# Patient Record
Sex: Female | Born: 1935 | Race: White | Hispanic: No | Smoking: Former smoker
Health system: Southern US, Community
[De-identification: ages and names within clinical notes are randomized; demographics above are authoritative.]

## PROBLEM LIST (undated history)

## (undated) DIAGNOSIS — I2510 Atherosclerotic heart disease of native coronary artery without angina pectoris: Secondary | ICD-10-CM

## (undated) DIAGNOSIS — G459 Transient cerebral ischemic attack, unspecified: Secondary | ICD-10-CM

## (undated) DIAGNOSIS — K279 Peptic ulcer, site unspecified, unspecified as acute or chronic, without hemorrhage or perforation: Secondary | ICD-10-CM

## (undated) DIAGNOSIS — K589 Irritable bowel syndrome without diarrhea: Secondary | ICD-10-CM

## (undated) DIAGNOSIS — R0789 Other chest pain: Secondary | ICD-10-CM

## (undated) DIAGNOSIS — S72001A Fracture of unspecified part of neck of right femur, initial encounter for closed fracture: Secondary | ICD-10-CM

## (undated) DIAGNOSIS — I501 Left ventricular failure: Secondary | ICD-10-CM

## (undated) DIAGNOSIS — M1990 Unspecified osteoarthritis, unspecified site: Secondary | ICD-10-CM

## (undated) DIAGNOSIS — I4891 Unspecified atrial fibrillation: Secondary | ICD-10-CM

## (undated) DIAGNOSIS — J449 Chronic obstructive pulmonary disease, unspecified: Secondary | ICD-10-CM

## (undated) DIAGNOSIS — I10 Essential (primary) hypertension: Secondary | ICD-10-CM

## (undated) DIAGNOSIS — R630 Anorexia: Secondary | ICD-10-CM

## (undated) DIAGNOSIS — I495 Sick sinus syndrome: Secondary | ICD-10-CM

## (undated) DIAGNOSIS — K625 Hemorrhage of anus and rectum: Secondary | ICD-10-CM

## (undated) DIAGNOSIS — H3530 Unspecified macular degeneration: Secondary | ICD-10-CM

## (undated) DIAGNOSIS — I5032 Chronic diastolic (congestive) heart failure: Secondary | ICD-10-CM

## (undated) DIAGNOSIS — K219 Gastro-esophageal reflux disease without esophagitis: Secondary | ICD-10-CM

## (undated) DIAGNOSIS — M8580 Other specified disorders of bone density and structure, unspecified site: Secondary | ICD-10-CM

## (undated) DIAGNOSIS — K2970 Gastritis, unspecified, without bleeding: Secondary | ICD-10-CM

## (undated) DIAGNOSIS — R609 Edema, unspecified: Secondary | ICD-10-CM

## (undated) DIAGNOSIS — E162 Hypoglycemia, unspecified: Secondary | ICD-10-CM

## (undated) DIAGNOSIS — R0600 Dyspnea, unspecified: Secondary | ICD-10-CM

## (undated) DIAGNOSIS — J4489 Other specified chronic obstructive pulmonary disease: Secondary | ICD-10-CM

## (undated) DIAGNOSIS — E538 Deficiency of other specified B group vitamins: Secondary | ICD-10-CM

## (undated) DIAGNOSIS — E785 Hyperlipidemia, unspecified: Secondary | ICD-10-CM

## (undated) DIAGNOSIS — I4892 Unspecified atrial flutter: Secondary | ICD-10-CM

## (undated) DIAGNOSIS — R627 Adult failure to thrive: Principal | ICD-10-CM

## (undated) DIAGNOSIS — R05 Cough: Secondary | ICD-10-CM

## (undated) HISTORY — DX: Other specified chronic obstructive pulmonary disease: J44.89

## (undated) HISTORY — DX: Unspecified atrial flutter: I48.92

## (undated) HISTORY — DX: Left ventricular failure, unspecified: I50.1

## (undated) HISTORY — DX: Fracture of unspecified part of neck of right femur, initial encounter for closed fracture: S72.001A

## (undated) HISTORY — DX: Sick sinus syndrome: I49.5

## (undated) HISTORY — PX: CATARACT EXTRACTION: SUR2

## (undated) HISTORY — PX: OTHER SURGICAL HISTORY: SHX169

## (undated) HISTORY — PX: HIP SURGERY: SHX245

## (undated) HISTORY — PX: ORIF HIP FRACTURE: SHX2125

## (undated) HISTORY — DX: Gastro-esophageal reflux disease without esophagitis: K21.9

## (undated) HISTORY — DX: Chronic diastolic (congestive) heart failure: I50.32

## (undated) HISTORY — DX: Chronic obstructive pulmonary disease, unspecified: J44.9

## (undated) HISTORY — DX: Other chest pain: R07.89

## (undated) HISTORY — DX: Irritable bowel syndrome, unspecified: K58.9

## (undated) HISTORY — PX: EYE SURGERY: SHX253

## (undated) HISTORY — DX: Anorexia: R63.0

## (undated) HISTORY — DX: Adult failure to thrive: R62.7

## (undated) HISTORY — DX: Hemorrhage of anus and rectum: K62.5

## (undated) HISTORY — PX: VARICOSE VEIN SURGERY: SHX832

## (undated) HISTORY — DX: Atherosclerotic heart disease of native coronary artery without angina pectoris: I25.10

## (undated) HISTORY — DX: Dyspnea, unspecified: R06.00

---

## 1992-05-14 DIAGNOSIS — G459 Transient cerebral ischemic attack, unspecified: Secondary | ICD-10-CM

## 1992-05-14 HISTORY — DX: Transient cerebral ischemic attack, unspecified: G45.9

## 2005-09-21 ENCOUNTER — Ambulatory Visit (HOSPITAL_COMMUNITY): Admission: RE | Admit: 2005-09-21 | Discharge: 2005-09-21 | Payer: Self-pay | Admitting: Internal Medicine

## 2005-09-21 ENCOUNTER — Encounter: Payer: Self-pay | Admitting: Vascular Surgery

## 2005-09-25 ENCOUNTER — Encounter: Admission: RE | Admit: 2005-09-25 | Discharge: 2005-09-25 | Payer: Self-pay | Admitting: Internal Medicine

## 2005-10-03 ENCOUNTER — Encounter: Admission: RE | Admit: 2005-10-03 | Discharge: 2005-10-03 | Payer: Self-pay | Admitting: *Deleted

## 2005-10-09 ENCOUNTER — Encounter (INDEPENDENT_AMBULATORY_CARE_PROVIDER_SITE_OTHER): Payer: Self-pay | Admitting: *Deleted

## 2005-10-09 ENCOUNTER — Ambulatory Visit (HOSPITAL_COMMUNITY): Admission: RE | Admit: 2005-10-09 | Discharge: 2005-10-09 | Payer: Self-pay | Admitting: *Deleted

## 2005-12-11 ENCOUNTER — Encounter (INDEPENDENT_AMBULATORY_CARE_PROVIDER_SITE_OTHER): Payer: Self-pay | Admitting: Specialist

## 2005-12-11 ENCOUNTER — Ambulatory Visit (HOSPITAL_COMMUNITY): Admission: RE | Admit: 2005-12-11 | Discharge: 2005-12-11 | Payer: Self-pay | Admitting: *Deleted

## 2006-01-24 ENCOUNTER — Encounter (INDEPENDENT_AMBULATORY_CARE_PROVIDER_SITE_OTHER): Payer: Self-pay | Admitting: Cardiology

## 2006-01-24 ENCOUNTER — Encounter (INDEPENDENT_AMBULATORY_CARE_PROVIDER_SITE_OTHER): Payer: Self-pay | Admitting: Neurology

## 2006-01-24 ENCOUNTER — Inpatient Hospital Stay (HOSPITAL_COMMUNITY): Admission: EM | Admit: 2006-01-24 | Discharge: 2006-01-28 | Payer: Self-pay | Admitting: Emergency Medicine

## 2006-05-29 ENCOUNTER — Ambulatory Visit (HOSPITAL_COMMUNITY): Admission: RE | Admit: 2006-05-29 | Discharge: 2006-05-29 | Payer: Self-pay | Admitting: Internal Medicine

## 2006-07-17 ENCOUNTER — Inpatient Hospital Stay (HOSPITAL_COMMUNITY): Admission: EM | Admit: 2006-07-17 | Discharge: 2006-07-18 | Payer: Self-pay | Admitting: Emergency Medicine

## 2006-07-17 ENCOUNTER — Ambulatory Visit: Payer: Self-pay | Admitting: Cardiology

## 2006-07-18 ENCOUNTER — Ambulatory Visit: Payer: Self-pay | Admitting: Vascular Surgery

## 2006-07-18 ENCOUNTER — Encounter: Payer: Self-pay | Admitting: Vascular Surgery

## 2006-07-18 ENCOUNTER — Encounter: Payer: Self-pay | Admitting: Cardiology

## 2006-10-24 ENCOUNTER — Inpatient Hospital Stay (HOSPITAL_COMMUNITY): Admission: EM | Admit: 2006-10-24 | Discharge: 2006-10-31 | Payer: Self-pay | Admitting: Emergency Medicine

## 2006-11-28 ENCOUNTER — Encounter (HOSPITAL_COMMUNITY): Admission: RE | Admit: 2006-11-28 | Discharge: 2006-12-16 | Payer: Self-pay | Admitting: Interventional Cardiology

## 2006-12-18 ENCOUNTER — Inpatient Hospital Stay (HOSPITAL_COMMUNITY): Admission: AD | Admit: 2006-12-18 | Discharge: 2006-12-25 | Payer: Self-pay | Admitting: Interventional Cardiology

## 2007-02-22 ENCOUNTER — Emergency Department (HOSPITAL_COMMUNITY): Admission: EM | Admit: 2007-02-22 | Discharge: 2007-02-22 | Payer: Self-pay | Admitting: Emergency Medicine

## 2007-05-31 ENCOUNTER — Emergency Department (HOSPITAL_COMMUNITY): Admission: EM | Admit: 2007-05-31 | Discharge: 2007-05-31 | Payer: Self-pay | Admitting: Emergency Medicine

## 2007-06-15 ENCOUNTER — Inpatient Hospital Stay (HOSPITAL_COMMUNITY): Admission: EM | Admit: 2007-06-15 | Discharge: 2007-06-16 | Payer: Self-pay | Admitting: Emergency Medicine

## 2007-07-04 ENCOUNTER — Inpatient Hospital Stay (HOSPITAL_COMMUNITY): Admission: EM | Admit: 2007-07-04 | Discharge: 2007-07-08 | Payer: Self-pay | Admitting: Emergency Medicine

## 2007-09-15 ENCOUNTER — Encounter: Admission: RE | Admit: 2007-09-15 | Discharge: 2007-09-15 | Payer: Self-pay | Admitting: Internal Medicine

## 2007-12-15 ENCOUNTER — Ambulatory Visit (HOSPITAL_COMMUNITY): Admission: RE | Admit: 2007-12-15 | Discharge: 2007-12-15 | Payer: Self-pay | Admitting: Internal Medicine

## 2008-02-12 ENCOUNTER — Inpatient Hospital Stay (HOSPITAL_COMMUNITY): Admission: EM | Admit: 2008-02-12 | Discharge: 2008-02-16 | Payer: Self-pay | Admitting: Emergency Medicine

## 2008-03-05 ENCOUNTER — Encounter (HOSPITAL_COMMUNITY): Admission: RE | Admit: 2008-03-05 | Discharge: 2008-05-13 | Payer: Self-pay | Admitting: Internal Medicine

## 2008-11-11 ENCOUNTER — Encounter: Admission: RE | Admit: 2008-11-11 | Discharge: 2008-11-11 | Payer: Self-pay | Admitting: Internal Medicine

## 2009-03-07 ENCOUNTER — Inpatient Hospital Stay (HOSPITAL_COMMUNITY): Admission: AD | Admit: 2009-03-07 | Discharge: 2009-03-08 | Payer: Self-pay | Admitting: Interventional Cardiology

## 2009-03-12 ENCOUNTER — Ambulatory Visit: Payer: Self-pay | Admitting: Internal Medicine

## 2009-03-12 ENCOUNTER — Inpatient Hospital Stay (HOSPITAL_COMMUNITY): Admission: EM | Admit: 2009-03-12 | Discharge: 2009-03-20 | Payer: Self-pay | Admitting: Emergency Medicine

## 2009-03-15 ENCOUNTER — Encounter: Payer: Self-pay | Admitting: Internal Medicine

## 2009-03-18 HISTORY — PX: PACEMAKER INSERTION: SHX728

## 2009-03-18 HISTORY — PX: CARDIAC CATHETERIZATION: SHX172

## 2009-03-22 ENCOUNTER — Encounter: Payer: Self-pay | Admitting: Internal Medicine

## 2009-03-22 DIAGNOSIS — Z950 Presence of cardiac pacemaker: Secondary | ICD-10-CM | POA: Insufficient documentation

## 2009-03-23 ENCOUNTER — Ambulatory Visit: Payer: Self-pay

## 2009-03-23 ENCOUNTER — Encounter: Payer: Self-pay | Admitting: Internal Medicine

## 2009-04-30 ENCOUNTER — Emergency Department (HOSPITAL_COMMUNITY): Admission: EM | Admit: 2009-04-30 | Discharge: 2009-04-30 | Payer: Self-pay | Admitting: Emergency Medicine

## 2009-06-15 DIAGNOSIS — I2510 Atherosclerotic heart disease of native coronary artery without angina pectoris: Secondary | ICD-10-CM

## 2009-06-15 DIAGNOSIS — I10 Essential (primary) hypertension: Secondary | ICD-10-CM | POA: Insufficient documentation

## 2009-06-15 DIAGNOSIS — I4891 Unspecified atrial fibrillation: Secondary | ICD-10-CM | POA: Insufficient documentation

## 2009-06-15 DIAGNOSIS — I495 Sick sinus syndrome: Secondary | ICD-10-CM

## 2009-06-15 HISTORY — DX: Atherosclerotic heart disease of native coronary artery without angina pectoris: I25.10

## 2009-06-15 HISTORY — DX: Sick sinus syndrome: I49.5

## 2009-06-25 ENCOUNTER — Emergency Department (HOSPITAL_COMMUNITY): Admission: EM | Admit: 2009-06-25 | Discharge: 2009-06-25 | Payer: Self-pay | Admitting: Emergency Medicine

## 2009-07-05 ENCOUNTER — Encounter (INDEPENDENT_AMBULATORY_CARE_PROVIDER_SITE_OTHER): Payer: Self-pay | Admitting: *Deleted

## 2009-12-09 ENCOUNTER — Encounter (INDEPENDENT_AMBULATORY_CARE_PROVIDER_SITE_OTHER): Payer: Self-pay | Admitting: *Deleted

## 2010-02-10 ENCOUNTER — Encounter (INDEPENDENT_AMBULATORY_CARE_PROVIDER_SITE_OTHER): Payer: Self-pay | Admitting: *Deleted

## 2010-06-03 ENCOUNTER — Encounter: Payer: Self-pay | Admitting: Internal Medicine

## 2010-06-04 ENCOUNTER — Encounter: Payer: Self-pay | Admitting: Orthopedic Surgery

## 2010-06-13 NOTE — Letter (Signed)
Summary: Device-Delinquent Check  [facility], Main Office  [address]   [place], [place] [address]   Phone: [phone]  Fax: [phone]     December 09, 2009 MRN: [id number]   [facility] [address] [place], [place]  [address]   Dear Ms. Jenison,  According to our records, you have not had your implanted device checked in the recommended period of time.  We are unable to determine appropriate device function without checking your device on a regular basis.  Please call our office to schedule an appointment as soon as possible.  If you are having your device checked by another physician, please call us so that we may update our records.  Thank you,   [facility]

## 2010-06-13 NOTE — Letter (Signed)
Summary: Appointment - Missed  [facility], Main Office  [address]   [place], [place] [address]   Phone: [phone]  Fax: [phone]     July 05, 2009 MRN: [id number]   [facility] [address] [place], [place]  [address]   Dear Ms. Kever,  Our records indicate you missed your appointment on 06/17/09 with Dr. Johney Frame. It is very important that we reach you to reschedule this appointment. We look forward to participating in your health care needs. Please contact us at the number listed above at your earliest convenience to reschedule this appointment.     Sincerely,   Ruel Favors Scheduling Team

## 2010-06-13 NOTE — Letter (Signed)
Summary: Device-Delinquent Check  [facility], Main Office  [address]   [place], [place] [address]   Phone: [phone]  Fax: [phone]     February 10, 2010 MRN: [id number]   [facility] [address] [place], [place]  [address]   Dear [name],  According to our records, you have not had your implanted device checked in the recommended period of time.  We are unable to determine appropriate device function without checking your device on a regular basis.  Please call our office to schedule an appointment with Dr Ladona Ridgel as soon as possible.  If you are having your device checked by another physician, please call us so that we may update our records.  Thank you,  Letta Moynahan, EMT  February 10, 2010 12:21 PM  [facility]  Certified

## 2010-06-13 NOTE — Cardiovascular Report (Signed)
Summary: Office Visit   Office Visit   Imported By: Roderic Ovens 05/26/2009 16:08:51  _____________________________________________________________________  External Attachment:    Type:   Image     Comment:   External Document

## 2010-07-20 ENCOUNTER — Emergency Department (HOSPITAL_COMMUNITY): Payer: Medicare Other

## 2010-07-20 ENCOUNTER — Inpatient Hospital Stay (HOSPITAL_COMMUNITY): Payer: Medicare Other

## 2010-07-20 ENCOUNTER — Inpatient Hospital Stay (HOSPITAL_COMMUNITY)
Admission: EM | Admit: 2010-07-20 | Discharge: 2010-07-22 | DRG: 872 | Disposition: A | Discharge status: Nursing Home (Includes ICF) | Payer: Medicare Other | Attending: Internal Medicine | Admitting: Internal Medicine

## 2010-07-20 DIAGNOSIS — E785 Hyperlipidemia, unspecified: Secondary | ICD-10-CM | POA: Diagnosis present

## 2010-07-20 DIAGNOSIS — Z9861 Coronary angioplasty status: Secondary | ICD-10-CM

## 2010-07-20 DIAGNOSIS — A419 Sepsis, unspecified organism: Principal | ICD-10-CM | POA: Diagnosis present

## 2010-07-20 DIAGNOSIS — J449 Chronic obstructive pulmonary disease, unspecified: Secondary | ICD-10-CM | POA: Diagnosis present

## 2010-07-20 DIAGNOSIS — K2970 Gastritis, unspecified, without bleeding: Secondary | ICD-10-CM | POA: Diagnosis present

## 2010-07-20 DIAGNOSIS — Z7901 Long term (current) use of anticoagulants: Secondary | ICD-10-CM

## 2010-07-20 DIAGNOSIS — F411 Generalized anxiety disorder: Secondary | ICD-10-CM | POA: Diagnosis present

## 2010-07-20 DIAGNOSIS — I4891 Unspecified atrial fibrillation: Secondary | ICD-10-CM | POA: Diagnosis present

## 2010-07-20 DIAGNOSIS — N390 Urinary tract infection, site not specified: Secondary | ICD-10-CM | POA: Diagnosis present

## 2010-07-20 DIAGNOSIS — Z950 Presence of cardiac pacemaker: Secondary | ICD-10-CM

## 2010-07-20 DIAGNOSIS — D696 Thrombocytopenia, unspecified: Secondary | ICD-10-CM | POA: Diagnosis present

## 2010-07-20 DIAGNOSIS — G894 Chronic pain syndrome: Secondary | ICD-10-CM | POA: Diagnosis present

## 2010-07-20 DIAGNOSIS — I10 Essential (primary) hypertension: Secondary | ICD-10-CM | POA: Diagnosis present

## 2010-07-20 DIAGNOSIS — J4489 Other specified chronic obstructive pulmonary disease: Secondary | ICD-10-CM | POA: Diagnosis present

## 2010-07-20 DIAGNOSIS — I2510 Atherosclerotic heart disease of native coronary artery without angina pectoris: Secondary | ICD-10-CM | POA: Diagnosis present

## 2010-07-20 DIAGNOSIS — K219 Gastro-esophageal reflux disease without esophagitis: Secondary | ICD-10-CM | POA: Diagnosis present

## 2010-07-20 DIAGNOSIS — Z8673 Personal history of transient ischemic attack (TIA), and cerebral infarction without residual deficits: Secondary | ICD-10-CM

## 2010-07-20 DIAGNOSIS — R079 Chest pain, unspecified: Secondary | ICD-10-CM | POA: Diagnosis present

## 2010-07-20 LAB — BASIC METABOLIC PANEL
BUN: 18 mg/dL (ref 6–23)
CO2: 27 mEq/L (ref 19–32)
Chloride: 103 mEq/L (ref 96–112)
Creatinine, Ser: 0.69 mg/dL (ref 0.4–1.2)
Glucose, Bld: 138 mg/dL — ABNORMAL HIGH (ref 70–99)

## 2010-07-20 LAB — CBC
MCH: 31.4 pg (ref 26.0–34.0)
MCHC: 32.5 g/dL (ref 30.0–36.0)
Platelets: 146 10*3/uL — ABNORMAL LOW (ref 150–400)
RDW: 12.9 % (ref 11.5–15.5)

## 2010-07-20 LAB — URINALYSIS, ROUTINE W REFLEX MICROSCOPIC
Bilirubin Urine: NEGATIVE
Glucose, UA: NEGATIVE mg/dL
Nitrite: POSITIVE — AB
Specific Gravity, Urine: 1.023 (ref 1.005–1.030)
pH: 7 (ref 5.0–8.0)

## 2010-07-20 LAB — DIFFERENTIAL
Basophils Absolute: 0 10*3/uL (ref 0.0–0.1)
Basophils Relative: 0 % (ref 0–1)
Eosinophils Absolute: 0 10*3/uL (ref 0.0–0.7)
Monocytes Absolute: 0.4 10*3/uL (ref 0.1–1.0)
Monocytes Relative: 6 % (ref 3–12)

## 2010-07-20 LAB — PROCALCITONIN: Procalcitonin: 0.27 ng/mL

## 2010-07-20 LAB — URINE MICROSCOPIC-ADD ON

## 2010-07-20 LAB — POCT CARDIAC MARKERS

## 2010-07-20 LAB — PROTIME-INR: INR: 2.24 — ABNORMAL HIGH (ref 0.00–1.49)

## 2010-07-20 MED ORDER — IOHEXOL 300 MG/ML  SOLN
50.0000 mL | Freq: Once | INTRAMUSCULAR | Status: AC | PRN
  Administered 2010-07-20: 50 mL via INTRAVENOUS

## 2010-07-21 LAB — BASIC METABOLIC PANEL
Calcium: 8.8 mg/dL (ref 8.4–10.5)
Creatinine, Ser: 0.65 mg/dL (ref 0.4–1.2)
GFR calc Af Amer: >60 mL/min (ref >60)
GFR calc non Af Amer: >60 mL/min (ref >60)

## 2010-07-21 LAB — CBC
HCT: 37.2 % (ref 36.0–46.0)
Platelets: 134 10*3/uL — ABNORMAL LOW (ref 150–400)
RDW: 13.3 % (ref 11.5–15.5)
WBC: 5.3 10*3/uL (ref 4.0–10.5)

## 2010-07-21 LAB — CARDIAC PANEL(CRET KIN+CKTOT+MB+TROPI)
Total CK: 139 U/L (ref 7–177)
Troponin I: 0.03 ng/mL (ref 0.00–0.06)

## 2010-07-21 LAB — URINE CULTURE
Colony Count: >=100000
Culture  Setup Time: 201203081603

## 2010-07-21 LAB — PROTIME-INR: INR: 2.28 — ABNORMAL HIGH (ref 0.00–1.49)

## 2010-07-21 NOTE — Consult Note (Signed)
[name], RILING NO.:  [id number]  MEDICAL RECORD NO.:  [id number]           PATIENT TYPE:  E  LOCATION:  [facility]                         FACILITY:  [facility]  PHYSICIAN:  Vania Rea, M.D. DATE OF BIRTH:  1916-01-09  DATE OF CONSULTATION: DATE OF DISCHARGE:                                CONSULTATION   PRIMARY CARE PHYSICIANS:  Soyla Murphy. Renne Crigler, MD. Lyn Records, MD. with [facility] cardiologist.  CHIEF COMPLAINT:  Chest discomfort and shortness of breath.  HISTORY OF PRESENT ILLNESS:  This is a 77 year old Caucasian lady with a history of coronary artery disease, sinoatrial dysfunction status post pacemaker placement, who also has chronic atrial fibrillation on Coumadin and extended release metoprolol, lives at an assisted living facility and has been in fair health, ambulates with the assistance of a walker, but since yesterday has noted that she does not feel quite right.  She was somewhat short of breath, but other than that could not locate any specific complaints.  She had no fever nor chest pains.  This morning, the patient said she woke up with central chest burning and felt much worse than yesterday.  The burning was located retrosternally, did not radiate, it was not associated with nausea or vomiting. However, she called the nurse, who remarked that she had a high fever and did not look well and the patient was transported to the [facility] Emergency Room for further evaluation.  The patient is on chronic Coumadin for her chronic atrial fibrillation. She has had no nausea or vomiting.  No diarrhea or constipation.  Since arriving to the hospital, she has had something to eat and says she feels much better.  The patient has chronic left lower extremity edema status post vein stripping for varicose veins in her left leg and reports has been no change in the left leg edema, but she has noted increased varicose veins in her right leg for the past  few days, but no swelling.  PAST MEDICAL HISTORY: 1. Sinoatrial dysfunction status post pacemaker placement. 2. Coronary artery disease status post stenting. 3. Chronic atrial fibrillation. 4. Hypertension. 5. History of TIA. 6. History of COPD. 7. History of B12 deficiency. 8. GERD. 9. Hyperlipidemia.  MEDICATIONS: 1. Warfarin 5 mg half to 1 tablet daily as per schedule. 2. Vitamin B12 by injection 15th of each month. 3. Chloraseptic lozenges every 2 hours when necessary for sore throat. 4. Tylenol 650 mg every 4 hours as needed. 5. Tramadol 50 mg every 8 hours as needed for pain. 6. Ventolin by inhaler 2 puffs every 4 hours as needed for shortness     of breath. 7. Sublingual nitroglycerin p.r.n. for chest pain. 8. Advair Diskus 100/50 one puff twice daily. 9. Multivitamins daily. 10.Calcium with vitamin D twice daily. 11.Vitamin D 1000 units twice daily. 12.Singulair 10 mg daily. 13.MiraLax 17 grams daily when necessary. 14.Toprol-XL 100 mg one and half tablet each morning. 15.Atorvastatin 10 mg each morning. 16.Omeprazole 20 mg each morning. 17.Tramadol extended release 100 mg each morning.  ALLERGIES:  TO SULFA, DEMEROL, AMPICILLIN, AND CIPRO.  SOCIAL HISTORY:  No tobacco, alcohol, or illicit  drug use.  She quit tobacco nearly 20 years ago.  She has very supportive children and in fact her son who is healthcare power of attorney is, Jamison Yuhasz, telephone number [phone].  Code status is do not resuscitate.  FAMILY HISTORY:  Unchanged from previous admissions.  REVIEW OF SYSTEMS:  Other than noted above unremarkable.  PHYSICAL EXAM:  GENERAL:  Elderly Caucasian lady, sitting up in bed, somewhat hard of hearing, looks much younger than her stated age. VITALS:  Temperature is 99.2, pulse of 78, respirations 17, blood pressure currently 93/43.  She is saturating 100% on 2 L. HEENT:  Her pupils are round and equal.  Mucous membranes pink, anicteric. NECK:  No  cervical lymphadenopathy nor thyromegaly. CHEST:  She has coarse crackles on the right base. CARDIOVASCULAR SYSTEM:  She has a regular paced rhythm.  Occasional errand beats. ABDOMEN:  Obese, soft, and nontender.  No masses.  She does have abdominal surgical scars and appears to have umbilical hernia surgery. EXTREMITIES:  She has 1+ edema in the left lower extremity and arthritic deformities of the hands, knees, and ankles.  She has no edema of the right lower extremity.  She has marked distended veins in the right leg. SKIN: Clammy, moist, no ulceration or rash. CENTRAL NERVOUS SYSTEM:  Apart from being hard of hearing in the left ear, cranial nerves II through XII are grossly intact and she has no focal lateralizing signs.  LABORATORY DATA:  Her white count is 7.1, hemoglobin 13.6, platelets 146.  She has 86% neutrophils, but absolute neutrophil count is normal at 6.1.  Her sodium is 137, potassium 3.9, chloride 103, CO2 of 27, glucose 138, BUN 18, creatinine 0.7, calcium 9.2.  Her INR is therapeutic at 2.4.  Her cardiac markers are completely normal with undetectable CK-MB and troponins with the myoglobin of 58.  Urinalysis shows specific gravity of 10.02.  The urine is cloudy.  She has 15 ketones, nitrite positive, small amount of leukocyte esterase.  She has no white cells and few bacteria.  Portable chest x-ray shows a moderate apparent cardiomegaly without focal acute findings.  Her EKG actually shows a sinus rhythm without any ST-segment evidence of ischemia.  ASSESSMENT: 1. SIRS versus occult sepsis. Although patient has no white count, she     does have fever, she is very clammy, she is somewhat hypotensive     and her urine is questionable suggestive of urinary tract     infection.  Clinically, she does have crackles in the right base,     but her chest x-ray does not confirm a pneumonia. 2. Coronary artery disease. 3. Atrial fibrillation. 4. Sinoatrial dysfunction  status post pacemaker placement. 5. Gastroesophageal reflux disease. 6. Chronic obstructive pulmonary disease. 7. B12 deficiency. 8. Hyperlipidemia. 9. Possible urinary tract infection.  PLAN: 1. We will give IV fluid and antibiotic support. Will check procalcitonin      and lactic acid, and we will go ahead and get a CT angiogram of the     chest, which help [place] to rule out pneumonia or atypical presentation     of a pulmonary embolus.  We will otherwise continue her home     medications.  If her lactic acid is normal, procalcitonin     confirmed to be negative, and CT angiogram is not indicative of any     serious pathology, then we will continue to manage her on telemetry. 2. Other plans as per orders.     Thayer Ohm  Orvan Falconer, M.D.     LC/MEDQ  D:  07/20/2010  T:  07/20/2010  Job:  [id number]  cc:   Soyla Murphy. Renne Crigler, M.D. Lyn Records, M.D.  Electronically Signed by Vania Rea M.D. on 07/20/2010 11:58:46 PM

## 2010-07-23 NOTE — Discharge Summary (Signed)
[name], [name]             ACCOUNT NO.:  [id number]  MEDICAL RECORD NO.:  [id number]           PATIENT TYPE:  I  LOCATION:  3702                         FACILITY:  [facility]  PHYSICIAN:  [name], M.D.       DATE OF BIRTH:  April 30, 1916  DATE OF ADMISSION:  07/20/2010 DATE OF DISCHARGE:  07/22/2010                              DISCHARGE SUMMARY   PRIMARY CARE PHYSICIAN:  Leslie Murphy. Leslie Crigler, MD  DISCHARGE DIAGNOSES: 1. Sepsis, resolved. 2. Urinary tract infection, improved. 3. Gastroesophageal reflux disease. 4. Sick sinus syndrome status post pacemaker placed. 5. Coronary artery disease without evidence for ongoing ischemic     disease status post bare-metal stent to the right coronary artery     in 2008 and stenting of left anterior descending with a drug-     eluting stent in 2009, and left anterior descending bare-metal     stent in 2008. 6. Chronic obstructive pulmonary disease. 7. Anxiety. 8. Severe bilateral knee osteoarthritis with chronic pain syndrome. 9. B12 deficiency. 10.Chronic anticoagulation for atrial fibrillation predated a sick     sinus syndrome and a pacemaker placement. 11.Hyperlipidemia. 12.Mild thrombocytopenia.  DISCHARGE MEDICATIONS: 1. Omeprazole 20 mg by mouth twice a day. 2. Cefuroxime 500 mg by mouth twice a day with meals for 5 days. 3. Advair Diskus 100/50 one puff inhaled twice a day. 4. Lipitor 10 mg daily. 5. Calcium with vitamin D 600/400 mg 1 tablet twice a day. 6. Vitamins 1 tablet daily. 7. Chloraseptic one lozenge by mouth every 2 hours as needed. 8. Vitamin B12 one ejection subcutaneously monthly. 9. ICaps OTC 1 tablet mouth twice a day. 10.Metoprolol extended release 150 mg daily. 11.Nitroglycerin 0.4 mg under the tongue every 5 minutes as needed for     chest pain. 12.MiraLax 17 g daily. 13.Omega fatty acids 30 mg by mouth twice a day. 14.Singulair 10 mg twice daily. 15.Tramadol extended release 100 mg every  morning. 16.Tylenol 650 mg by mouth every 4 hours as needed for pain. 17.Tylenol Extra Strength 500 mg 2 capsules by mouth three times a day     as needed. 18.Ultram 50 mg every 8 hours as needed for pain. 19.Albuterol 90 mcg 2 puffs inhaled every 4 hours as needed for     shortness of breath. 20.Vitamin D 1000 units by mouth twice a day. 21.Coumadin 5 mg Tuesday, Thursday, Saturday, and Sunday and 2.5 mg     Monday, Wednesday, and Friday.  CONDITION ON DISCHARGE:  [name] was discharged in good condition, alert, oriented in no acute distress, able to return to assisted living friend's home.  She will follow up with Dr. Merri [name] next week.  CONSULTATION THIS ADMISSION:  The patient was seen in evaluation by the physical therapist and occupational therapist and felt that she could return to assisted living facility.  HISTORY AND PHYSICAL:  Refer to dictated H and P, which was done by [name] on July 20, 2010.  HOSPITAL COURSE:  [name], a 77 year old woman with multiple medical problems was admitted from the emergency room with severe chest pain, sepsis-like symptoms, nausea, urinary tract infection.  The patient was placed on telemetry floor, started on intravenous fluids, intravenous antibiotics with [name], and her cardiac rhythm and cardiac enzymes were closely monitored.  The patient was noted to be in a paced rhythm throughout this admission and her cardiac enzymes at 24 hours after admission returned back completely to normal with troponin 0.03 and CK of 139.  The patient improved and by hospital day #2 was able to eat a regular diet.  She was still having some mild chest discomfort.  We felt that her chest discomfort was due to acid reflux exacerbating in the setting of this sepsis urinary tract infection.  [name] was evaluated by the physical therapist and occupational therapist and felt to be functional back to her level of assisted  living facility.  We noted that the patient's Coumadin was therapeutic on admission and throughout this admission and we have not made any adjustment in the Coumadin dosages.  Otherwise, [name] will return back to assisted living to complete 5 more days of oral antibiotics for urinary tract infection and on double dose of proton pump inhibitor, which seemed to have relieved her chest pain symptoms. Upon carefully discussing the situation with [name], I feel that some times she may have panic attacks and anxiety spells.  She though refused treatment with an antidepressant or an anxiolytic.  I encouraged her to discuss her symptoms with her primary care physician upon discharge.     [name], M.D.     SL/MEDQ  D:  07/22/2010  T:  07/22/2010  Job:  [id number]  cc:   [name], M.D. Lyn Records, M.D.  Electronically Signed by [name] M.D. on 07/23/2010 08:56:54 AM

## 2010-08-02 LAB — DIFFERENTIAL
Basophils Absolute: 0 10*3/uL (ref 0.0–0.1)
Basophils Relative: 0 % (ref 0–1)
Eosinophils Relative: 1 % (ref 0–5)
Monocytes Absolute: 0.6 10*3/uL (ref 0.1–1.0)
Monocytes Relative: 8 % (ref 3–12)
Neutro Abs: 5.5 10*3/uL (ref 1.7–7.7)

## 2010-08-02 LAB — CBC
HCT: 35.6 % — ABNORMAL LOW (ref 36.0–46.0)
Hemoglobin: 11.9 g/dL — ABNORMAL LOW (ref 12.0–15.0)
MCHC: 33.3 g/dL (ref 30.0–36.0)
RDW: 14 % (ref 11.5–15.5)

## 2010-08-02 LAB — HEMOCCULT GUIAC POC 1CARD (OFFICE): Fecal Occult Bld: POSITIVE

## 2010-08-02 LAB — COMPREHENSIVE METABOLIC PANEL
AST: 22 U/L (ref 0–37)
Albumin: 3.1 g/dL — ABNORMAL LOW (ref 3.5–5.2)
Alkaline Phosphatase: 60 U/L (ref 39–117)
Chloride: 107 mEq/L (ref 96–112)
GFR calc Af Amer: >60 mL/min (ref >60)
Potassium: 4.1 mEq/L (ref 3.5–5.1)
Sodium: 139 mEq/L (ref 135–145)
Total Bilirubin: 0.9 mg/dL (ref 0.3–1.2)
Total Protein: 6.6 g/dL (ref 6.0–8.3)

## 2010-08-02 LAB — APTT: aPTT: 51 seconds — ABNORMAL HIGH (ref 24–37)

## 2010-08-14 LAB — URINE CULTURE

## 2010-08-14 LAB — CBC
HCT: 34.8 % — ABNORMAL LOW (ref 36.0–46.0)
Hemoglobin: 11.3 g/dL — ABNORMAL LOW (ref 12.0–15.0)
MCHC: 32.4 g/dL (ref 30.0–36.0)
RBC: 3.72 MIL/uL — ABNORMAL LOW (ref 3.87–5.11)
RDW: 16 % — ABNORMAL HIGH (ref 11.5–15.5)

## 2010-08-14 LAB — DIFFERENTIAL
Basophils Absolute: 0 10*3/uL (ref 0.0–0.1)
Basophils Relative: 0 % (ref 0–1)
Eosinophils Absolute: 0 10*3/uL (ref 0.0–0.7)
Neutro Abs: 5.4 10*3/uL (ref 1.7–7.7)
Neutrophils Relative %: 77 % (ref 43–77)

## 2010-08-14 LAB — URINALYSIS, ROUTINE W REFLEX MICROSCOPIC
Nitrite: POSITIVE — AB
Protein, ur: 30 mg/dL — AB
Specific Gravity, Urine: 1.025 (ref 1.005–1.030)
Urobilinogen, UA: 1 mg/dL (ref 0.0–1.0)

## 2010-08-14 LAB — COMPREHENSIVE METABOLIC PANEL
ALT: 30 U/L (ref 0–35)
Alkaline Phosphatase: 58 U/L (ref 39–117)
BUN: 18 mg/dL (ref 6–23)
CO2: 24 mEq/L (ref 19–32)
GFR calc non Af Amer: >60 mL/min (ref >60)
Glucose, Bld: 143 mg/dL — ABNORMAL HIGH (ref 70–99)
Potassium: 3.8 mEq/L (ref 3.5–5.1)
Sodium: 139 mEq/L (ref 135–145)

## 2010-08-14 LAB — URINE MICROSCOPIC-ADD ON

## 2010-08-14 LAB — CULTURE, BLOOD (ROUTINE X 2): Culture: NO GROWTH

## 2010-08-16 LAB — PROTIME-INR
INR: 1.26 (ref 0.00–1.49)
INR: 1.31 (ref 0.00–1.49)
INR: 1.4 (ref 0.00–1.49)
INR: 1.58 — ABNORMAL HIGH (ref 0.00–1.49)
INR: 1.86 — ABNORMAL HIGH (ref 0.00–1.49)
Prothrombin Time: 15.7 seconds — ABNORMAL HIGH (ref 11.6–15.2)
Prothrombin Time: 16.2 seconds — ABNORMAL HIGH (ref 11.6–15.2)
Prothrombin Time: 20.8 seconds — ABNORMAL HIGH (ref 11.6–15.2)
Prothrombin Time: 21.3 seconds — ABNORMAL HIGH (ref 11.6–15.2)

## 2010-08-16 LAB — BASIC METABOLIC PANEL
BUN: 14 mg/dL (ref 6–23)
CO2: 25 mEq/L (ref 19–32)
CO2: 25 mEq/L (ref 19–32)
Calcium: 10 mg/dL (ref 8.4–10.5)
Calcium: 9.6 mg/dL (ref 8.4–10.5)
Chloride: 104 mEq/L (ref 96–112)
Creatinine, Ser: 0.78 mg/dL (ref 0.4–1.2)
Creatinine, Ser: 0.96 mg/dL (ref 0.4–1.2)
GFR calc Af Amer: >60 mL/min (ref >60)
GFR calc Af Amer: >60 mL/min (ref >60)
GFR calc non Af Amer: >60 mL/min (ref >60)
Glucose, Bld: 125 mg/dL — ABNORMAL HIGH (ref 70–99)
Sodium: 140 mEq/L (ref 135–145)

## 2010-08-16 LAB — CBC
HCT: 36.2 % (ref 36.0–46.0)
HCT: 36.7 % (ref 36.0–46.0)
HCT: 36.8 % (ref 36.0–46.0)
Hemoglobin: 12.4 g/dL (ref 12.0–15.0)
Hemoglobin: 12.4 g/dL (ref 12.0–15.0)
Hemoglobin: 12.5 g/dL (ref 12.0–15.0)
Hemoglobin: 12.7 g/dL (ref 12.0–15.0)
Hemoglobin: 13.3 g/dL (ref 12.0–15.0)
MCHC: 33.6 g/dL (ref 30.0–36.0)
MCHC: 33.8 g/dL (ref 30.0–36.0)
MCHC: 33.8 g/dL (ref 30.0–36.0)
MCHC: 33.9 g/dL (ref 30.0–36.0)
MCHC: 34 g/dL (ref 30.0–36.0)
MCV: 91.4 fL (ref 78.0–100.0)
MCV: 91.6 fL (ref 78.0–100.0)
MCV: 92.5 fL (ref 78.0–100.0)
Platelets: 176 10*3/uL (ref 150–400)
Platelets: 178 10*3/uL (ref 150–400)
Platelets: 189 10*3/uL (ref 150–400)
RBC: 3.74 MIL/uL — ABNORMAL LOW (ref 3.87–5.11)
RBC: 3.81 MIL/uL — ABNORMAL LOW (ref 3.87–5.11)
RBC: 3.96 MIL/uL (ref 3.87–5.11)
RBC: 4.02 MIL/uL (ref 3.87–5.11)
RBC: 4.09 MIL/uL (ref 3.87–5.11)
RDW: 14.8 % (ref 11.5–15.5)
RDW: 14.9 % (ref 11.5–15.5)
RDW: 15 % (ref 11.5–15.5)
RDW: 15.1 % (ref 11.5–15.5)
RDW: 15.1 % (ref 11.5–15.5)
RDW: 15.3 % (ref 11.5–15.5)
WBC: 8.7 10*3/uL (ref 4.0–10.5)

## 2010-08-16 LAB — GLUCOSE, CAPILLARY
Glucose-Capillary: 101 mg/dL — ABNORMAL HIGH (ref 70–99)
Glucose-Capillary: 104 mg/dL — ABNORMAL HIGH (ref 70–99)
Glucose-Capillary: 104 mg/dL — ABNORMAL HIGH (ref 70–99)
Glucose-Capillary: 130 mg/dL — ABNORMAL HIGH (ref 70–99)

## 2010-08-17 LAB — CARDIAC PANEL(CRET KIN+CKTOT+MB+TROPI)
CK, MB: 0.7 ng/mL (ref 0.3–4.0)
Relative Index: INVALID (ref 0.0–2.5)
Relative Index: INVALID (ref 0.0–2.5)
Relative Index: INVALID (ref 0.0–2.5)
Total CK: 30 U/L (ref 7–177)
Total CK: 31 U/L (ref 7–177)
Total CK: 36 U/L (ref 7–177)
Troponin I: 0.02 ng/mL (ref 0.00–0.06)

## 2010-08-17 LAB — COMPREHENSIVE METABOLIC PANEL
ALT: 28 U/L (ref 0–35)
ALT: 36 U/L — ABNORMAL HIGH (ref 0–35)
Albumin: 3.3 g/dL — ABNORMAL LOW (ref 3.5–5.2)
Alkaline Phosphatase: 39 U/L (ref 39–117)
Alkaline Phosphatase: 42 U/L (ref 39–117)
BUN: 15 mg/dL (ref 6–23)
BUN: 23 mg/dL (ref 6–23)
CO2: 23 mEq/L (ref 19–32)
Calcium: 8.5 mg/dL (ref 8.4–10.5)
Calcium: 9.2 mg/dL (ref 8.4–10.5)
GFR calc non Af Amer: >60 mL/min (ref >60)
Glucose, Bld: 139 mg/dL — ABNORMAL HIGH (ref 70–99)
Glucose, Bld: 147 mg/dL — ABNORMAL HIGH (ref 70–99)
Potassium: 4.2 mEq/L (ref 3.5–5.1)
Sodium: 139 mEq/L (ref 135–145)
Sodium: 140 mEq/L (ref 135–145)
Total Protein: 5.8 g/dL — ABNORMAL LOW (ref 6.0–8.3)

## 2010-08-17 LAB — CBC
HCT: 34.4 % — ABNORMAL LOW (ref 36.0–46.0)
HCT: 36.2 % (ref 36.0–46.0)
Hemoglobin: 11.5 g/dL — ABNORMAL LOW (ref 12.0–15.0)
Hemoglobin: 12.2 g/dL (ref 12.0–15.0)
MCHC: 33.8 g/dL (ref 30.0–36.0)
MCHC: 34 g/dL (ref 30.0–36.0)
MCHC: 34.3 g/dL (ref 30.0–36.0)
MCV: 91.2 fL (ref 78.0–100.0)
Platelets: 164 10*3/uL (ref 150–400)
RBC: 3.77 MIL/uL — ABNORMAL LOW (ref 3.87–5.11)
RBC: 3.93 MIL/uL (ref 3.87–5.11)
RDW: 14.9 % (ref 11.5–15.5)
RDW: 15.3 % (ref 11.5–15.5)
WBC: 4.9 10*3/uL (ref 4.0–10.5)

## 2010-08-17 LAB — APTT: aPTT: 33 seconds (ref 24–37)

## 2010-08-17 LAB — DIFFERENTIAL
Basophils Absolute: 0 10*3/uL (ref 0.0–0.1)
Basophils Relative: 0 % (ref 0–1)
Basophils Relative: 0 % (ref 0–1)
Eosinophils Absolute: 0 10*3/uL (ref 0.0–0.7)
Lymphs Abs: 1.4 10*3/uL (ref 0.7–4.0)
Monocytes Absolute: 0.9 10*3/uL (ref 0.1–1.0)
Neutro Abs: 3.8 10*3/uL (ref 1.7–7.7)
Neutro Abs: 4 10*3/uL (ref 1.7–7.7)
Neutrophils Relative %: 61 % (ref 43–77)
Neutrophils Relative %: 66 % (ref 43–77)

## 2010-08-17 LAB — CK TOTAL AND CKMB (NOT AT ARMC)
CK, MB: 1.1 ng/mL (ref 0.3–4.0)
Relative Index: INVALID (ref 0.0–2.5)
Total CK: 34 U/L (ref 7–177)

## 2010-08-17 LAB — BASIC METABOLIC PANEL
BUN: 19 mg/dL (ref 6–23)
CO2: 25 mEq/L (ref 19–32)
Calcium: 8.7 mg/dL (ref 8.4–10.5)
Creatinine, Ser: 0.83 mg/dL (ref 0.4–1.2)
Glucose, Bld: 71 mg/dL (ref 70–99)

## 2010-08-17 LAB — URINALYSIS, ROUTINE W REFLEX MICROSCOPIC
Bilirubin Urine: NEGATIVE
Ketones, ur: NEGATIVE mg/dL
Leukocytes, UA: NEGATIVE
Nitrite: NEGATIVE
Protein, ur: NEGATIVE mg/dL
Urobilinogen, UA: 0.2 mg/dL (ref 0.0–1.0)

## 2010-08-17 LAB — PROTIME-INR
INR: 1.49 (ref 0.00–1.49)
INR: 1.59 — ABNORMAL HIGH (ref 0.00–1.49)
INR: 1.71 — ABNORMAL HIGH (ref 0.00–1.49)
Prothrombin Time: 17.9 seconds — ABNORMAL HIGH (ref 11.6–15.2)
Prothrombin Time: 18.8 seconds — ABNORMAL HIGH (ref 11.6–15.2)
Prothrombin Time: 19.9 seconds — ABNORMAL HIGH (ref 11.6–15.2)
Prothrombin Time: 20.1 seconds — ABNORMAL HIGH (ref 11.6–15.2)

## 2010-08-17 LAB — TSH: TSH: 0.805 u[IU]/mL (ref 0.350–4.500)

## 2010-08-17 LAB — BRAIN NATRIURETIC PEPTIDE: Pro B Natriuretic peptide (BNP): 143 pg/mL — ABNORMAL HIGH (ref 0.0–100.0)

## 2010-08-17 LAB — TROPONIN I: Troponin I: 0.02 ng/mL (ref 0.00–0.06)

## 2010-09-17 IMAGING — CR DG CHEST 2V
2 series · 2 of 2 positions shown · non-contrast
Comparison: 03/12/2009.

CLINICAL DATA: Pacemaker placement.

CHEST - 2 VIEW

[w chest pa]
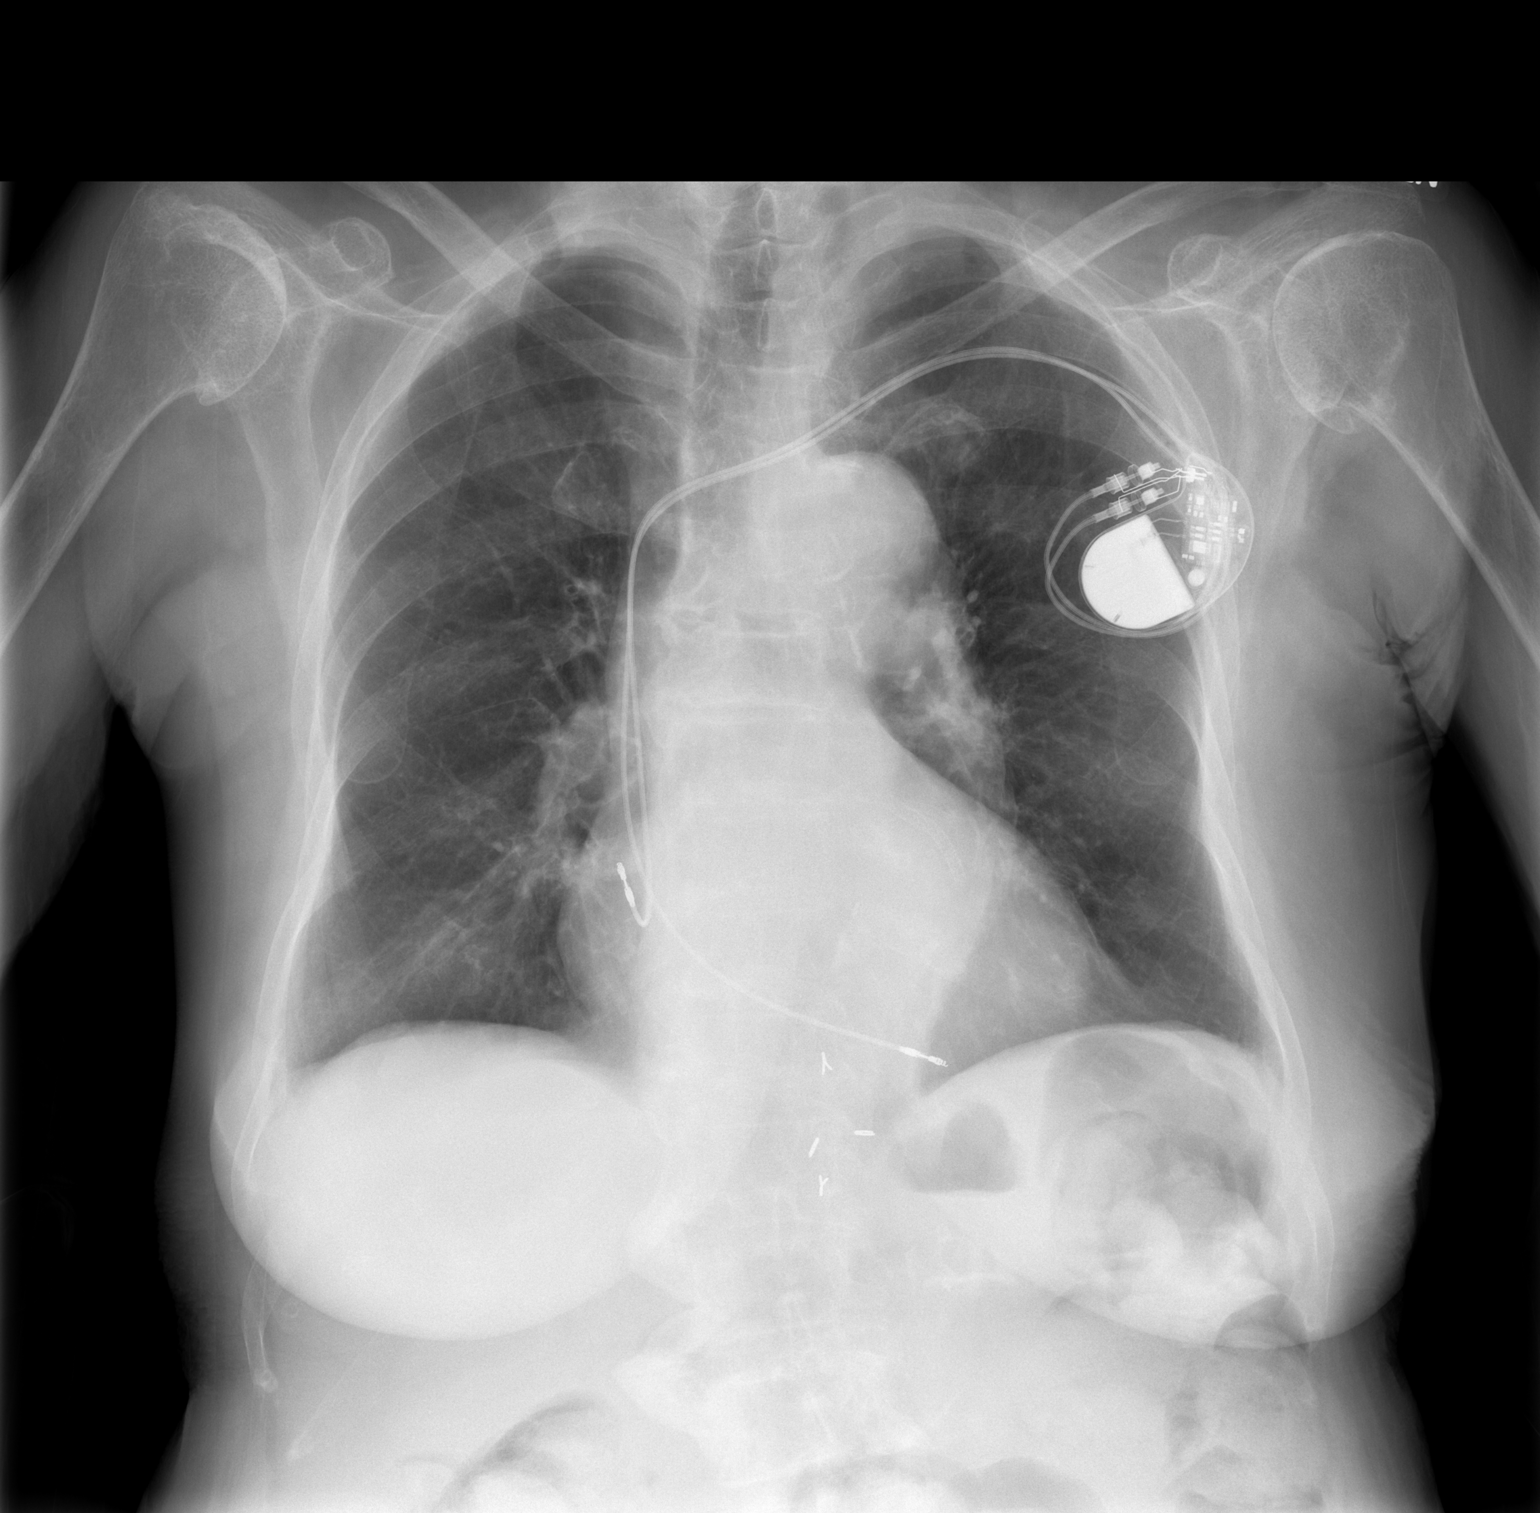

[w chest lat]
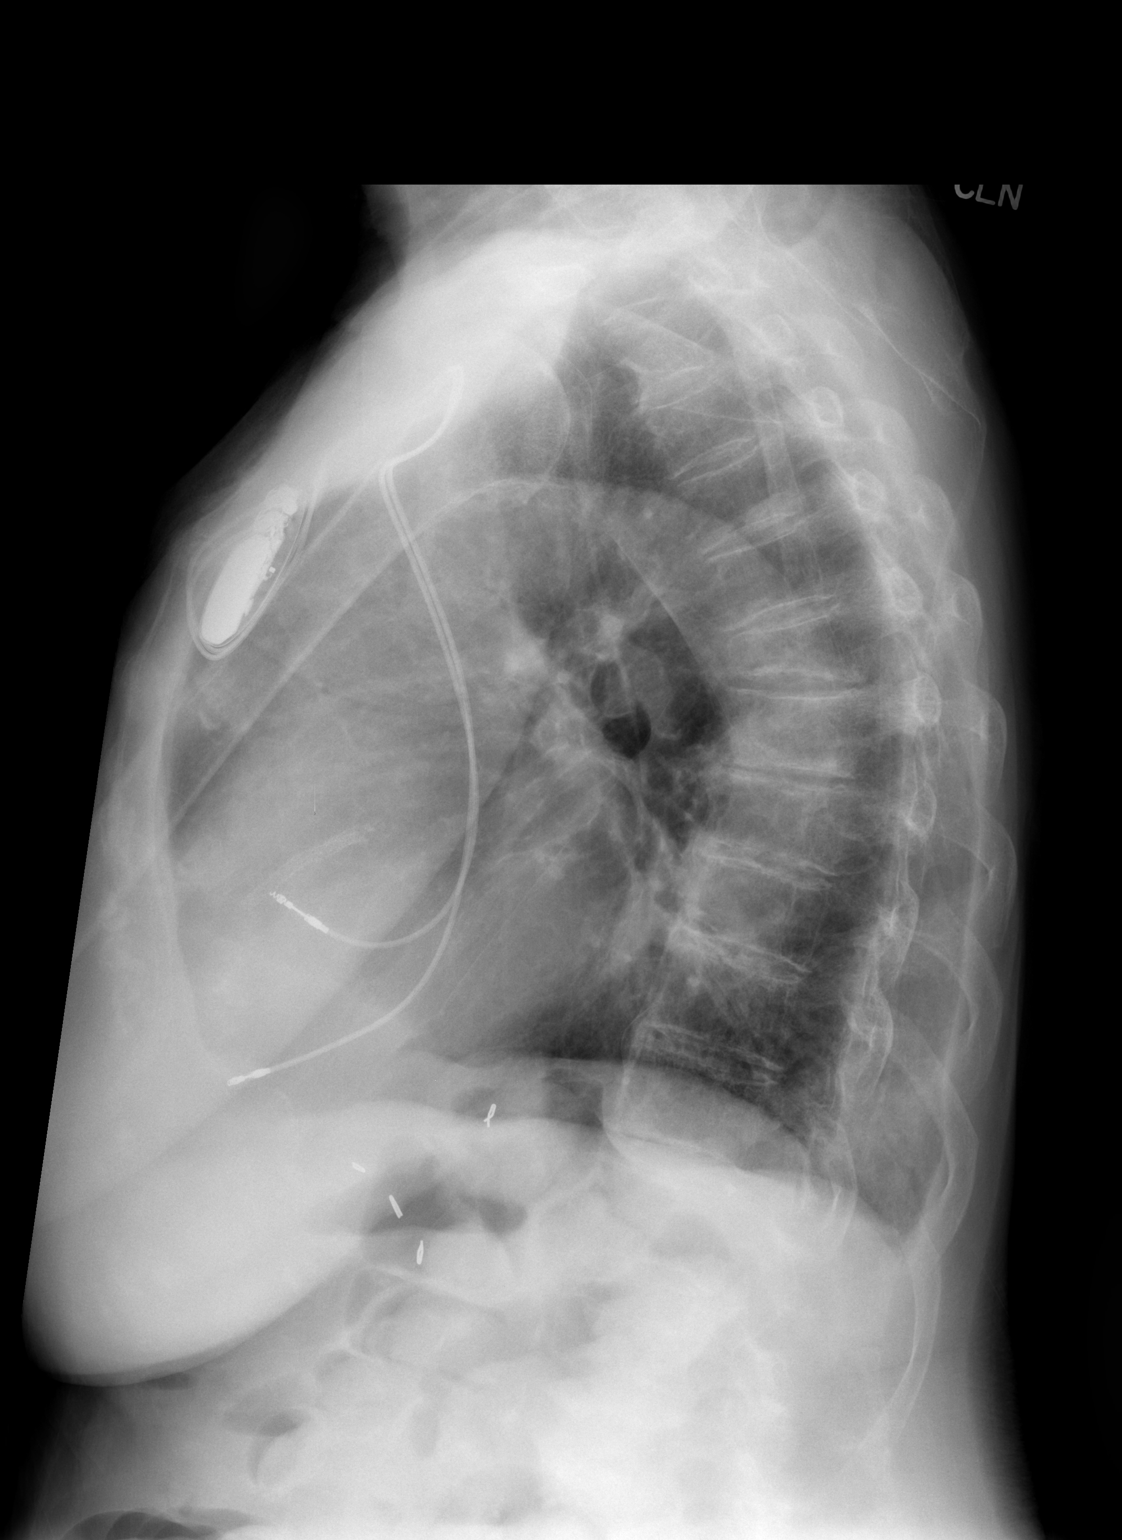

[2 of 2 positions shown; findings below may reference images not displayed]

FINDINGS: Dual lead pacer is in place from the left with leads in
the region of the right atrium and right ventricle.  No
pneumothorax.  No infiltrate or congestive heart failure.
Cardiomegaly.  Calcified tortuous aorta.  Remote to mid thoracic
compression fractures seen 2338 exam.
IMPRESSION: Placement of dual lead pacemaker with leads in the region of the
right atrium and right ventricle.  No pneumothorax.

## 2010-09-26 NOTE — H&P (Signed)
[name], [name]             ACCOUNT NO.:  [id number]   MEDICAL RECORD NO.:  [id number]          PATIENT TYPE:  INP   LOCATION:  2916                         FACILITY:  [facility]   PHYSICIAN:  [name], N.P.  DATE OF BIRTH:  Feb 11, 1916   DATE OF ADMISSION:  12/18/2006  DATE OF DISCHARGE:                              HISTORY & PHYSICAL   PROBLEM:  Chest pain.   HISTORY OF PRESENT ILLNESS:  [name] is a pleasant 77 year old  white female, patient of Dr. Verdis [name], with known coronary artery  disease.  She states she woke up this morning, was not feeling well in  general.  She developed some shortness of breath as she started her day.  She went ahead to cardiac rehab.  By the time she got there, she began  experiencing some midsternal chest pressure.  She was advised to report  to her cardiologist for urgent work-in.  When the patient got here, she  continued to have some chest pressure, 7 on a scale of 1-10.  It has not  irradiated.  She has had no arm weakness.  She also denies any sweats,  nausea or vomiting.  Nitroglycerin was given in the office and her  pressure lessened to a 4-5.  EKG revealed atrial fibrillation, which is  chronic for her, at a controlled ventricular response rate.  There were  no signs of ischemia.   The patient tells [place] that she has had some episodes of this over the  last few days.  It has not seemed to worsen with any activity.  Due to  her coronary artery disease history and current chest pressure, she is  being admitted for unstable angina.   REVIEW OF SYMPTOMS:  As above.  No fever or chills.  HEENT:  No problems  with vision, recent upper respiratory infection, tinnitus.  CHEST:  As  above.  The discomfort is not like what she had in the past.  At that  time, it was more of a sharp, severe discomfort.  There is no pleuritic  component of the chest pain.  RESPIRATORY:  As above.  She denies any  wheezing.  Shortness of breath is  worse with ambulating.  She has had no  cough.  Also denies any PND or orthopnea.  ABDOMEN:  No discomfort.  No  nausea or vomiting.  EXTREMITIES:  Has had some mild pedal edema.  No  leg or calf discomfort.  NEURO:  Denies any numbness or tingling.  Has  had some dizziness with position changes, but denies any presyncope or  syncope.  No focal weakness.   PAST MEDICAL HISTORY:  1. Atrial fibrillation, controlled ventricular rate, on Coumadin.  2. Recent chest pain that prompted a cardiac catheterization back in      June of this year.  Status bare metal stent to the left anterior      descending, ejection fraction of 50%.  3. Hypertension.  4. Dyslipidemia.  5. Chronic obstructive pulmonary disease.  6. Gastritis.  7. Gastroparesis.  8. Vitamin B12 deficiency.  9. Osteoporosis.  10.History of TIA.  PAST SURGICAL HISTORY:  1. History of varicose vein stripping.  2. History of PUD requiring emergency surgery.  3. Right hip fracture repair.   FAMILY HISTORY:  She has multiple siblings.  Father died at age 38 of  sudden death.  She has an 66 year old sister still living, who has  Alzheimer's.   SOCIAL HISTORY:  She resides at friend's home.  Retired Engineer, site.  She has a son in town who helps with her care.   MEDICATIONS:  1. Coumadin.  2. Fish oil 1200 mg b.i.d.  3. Calcium 600 mg.  4. Vitamin D b.i.d.  5. Vitamin B12 injections q.3 months.  6. Fosamax 70 mg with vitamin D weekly.  7. Isosorbide 30 mg daily.  8. Zocor 2 mg a day.  9. Metamucil daily.  10.Prilosec 20 mg a day.  11.Nitroglycerin sublingual 0.4 mg p.r.n.  12.Aspirin 81 mg a day.  13.Cardizem 120 mg a day.  14.Detrol LA daily.  15.Tylenol 650 q.4-6 hours p.r.n. pain.   DRUG ALLERGIES:  SULFA.   OBJECTIVE DATA:  VITAL SIGNS/GENERAL:  Weight 140.4, pulse 70, blood  pressure 108/58.  O2 saturations were 94% on room air.  She is not  tachypneic and, in fact, is in no acute distress.  Skin warm and  dry.  HEENT:  Ears and throat are unremarkable.  NECK:  No JVD.  I hear no carotid bruits.  There is no adenopathy.  The  thyroid is not palpable.  CHEST:  Slightly irregular rhythm, rate in the 70s.  I hear no murmur,  click or rub.  No gallop.  Breath sounds are clear throughout.  ABDOMEN:  Soft, nondistended.  Bowel sounds present in upper quadrant.  I hear no abdominal bruits.  No hepatosplenomegaly.  EXTREMITIES:  Left greater than right pedal edema.  Peripheral pulses  difficult to palpate.  NEURO:  Cranial nerves II-XII intact.  Gait is steady.  Speech is  appropriate.  Strength 5/5 and symmetrical.  DTRs are +2 and  symmetrical.   LABORATORY DATA:  An EKG reveals an atrial fibrillation, a controlled  ventricular rate in the 90s.  No signs of ischemia.   IMPRESSION:  1. Chest pain in a patient with known CAD and known distal RCA lesion      per cardiac cath in June of this year.  2. Recent LAD bare metal stent placement.  3. Atrial fibrillation, on chronic Coumadin therapy.   PLAN:  Admit, IV nitroglycerin to control pain.  Stop Coumadin, start  Lovenox therapy.  Continue on Cardizem.  Obtain cardiac isoenzymes.  Dr.  Katrinka [name] or associate to follow.      [name], N.P.     TCL/MEDQ  D:  12/18/2006  T:  12/19/2006  Job:  [id number]

## 2010-09-26 NOTE — Cardiovascular Report (Signed)
NAMEROSHANDA, BALAZS NO.:  [id number]   MEDICAL RECORD NO.:  [id number]          PATIENT TYPE:  INP   LOCATION:  2807                         FACILITY:  [facility]   PHYSICIAN:  Lyn Records, M.D.   DATE OF BIRTH:  November 13, 1915   DATE OF PROCEDURE:  DATE OF DISCHARGE:                            CARDIAC CATHETERIZATION   INDICATION FOR THE PROCEDURE:  Class IV angina/unstable angina.   PROCEDURE PERFORMED:  1. Left heart catheterization.  2. Selective coronary angiography.  3. Left ventriculography.  4. PCI with bare-metal stents, LAD.  5. Angio-Seal.   DESCRIPTION:  The patient was brought to the cardiac catheterization  laboratory and placed in the usual supine position where she was  sterilely prepped and draped.  Xylocaine 2% was used for local  anesthesia.  A 6-French sheath was placed after local Xylocaine  infiltration.  We performed selective coronary angiography using 6-  [place] preformed Judkins catheters.  We also performed left  ventriculography using a 6-French A2 multipurpose catheter.  This  catheter was also used for hemodynamic recordings.   After reviewing the cines we felt that PCI on the LAD was the  appropriate treatment.  We then performed PCI on the LAD.  We had  difficulty finding a guide catheter that would achieve aggressive  support.  The line of CLS and XB catheters would not intubate the  vessel.  Her root diameter was large but there was severe upward  angulation.  We used a left Judkins 4 but this would not provide  adequate support to cross with the wire.  We then upgraded to a Judkins  4.6 6-French that allowed adequate intubation but very tenuous support.  This required that we use a buddy wire during stent placement because of  poor support.   We predilated with a 20-mm long 2.5-mm Maverick balloon.  We needed to  predilate with a 3.0 20-mm balloon because my initial attempts to pass  an 18 mm Liberte stent resulted in the  guide catheter backing out.  We  then used the buddy wire, a BMW, along with the treating rail which was  a Forensic psychologist.  We were able to deploy an 18 mm long Liberte distally and  we dilated to 11 atmospheres.  We then, after removing the buddy wire,  had to replace the buddy wire back down the vessel and tract a 20 mm  long Multilink Vision.  We overlapped this with the first stent and we  felt that this was covering the entire remaining treatment region.  We  deployed this stent to 10 atmospheres.  With stent shortening occurring,  there was an uncovered proximal segment near the tightest portion of the  native lesion.  Sluggish flow was noted after the stent was deployed.  We deployed a third short 8 mm long Vision stent overlapping with the  second stent proximally.  This resulted in a very nice angiographic  result and resolution of sluggish flow.  Adenocar 26 mcg was given after  the third stent was placed.  Post dilatation with a 300 x 20 Quantum  balloon was  performed to 14 atmospheres and 15 atmospheres up and down  the stented region.  The case was then terminated and Angio-Seal was  performed.   The patient received 600 mg of Plavix on the table, and a bolus followed  by an infusion of Angiomax.   RESULTS:  1. Hemodynamic data:      a.     Aortic pressure 136/78.      b.     Left ventricular pressure 138/22.  2. Left ventriculography:  The left ventricle is normal in size.      There is sluggish contractility with EF estimated to be      approximately 50%.  3. Coronary angiography.      a.     Left main coronary:  Calcified ostium, 40%, no damping with       catheter engagement.      b.     Left anterior descending coronary:  Large vessel, moderate       proximal and mid calcification, large diagonal arises from it.       The diagonal branch contains 70% proximal stenosis.  LAD contains       proximal 99% stenosis followed by segmental 70-80% stenosis over a       30- to  35-mm segment in the proximal to mid LAD.  TIMI grade 2       flow was noted.      c.     Circumflex artery:  Contains three obtuse marginals, 50% mid       stenosis.      d.     Right coronary:  Multiple 30-50% stenoses proximal, mid and       distal; 90% stenosis before the PDA in the distal RCA.  This os a       relatively focal but eccentric stenosis.  4. PCI:  A 99% segmental stenosis noted in the proximal LAD.  Three      overlapping bare-metal stents were placed, starting in the mid      vessel and overlapping back proximally to cover the entire region      of stenosis.  TIMI grade 3 flow was noted post procedure with      complete resolution of symptoms.  High-pressure post dilatation      with a 3.0 Quantum to 14 atmospheres was carried out.  5. Angio-Seal arteriotomy closure was successful.   CONCLUSION:  1. Class IV angina secondary to severe proximal and mid left anterior      descending artery disease.  The patient also has concomitant severe      distal right coronary artery disease  2. Successful percutaneous coronary intervention on the LAD, reducing      the stenosis from 99% to 0% and improving flow from TIMI 2 to TIMI      3.  3. Mild decreased left ventricular function.   PLAN:  1. Aspirin and Plavix.  Plavix should be continued 2-3 months if      possible.  2. Possibly home in a.m.  3. If continuing to have angina, we will consider PCI on the RCA at a      later date.      Lyn Records, M.D.  Electronically Signed     HWS/MEDQ  D:  10/28/2006  T:  10/28/2006  Job:  [id number]   cc:   Soyla Murphy. Renne Crigler, M.D.

## 2010-09-26 NOTE — Discharge Summary (Signed)
[name], [name]             ACCOUNT NO.:  [id number]   MEDICAL RECORD NO.:  [id number]          PATIENT TYPE:  INP   LOCATION:  1511                         [facility]ACILITY[facility]   PHYSICIAN:  Peggye Pitt, M.D. DATE OF BIRTH:  1916/03/25   DATE OF ADMISSION:  02/12/2008  DATE OF DISCHARGE:  02/16/2008                               DISCHARGE SUMMARY   DISCHARGE DIAGNOSES:  1. Acute bronchitis, resolved.  2. Persistent atrial fibrillation, on Coumadin.  3. Hypertension.  4. Hyperlipidemia.  5. Vitamin B12 deficiency.  6. Coronary artery disease.  7. History of peptic ulcer disease.  8. Gastritis.   DISCHARGE MEDICATIONS:  1. Prednisone taper starting at 60 mg daily and decreasing by 10 mg      daily until off.  2. Albuterol metered dose inhaler 2 puffs as needed for shortness of      breath.  3. Vitamin B12 1000 mg intramuscular injection every three months.  4. Zocor 40 mg p.o. nightly.  5. Plavix 75 mg p.o. daily.  6. Coumadin 5 mg on Monday, Wednesday, and Friday, and 2.5 mg on      Tuesday, Thursday, Saturday, and Sunday.  7. Nitroglycerin 0.4 mg sublingual p.r.n. chest pain.  8. Tylenol 650 mg t.i.d. p.r.n.  9. Advair 100/50 mcg 1 puff b.i.d.   DISPOSITION/FOLLOWUP:  Patient is discharged back to [facility] in stable condition.  She is not requiring  oxygen and is no longer short of breath and has completed a course of  antibiotics.   CONSULTATIONS THIS ADMISSION:  None.   IMAGING PERFORMED THIS ADMISSION:  1. Chest x-ray on February 12, 2008, consistent with central airway      thickening, compatible with bronchitis.  No evidence of pneumonia.      Small bilateral pleural effusions and stable megaly with aortic      ectasia.  2. Repeat chest x-ray on February 15, 2008 that showed decreasing course      and central bronchovascular markings, consistent with bronchitis,      slight increase in bibasilar atelectasis and small bilateral     pleural effusions again.   HISTORY AND PHYSICAL EXAM:  For full details, please refer to history  and physical exam dictated by myself on February 12, 2008, but in brief,  [name] is a pleasant 77 year old Caucasian woman who came into  the emergency department from her assisted living facility secondary to  cough with yellow sputum production x6 days as well as increased  shortness of breath with fevers up to 101, chills, and malaise.  She had  been started on Avelox on a prednisone taper by her PCP with no  improvement, which is why she was transferred to the hospital for  further evaluation, and we were called.   HOSPITAL COURSE:  1. Acute bronchitis, as evidenced by her clinical findings and her      chest x-ray.  She was continued on the Avelox started by her PCP.      We gave her oxygen, IV fluids, hydration, albuterol and Atrovent      nebs.  We continued her on her Advair, and we placed on IV      steroids, which we have slowly tapered.  She will  go home on a      slow prednisone taper.  She has completed her course of      antibiotics.  She is doing much better and is ready for discharge.  2. All of the rest of her medical issues were stable throughout her      hospitalization.   VITAL SIGNS ON DAY OF DISCHARGE:  Blood pressure 129/68, heart rate 84,  respirations 18, O2 sats 98% on room air.  Temp 97.6.   LABS ON DAY OF DISCHARGE:  Sodium 142, potassium 4.4, chloride 106,  bicarb 32, BUN 11, creatinine 0.70, glucose 106, WBCs 6.4, hemoglobin  11, platelets 242.  She had an INR of 1.8, slightly subtherapeutic.      Peggye Pitt, M.D.  Electronically Signed     EH/MEDQ  D:  02/16/2008  T:  02/16/2008  Job:  [id number]   cc:   Soyla Murphy. Renne Crigler, M.D.  Fax: [phone]

## 2010-09-26 NOTE — Cardiovascular Report (Signed)
NAMEDENENE, ALAMILLO NO.:  [id number]   MEDICAL RECORD NO.:  [id number]          PATIENT TYPE:  INP   LOCATION:  2916                         FACILITY:  [facility]   PHYSICIAN:  Lyn Records, M.D.   DATE OF BIRTH:  November 01, 1915   DATE OF PROCEDURE:  12/23/2006  DATE OF DISCHARGE:                            CARDIAC CATHETERIZATION   INDICATIONS FOR PROCEDURE:  Stable angina pectoris.   PROCEDURE PERFORMED:  1. Left heart catheterization.  2. Selective coronary angiogram.  3. Left ventriculography.  4. Bare-metal stent, distal right coronary, and bare-metal stent,      proximal diagonal #1.   DESCRIPTION:  After informed consent and following 1 mg of IV Versed,  the right femoral was anesthetized with 1% Xylocaine local infiltration.  We then placed a 6-French sheath in the right femoral artery using a  modified Seldinger technique.  A 6-French A2 multipurpose catheter was  then used for hemodynamic recordings, left ventriculography by hand  injection, and selective right coronary angiography.  We used a #46-  [place] left Judkins catheter for left coronary angiography.   The patient has been receiving clopidogrel since admission to the  hospital, December 19, 2006.  She was given Angiomax intravenously as a  bolus followed by an infusion.  ACT was documented to be greater than  300.  We used a Cordis 6-French #4 right Judkins guide catheter and an  Office manager.  We predilated the distal right coronary with a 2.5  x 12-mm Maverick and deployed a 15-mm-long x 3-mm Vision stent, which  was postdilated with a Quantum Maverick, 12 mm length, 3.2 mm diameter,  to 14 atmospheres.  After documenting patency and being please with the  result, we turned our attention to the left coronary system.  The reason  for pursuing the diagonal was because during inflation in the right  coronary, the symptom produced was not that that has caused admission to  the hospital.  We  then used a 4.5 Cordis 6-French left system guide  catheter and the same [employer] guidewire.  We easily crossed into  the diagonal through the eccentric proximal lesion and we predilated  with a 2 x 12-mm-long Maverick balloon.  Balloon occlusion of the  diagonal reproduced the symptoms that have caused admission to the  hospital.  We then deployed a 2.25 x 15-mm Mini Vision to 13  atmospheres.  We then postdilated with a 2.5 x 12-mm Quantum to 14  atmospheres.  The patient tolerated the procedure without complications.  Angio-Seal was used for hemostasis with good result.   RESULTS:   I. HEMODYNAMIC DATA:  A.  Aortic pressure 147/80.  B.  Left ventricular pressure 147/20.   II. LEFT VENTRICULOGRAPHY:  There is anterior and inferior hypokinesis.  EF is 40%.  When compared to the prior study, the anterior wall motion  is improved.  Inferior wall motion appears to be worse.  EF is  approximately 40%.  No mitral regurgitation.   III. CORONARY ANGIOGRAPHY:  A.  Left main coronary:  Widely patent.  B.  Left anterior descending coronary:  The LAD is a large vessel that  wraps around the left ventricular apex.  It gives origin to the first  diagonal and to a large continuation of the LAD, which has been stented.  The stents in the LAD are widely patent. The proximal diagonal lesion is  very eccentric and in some views is greater than 90% obstructed; other  views, it appears to be less than or equal to 50%.  There is diffuse  disease proximal and distal to the first diagonal origin in the LAD, but  this disease is not felt to be significant or changed from the post-  interventional images at the time of prior cath.  LAD is diffusely  diseased near the apex..  C.  Circumflex artery:  The circumflex coronary artery is large and  gives origin to 4 obtuse marginals and there is 50% obstruction after  the second obtuse marginal.  Luminal irregularities are noted throughout  the proximal  and mid vessel.  No high-grade obstruction is seen.  D.  Right coronary:  The right coronary artery contains multiple luminal  irregularities throughout the mid vessel, obstructing the vessel up to  50%.  Distally, there is a shelf-like high-grade obstruction, eccentric  in nature, that obstructs the vessel by greater than 90%.  The distal  bed is very large.   IV. PERCUTANEOUS CORONARY INTERVENTION:  A.  Distal right coronary:  Greater than 90% stenosis is reduced to 0% with TIMI grade 3 flow after  bare-metal stenting with a 15-mm-long Vision stent postdilated to 3.25  mm diameter.  B.  The proximal eccentric diagonal stenosis that in some views appears  be greater than 90% was treated and stented with a 2.25 Mini Vision 15-  mm-long stent, reducing the stenosis to 0%.  Balloon inflation in this  territory reproduced the patient's symptoms that she has been having at  rest and that led to this hospitalization.   CONCLUSIONS:  1. Successful bare-metal stent implantation in the distal right      coronary with reduction of greater than 90% stenosis to 0%.  2. Successful bare-metal stent in the proximal diagonal #1, which is      the culprit lesion for the patient's presentation, with reduction      in the stenosis from greater than 90% to 0% with TIMI grade 3 flow.  3. Widely patent left anterior descending stents placed at the time of      prior intervention in June.  There is moderate disease beyond the      first diagonal origin in the left anterior descending before the      stented region is encountered.  Flow is brisk, however.  The      circumflex is widely patent.  4. The left ventricular function is mildly to moderately reduced with      both anterior and inferior wall hypokinesis.  Ejection fraction is      40%.   PLAN:  Aspirin and Plavix dual therapy for at least 4 weeks.  Resume  Coumadin long term.  Hopeful discharge December 24, 2006.      Lyn Records, M.D.   Electronically Signed     HWS/MEDQ  D:  12/23/2006  T:  12/24/2006  Job:  [id number]   cc:   Soyla Murphy. Renne Crigler, M.D.

## 2010-09-26 NOTE — Consult Note (Signed)
NAMEDOCIE, ABRAMOVICH             ACCOUNT NO.:  [id number]   MEDICAL RECORD NO.:  [id number]          PATIENT TYPE:  INP   LOCATION:  2926                         FACILITY:  [facility]   PHYSICIAN:  Casimiro Needle L. Reynolds, M.D.DATE OF BIRTH:  July 30, 1915   DATE OF CONSULTATION:  10/29/2006  DATE OF DISCHARGE:                                 CONSULTATION   REQUESTING PHYSICIAN:  Lyn Records, M.D.   REASON FOR EVALUATION:  Transient aphasia.   HISTORY OF PRESENT ILLNESS:  This is the inpatient consultation  evaluation of this existing [facility] patient, an  77 year old woman who was admitted to the stroke service in September of  last year after presenting with a right brain transient ischemic attack.  Her medical history is also remarkable for atrial fibrillation for which  she is chronically anticoagulated, hypertension, dyslipidemia, B12  deficiency and several other medical problems.  She resides at [facility] and is pretty independent her activities of daily living.  The  patient presented to the emergency department with midsternal chest pain  and was admitted for unstable angina.  Her INR was allowed to drift  downward, and she underwent cardiac catheterization with a PTCA of the  LAD with stent placement.  She has had a few brief pauses on the monitor  but has not been symptomatic with this.  She was doing well today and  actually her discharge was being tentatively planned when she had an  episode of aphasia.  She states that the nurse came in to talk to her  and she was unable to get any words out.  She did not notice any  associated focal weakness associated with this.  It lasted no more than  5-10 minutes and then resolved, and now she feels that she is speaking  entirely normally.  A little bit later she got up with cardiac rehab and  walked around the CCU.  She states that she felt she was leaning a  little bit to the right with that, although she also  notes that she has  not been out of bed in a couple of days and that she normally has some  problems with her right hip.  Nevertheless, she does not feel her gait  was entirely normal.  Other than that, she feels completely normal.  She  was able to use her fork and spoon normally to eat lunch this afternoon.  She has had a CT scan, and neurologic consultation has been requested.   PAST MEDICAL HISTORY:  As above.  Her stroke workup been in September  2007, did include carotid Dopplers which were unremarkable, MRA which  demonstrated basically normal intracranial vessels, and a 2-D  echocardiogram which showed an EF of 55% with no regional wall motion  abnormalities.  Her other medical problems include a history of  gastritis, B12 deficiency, COPD, difficulties with gastrointestinal  motility, osteoporosis, history of PUD with bowel perforation and  subsequent Billroth procedure.   FAMILY/SOCIAL/REVIEW OF SYSTEMS:  As outlined in admission H&P of October 24, 2006, by Dr. [place], which is reviewed.  MEDICATIONS:  At admission her medications included Coumadin, Enablex,  Zocor, Protonix, Cardizem, calcium, Reglan, Imdur, Advair, Zocor.   In the hospital she is receiving aspirin, Colace, Enablex, Cardizem,  Protonix, calcium, Reglan 5 mg b.i.d., Zocor, Advair, Lopressor, Plavix,  intravenous heparin.   PHYSICAL EXAMINATION:  VITAL SIGNS:  Temperature 97.9, blood pressure  118/83, pulse 89 and irregular, respirations 21.  GENERAL EXAMINATION:  This is an elderly but healthy-appearing woman  supine in the hospital bed, in no evident distress.  HEENT:  Cranium is normocephalic and atraumatic.  Oropharynx benign.  NECK:  Supple without carotid bruits.  HEART:  Irregularly irregular rhythm without murmurs.  NEUROLOGIC:  Mental status:  She is awake and alert.  She is fully  oriented to time and place.  Recent and memory are adequate for history-  giving.  Attention span, concentration and  fund of knowledge are not  noticeably impaired.  Speech is fluent and not dysarthric.  She is able  to name objects, repeat phrases and follow complex commands.  Cranial  nerves:  Pupils are equal and reactive.  Extraocular movements full  without nystagmus.  Visual fields full to confrontation.  Hearing is  intact to conversational speech.  Face, tongue and palate move normally  and symmetrically.  Motor:  Normal bulk and tone.  Normal strength in  all tested extremity muscles.  Sensation:  Intact to light touch in all  extremities.  Coordination:  Rapid movements are performed accurately.  Finger-to-nose is performed accurately.  Gait:  She arises slowly from  the chair.  Her stance is normal.  When walking, she does lean to the  right just a little bit but she does not feel unsteady and can ambulate  without assist.  Reflexes 1+ and symmetric.  Toes are downgoing  bilaterally.   LABORATORY REVIEW:  CBC from this morning:  White count 6.1, hemoglobin  11.5, platelets 157,000.  BMET from this morning remarkable for a  slightly elevated glucose of 103.  CT of the head performed this  afternoon is personally reviewed.  This study is remarkable for moderate  white matter disease and mild atrophy.  No evident acute finding.   IMPRESSION:  Transient aphasia.  This probably is due to a left brain  cardioembolic transient ischemic attack in a patient with atrial  fibrillation, a subtherapeutic INR, and a recent stroke workup which was  otherwise negative.   RECOMMENDATIONS:  Would maintain on intravenous heparin until her INR is  therapeutic.  MRI of the brain would demonstrate whether or not she had  actually e had a stroke or not, but this would not alter her management  and therefore I do not think it is particularly necessary.  Likewise, I  do not think any other stroke workup would be of any particular value.  Hopefully, she will do well from this point.   Thank you for the  consultation.      Michael L. Thad Ranger, M.D.  Electronically Signed     MLR/MEDQ  D:  10/29/2006  T:  10/30/2006  Job:  [id number]   cc:   Lyn Records, M.D.  Soyla Murphy. Renne Crigler, M.D.

## 2010-09-26 NOTE — H&P (Signed)
NAMECOLLETTE, PESCADOR             ACCOUNT NO.:  [id number]   MEDICAL RECORD NO.:  [id number]          PATIENT TYPE:  INP   LOCATION:  1511                         FACILITY:  [facility]   PHYSICIAN:  Peggye Pitt, M.D. DATE OF BIRTH:  Aug 19, 1915   DATE OF ADMISSION:  02/12/2008  DATE OF DISCHARGE:                              HISTORY & PHYSICAL   PRIMARY CARE PHYSICIAN:  Soyla Murphy. Renne Crigler, MD   CHIEF COMPLAINT:  Shortness of breath and cough.   HISTORY OF PRESENT ILLNESS:  Mrs. Gholson is a delightful 77 year old  Caucasian woman who comes in to the [facility]  today from her nursing facility which is [facility] secondary to  cough with yellow sputum production x6 days, as well as increased  shortness of breath with fever up to 101, chills, and malaise.  She  visited her PCP on Monday who gave her prednisone taper and Avelox, but  because of increasing respiratory distress, she is here today and we are  called to admit her.   ALLERGIES:  She has stated allergies to AMPICILLIN, DEMEROL, and SULFA  DRUGS.   PAST MEDICAL HISTORY:  1. Significant for atrial fibrillation on chronic Coumadin.  2. Hypertension.  3. Hyperlipidemia.  4. Gastritis.  5. History of peptic ulcer disease.  6. Vitamin B12 deficiency, on replacement.  7. History of a TIA.  8. Coronary artery disease status post a drug eluding stent to the      left anterior descending coronary artery in February 2009.   SOCIAL HISTORY:  She is a former smoker, quit over 15 years ago.  She  denies any alcohol or illicit drug use.  Lives at a nursing facility,  but has children who live close by and are very supportive of her care.  She is widowed.   FAMILY HISTORY:  Noncontributory in this elderly female.   REVIEW OF SYSTEMS:  Negative except as per HPI.   PHYSICAL EXAMINATION:  VITAL SIGNS: Upon admission, temperature 97.8,  heart rate 105, respirations 24, blood pressure is 140/82 with an  O2  saturation of 98% on 2 liters.  GENERAL:  She is alert, awake, oriented x3 in no acute distress.  HEENT:  Normocephalic, atraumatic.  Her pupils are equal and reactive to  light and accommodation with intact extraocular movements.  NECK:  Supple with no JVD, no lymphadenopathy, no bruits or goiter.  LUNGS:  She has diffuse expiratory wheezes bilaterally with some  rhonchi, but no crackles are auscultated.  CARDIOVASCULAR:  Regular rate and rhythm with no murmurs, rubs or  gallops.  ABDOMEN:  Soft, nontender, nondistended with positive bowel sounds.  EXTREMITIES:  No edema, positive pulses.  NEUROLOGIC:  Grossly intact and nonfocal.   LABS UPON ADMISSION:  Sodium 145, potassium 3.8, chloride 109, bicarb  28, BUN 16, creatinine 0.76, glucose of 113.  All of her liver function  tests are within normal limits.   WBC is 7.1, hemoglobin 10.9, platelets of 203.  She has a therapeutic  INR at 2.6.  PTT at 39 and a negative urinalysis.   She had a chest  x-ray that showed central airway thickening consistent  with acute bronchitis.  No evidence for pneumonia with small bilateral  pleural effusions and stable cardiomegaly.   ASSESSMENT AND PLAN:  1. Acute bronchitis:  With acute wheezing.  We will start on nebulizer      treatments consistent of albuterol and Atrovent q.6 h, as well as      q.2 h as needed p.r.n.  We will start IV Solu-Medrol at 60 mg IV      q.8 h and taper slowly.  We will continue her Advair and we will      also continue her Avelox which was started on February 09, 2008,      and stated to continue until February 15, 2008.  We will also      continue her droplet precautions.  2. For her atrial fibrillation, we will continue on her Coumadin to be      monitored by pharmacy.  3. For her hypertension, she does not appear to be on any medications      for blood pressure, so we will monitor for now:  4. History of hyperlipidemia.  We will check a fasting lipid panel in       the morning and continue her Zocor.  5. For her gastritis and history of peptic ulcer disease, we will      start her on a PPI while in the hospital.  6. For her coronary artery disease status post stenting in February      2009.  We will continue her Plavix and heart medications.  She is      not complaining of any chest pain at this point.  7. For prophylaxis while in the hospital, was placed on Protonix for      GI prophylaxis and on subcutaneous Lovenox for deep venous      thrombosis prophylaxis.      Peggye Pitt, M.D.  Electronically Signed     EH/MEDQ  D:  02/12/2008  T:  02/13/2008  Job:  [id number]   cc:   Soyla Murphy. Renne Crigler, M.D.  Fax: [phone]

## 2010-09-26 NOTE — Discharge Summary (Signed)
[name], [name] NO.:  [id number]   MEDICAL RECORD NO.:  [id number]          PATIENT TYPE:  INP   LOCATION:  6533                         FACILITY:  [facility]   PHYSICIAN:  Lyn Records, M.D.   DATE OF BIRTH:  06-Oct-1915   DATE OF ADMISSION:  07/04/2007  DATE OF DISCHARGE:  07/08/2007                               DISCHARGE SUMMARY   DISCHARGE DIAGNOSES:  1. Coronary artery disease; status post percutaneous coronary      intervention to the left anterior descending artery, utilizing a      drug eluting stent.  2. Chronic atrial fibrillation.  3. Chronic Coumadin anticoagulation.  4. History of hypertensive cardiovascular disease.  5. Hyperlipidemia.  6. Perforated peptic ulcer, because of long-term medication use.   [name] comes in on July 04, 2007 with unstable angina.  She  ruled out for myocardial infarction with cardiac isoenzymes.   Because of her known coronary artery disease and chest pain complaints,  we did perform a left heart catheterization, that showed 95% diffuse in-  stent restenosis of the LAD.  We placed 2 Cypher stents to that area,  resulting in 0% stenosis status post the procedure.  The patient had  AngioSeal with difficulty.   DISCHARGE LABS:  Hemoglobin 11.7, hematocrit 34.7, white count 50.9,  platelets 177.  Potassium 4.2, BUN 13, creatinine 0.78, CO2 16.7, INR  1.3.   The patient is discharged to home in stable condition.  A portable chest  x-ray on admission showed stable cardiomegaly and no active lung  disease.  EKG showed atrial fibrillation with controlled ventricular  response at 72 beats per minute; nonspecific ST-T wave changes  diffusely.   DISCHARGE MEDICATIONS:  1. Fosamax weekly.  2. Enteric-coated aspirin 325 mg daily.  3. Coumadin 2.5 mg daily.  4. Zocor 40 mg daily.  5. Caltrate plus Vitamin D daily.  6. Prilosec 20 mg twice a day.  7. Advair diskus one puff twice a day.  8. Detrol LA 4 mg a  day.  9. Imdur 120 mg each evening.  10.Metoprolol ER 25 mg daily.  11.Sublingual nitroglycerin p.r.n. chest pain.  12.Plavix 75 mg daily for a minimum of one year.   Cleanse the catheterization site gently with soap and water; no  scrubbing.  Remain on a low-fat heart healthy diet.  Increase activity  slowly.  __________  weightbearing, no driving for 2 days.   We want the patient to have a Coumadin check on Friday, July 11, 2007, to ensure her INR is significantly increased.  The INR goal is to  be from 1.8 to 2.2.      [name], P.A.      Lyn Records, M.D.  Electronically Signed    LB/MEDQ  D:  07/08/2007  T:  07/08/2007  Job:  [id number]

## 2010-09-26 NOTE — Discharge Summary (Signed)
NAMELILIANA, DANG NO.:  [id number]   MEDICAL RECORD NO.:  [id number]          PATIENT TYPE:  INP   LOCATION:  2003                         FACILITY:  [facility]   PHYSICIAN:  [name], P.A.       DATE OF BIRTH:  1916/02/02   DATE OF ADMISSION:  10/24/2006  DATE OF DISCHARGE:  10/31/2006                               DISCHARGE SUMMARY   DISCHARGE DIAGNOSES:  1. Atrial fibrillation.  2. Transient ischemic attack.  3. Anticoagulation therapy with Coumadin.  4. Coronary artery disease.  Status post bare metal stent to the LAD.  5. Ejection fraction of 50%.  6. Hypertension.  7. Dyslipidemia.  8. Chronic atrial fibrillation.   I'm sorry, cancel this dictation.  I have already dictated this as  number [id number].      [name], P.A.     LB/MEDQ  D:  11/20/2006  T:  11/21/2006  Job:  [id number]

## 2010-09-26 NOTE — Discharge Summary (Signed)
[name], MARIK NO.:  [id number]   MEDICAL RECORD NO.:  [id number]          PATIENT TYPE:  INP   LOCATION:  3733                         FACILITY:  [facility]   PHYSICIAN:  Lyn Records, M.D.   DATE OF BIRTH:  10-09-1915   DATE OF ADMISSION:  06/15/2007  DATE OF DISCHARGE:  06/16/2007                               DISCHARGE SUMMARY   DISCHARGE DIAGNOSES:  1. Chest pain, resolved.  Dissimilar to previous cardiac pain.  2. Chronic atrial fibrillation.  3. Coronary artery disease, history of bare metal stent to the right      coronary artery and first diagonal in August 2008, with a history      of left anterior descending artery stents in June 2008.  4. Chronic obstructive pulmonary disease.  5. Hypertension.  6. Hyperlipidemia.  7. Allergies to Demerol, penicillin, and sulfa.  8. Long term medication use.   HOSPITAL COURSE:  [name] is an 77 year old female with a known  history of coronary artery disease as described above.  On the day of  admission, while in Sunday school, she had some anterior chest  discomfort that was different than her previous angina when she had her  stents placed.  The pain continued.  She did not try nitroglycerin to  see if it would help.  She did present to the emergency room and was  therefore admitted.  Laboratory studies showed normal cardiac isoenzymes.  Her EKG showed  atrial fibrillation with a rate of 63 with nonspecific ST-T wave changes  diffusely, not acute.  She was watched overnight and was felt to be safe for discharge to home  once her cardiac enzymes were negative.  We will discharge her to home with close clinical followup.   DISCHARGE MEDICATIONS:  1. Fosamax once a week.  2. Baby aspirin 81 mg a day.  3. Zocor 20 mg a day.  4. Coumadin as prior to admission which apparently is as follows:  5      mg on Monday, Wednesday, Friday, Saturday, Sunday and 2.5 mg on      Tuesday and Thursday.  5. B12  injections every 3 months.  6. Caltrate and vitamin D daily.  7. Prilosec 20 mg twice a day.  8. Fish oil daily.  9. Advair 100/50, one inhalation twice a day.  10.Detrol LA 4 mg daily.  11.Vicodin as needed.  12.Sublingual nitroglycerin p.r.n. pain.  13.We are adding Imdur 60 mg one p.o. daily.   She is to remain on a low sodium heart healthy diet.   Increase activity slowly.   Follow up with Dr. Katrinka Blazing on July 01, 2007 at 1:30 p.m.   She will be discharged back to her assisted living facility.      [name], P.A.      Lyn Records, M.D.  Electronically Signed    LB/MEDQ  D:  06/16/2007  T:  06/16/2007  Job:  [id number]   cc:   Soyla Murphy. Renne Crigler, M.D.

## 2010-09-26 NOTE — Cardiovascular Report (Signed)
[name], [name] NO.:  [id number]   MEDICAL RECORD NO.:  [id number]          PATIENT TYPE:  INP   LOCATION:  6533                         FACILITY:  [facility]   PHYSICIAN:  Lyn Records, M.D.   DATE OF BIRTH:  01-Jun-1915   DATE OF PROCEDURE:  07/07/2007  DATE OF DISCHARGE:                            CARDIAC CATHETERIZATION   INDICATIONS FOR PROCEDURE:  Unstable angina, prior bare-metal stent to  the LAD in June 2008; bare-metal stents to the [facility] and diagonal #1 in  August 2008.  Studies being done to reassess  patency of stents given  clinical symptoms of angina.   PROCEDURE PERFORMED:  1. Left heart catheterization.  2. Selective coronary angiography.  3. Left ventriculography.  4. Drug-eluting stent implantation proximal mid LAD for in-stent      restenosis of previously placed bare-metal Liberte stents.   DESCRIPTION OF PROCEDURE:  The patient was brought to the cath lab in a  postabsorptive state, 1 mg of Versed and 25 mcg fentanyl was used for  sedation, 1% Xylocaine was used for local anesthesia.  We then placed a  6-French sheath in the right femoral artery using a modified Seldinger  technique.  A 6-French A2 multipurpose Medtronic catheter was used for  cardiac catheterization, hemodynamic recordings, and right coronary  angiography.  We used a 6-French #4 left Judkins catheter for left  coronary angiography.  We reviewed the angiograms on digital cineloop  and identified the diffuse in-stent restenosis as the cause of the  patient's recurrent symptoms.  In-stent restenosis occurred in the LAD  where three previously placed overlapping bare-metal Liberte stents had  been placed in June of 2008.   We proceeded with PCI using a 4.5 x 6-French left Judkins guide  catheter.  We gave a bolus followed by an infusion of Angiomax.  Angiomax bolus was 0.75 mg/kg over 3 minutes.  We then began an infusion  of 1.75 mg/kg/minute.  ACT was documented to be  greater than 300.  The  patient had been loaded with Plavix on the evening prior to the  procedure, 75 mg was given this morning.   A BMW wire and a Prowater wire were then used.  The BMW was placed in  the large first diagonal that had been previously stented.  The P[place]ater  wire was placed in the distal LAD which contains the previously placed  bare-metal stents.  We used a 20-mm long [place] Quantum for pre-dilatation  within the previously stented region.  We then deployed a 33-mm long x  3.0 Cypher stent just beyond the distal margin of the previously placed  stent.  We then placed a second short Cypher 8-mm long x 3.0-mm diameter  stent proximally slightly proximal to the previously placed stent and  partially covering the ostium of large first diagonal.  Both of these  stents were deployed at 16 atmospheres.  We then postdilated in the  distal vessel with a 3.25 x 20-mm long [place] Quantum.  Peak pressure was 16  atmospheres.  This balloon was used to postdilate the distal and the mid  portion  of the stented region.  We then used a 3.5 x 15 [place] Sprinter in  the proximal third of the stent to 15 atmospheres.  Postprocedure, there  was some mild pinching of the ostium of the first diagonal, flow was  TIMI grade 3.  A 0% stenosis was noted postdilatation.  Angio-Seal was  performed with good hemostasis.  Angiomax will be allowed to run until  the current bag has completed and then the sheath will be removed  approximately 2 hours later.   RESULTS:  1. Hemodynamic data:  (a)  Aortic pressure 149/69.  (b)  Left ventricular pressure 151/7.  1. Left ventriculorrhaphy:  The anterior wall was mildly hypokinetic.      EF was 50%.  2. Coronary angiography:  (a)  Left main coronary:  Widely patent.  (b)  Left anterior descending coronary:  LAD is a large vessel that  wraps around the left ventricular apex, gives origin to a large first  diagonal.  The first diagonal contains a stent in the  proximal body  previously placed in August 2008.  The LAD after this first diagonal is  severely stenotic due to in-stent restenosis throughout the stented  region that would classify as diffuse in-stent restenosis; however,  there are 2 particular regions in the proximal and in the distal vessel  that are greater than 90% stenosed.  TIMI grade 2 flow is noted beyond  the stented region.  (c) Circumflex artery:  Circumflex is tortuous, but it is free of  significant obstructive lesions.  (d) Right coronary:  The right coronary is widely patent, previously  placed distal stent is widely patent.  1. PCI:  LAD in-stent restenosis reduced from 95% with TIMI grade 2      flow to 0% with TIMI grade 3 flow following post dilatation  to 3.5      mm diameter after placement of overlapping 3.0 Cypher stents x2.   CONCLUSION:  1. Recurrent angina, class III secondary to in-stent restenosis,      diffuse,  of previously placed bare-metal Liberte stents.  2. Successful redilatation and implantation of overlapping 3.0 Cypher      stents dilated to 3.5 and 3.25 mm diameter in an overlapping      fashion.  3. Patent diagonal and right coronary stents.  4. Below normal LV function with anterior wall hypokineses.   PLAN:  Hopefully, discharge in a.m.  Resume Coumadin at low dose.  INR  will be in the 1.6 to 2.0 range.  Plavix will need to be continued for  the year.      Lyn Records, M.D.  Electronically Signed     HWS/MEDQ  D:  07/07/2007  T:  07/08/2007  Job:  [id number]   cc:   Soyla Murphy. Renne Crigler, M.D.

## 2010-09-26 NOTE — H&P (Signed)
[name], [name] NO.:  [id number]   MEDICAL RECORD NO.:  [id number]          PATIENT TYPE:  INP   LOCATION:  2035                         FACILITY:  [facility]   PHYSICIAN:  Peter M. Swaziland, M.D.  DATE OF BIRTH:  08-15-1915   DATE OF ADMISSION:  10/24/2006  DATE OF DISCHARGE:                              HISTORY & PHYSICAL   HISTORY OF PRESENT ILLNESS:  [name] is a pleasant 77 year old  white female admitted for evaluation of chest pain.  She has no known  history of coronary artery disease.  She has been evaluated by Dr. Katrinka Blazing  in the past but her ischemic workup is unknown.  She denies having had a  previous cardiac catheterization but does report she is scheduled for a  stress test next month.  Last evening she developed severe pain in her  mid sternal region radiating to both shoulders bilaterally and into her  neck.  Was associated with shortness of breath but no nausea, vomiting  or diaphoresis.  She states this is the worst pain that she has ever  had.  With the first three episode of chest pain she took Mylanta and  got fairly prompt relief, but then an hour to an hour-and-a-half later  her pain would recur.  She subsequently took sublingual nitroglycerin  with relief, but due to repetitive episodes of pain she came into the  emergency room.   PAST MEDICAL HISTORY:  She has had a prior TIA.  She has a history of  chronic atrial fibrillation and is on chronic Coumadin therapy.  She has  a history of hypertension and dyslipidemia.  She has been a prior smoker  and has a history of COPD.  She has a history of gastritis and  gastroparesis.  She has a remote history of peptic ulcer disease with  perforation and had a Billroth procedure.  She has a history of B12  deficiency and osteoporosis.   Her medications were unclear.  She states she is taking Coumadin,  Enablex, Zocor and Protonix.  Other medications listed by her last  discharge summary  include:  1. Cardizem CD 120 mg per day.  2. Calcium 500 mg daily.  3. Reglan 5 mg t.i.d.  4. Imdur 30 mg per day.  5. Advair one puff b.i.d.  6. Zocor 10 mg per day.   She is allergic to DEMEROL, PENICILLIN and SULFA.   SOCIAL HISTORY:  She lives at [facility],  she has been a smoker  in the past but has quit.   FAMILY HISTORY:  Mother died age 16 of a stroke.  Father died age 75 of  a myocardial infarction.  She has two siblings who have had cancer.   REVIEW OF SYSTEMS:  She denies any change in her bowel habits.  She  denies dysuria.  She has had no fever, chills or cough.  She has had no  recurrent neurologic symptoms.   PHYSICAL EXAMINATION:  GENERAL:  The patient is a pleasant, elderly  white female in no apparent distress.  VITAL SIGNS:  Pulse is 67 and irregular.  She is afebrile.  Blood  pressure is 120/76, respirations are normal.  HEENT:  She is normocephalic, atraumatic.  Pupils are equal, round,  reactive to light and accommodation.  Extraocular movements are full.  Oropharynx is clear.  NECK:  Supple without JVD, adenopathy, thyromegaly or bruits.  LUNGS:  Clear to auscultation and percussion.  CARDIAC:  Exam reveals an irregular rate and rhythm without murmur, rub,  gallop or click.  ABDOMEN:  Soft, nontender.  She has an old surgical scar.  There are no  masses.  EXTREMITIES:  Reveal evidence of previous vein stripping.  She has no  edema or phlebitis.  NEUROLOGIC:  Exam is nonfocal.   LABORATORY DATA:  CBC is normal.  I-STAT is normal.  Chest x-ray shows  COPD with no active disease.  ECG shows atrial fibrillation with  occasional PVCs.  There is nonspecific ST abnormality.  Compared to  prior ECG in March 2008, she does have new T-wave inversion in leads V2  through V4.   IMPRESSION:  1. Prolonged recurrent chest pain.  Need to rule out ischemic      etiology.  2. History of significant gastric disease and gastroparesis, gastritis      and  prior Billroth procedure.  3. Hypertension.  4. Dyslipidemia.  5. Chronic atrial fibrillation.  6. Chronic Coumadin therapy.  7. Prior tobacco use   PLAN:  Will admit and rule out for myocardial infarction with serial ECG  and enzymes.  She will be started on IV nitroglycerin.  Will continue  her medicines previously listed in her discharge summary but will hold  Coumadin pending her pro time.  If her pro time is low will need to  start heparin.  Further workup per Dr. Katrinka Blazing in the morning.           ______________________________  Peter M. [place], M.D.     PMJ/MEDQ  D:  10/24/2006  T:  10/25/2006  Job:  [id number]   cc:   Soyla Murphy. Renne Crigler, M.D.  Georgiana Spinner, M.D.

## 2010-09-26 NOTE — Discharge Summary (Signed)
[name], [name]             ACCOUNT NO.:  [id number]   MEDICAL RECORD NO.:  [id number]          PATIENT TYPE:  INP   LOCATION:  1511                         FACILITY:  [facility]   PHYSICIAN:  Peggye Pitt, M.D. DATE OF BIRTH:  12-28-1915   DATE OF ADMISSION:  02/12/2008  DATE OF DISCHARGE:  02/16/2008                               DISCHARGE SUMMARY   DISCHARGE DIAGNOSES:  1. Acute bronchitis, resolved.  2. Persistent atrial fibrillation, on Coumadin.  3. Hypertension.  4. Hyperlipidemia.  5. Vitamin B12 deficiency.  6. Coronary artery disease.  7. History of peptic ulcer disease.  8. Gastritis.   DISCHARGE MEDICATIONS:  1. Prednisone taper starting at 60 mg daily and decreasing by 10 mg      daily until off.  2. Albuterol metered dose inhaler 2 puffs as needed for shortness of      breath.  3. Vitamin B12 1000 mg intramuscular injection every three months.  4. Zocor 40 mg p.o. nightly.  5. Plavix 75 mg p.o. daily.  6. Coumadin 5 mg on Monday, Wednesday, and Friday, and 2.5 mg on      Tuesday, Thursday, Saturday, and Sunday.  7. Nitroglycerin 0.4 mg sublingual p.r.n. chest pain.  8. Tylenol 650 mg t.i.d. p.r.n.  9. Advair 100/50 mcg 1 puff b.i.d.   DISPOSITION/FOLLOWUP:  Patient is discharged back to [facility] in stable condition.  She is not requiring  oxygen and is no longer short of breath and has completed a course of  antibiotics.   CONSULTATIONS THIS ADMISSION:  None.   IMAGING PERFORMED THIS ADMISSION:  1. Chest x-ray on February 12, 2008, consistent with central airway      thickening, compatible with bronchitis.  No evidence of pneumonia.      Small bilateral pleural effusions and stable megaly with aortic      ectasia.  2. Repeat chest x-ray on February 15, 2008 that showed decreasing course      and central bronchovascular markings, consistent with bronchitis,      slight increase in bibasilar atelectasis and small bilateral     pleural effusions again.   HISTORY AND PHYSICAL EXAM:  For full details, please refer to history  and physical exam dictated by myself on February 12, 2008, but in brief,  [name] is a pleasant 77 year old Caucasian woman who came into  the emergency department from her assisted living facility secondary to  cough with yellow sputum production x6 days as well as increased  shortness of breath with fevers up to 101, chills, and malaise.  She had  been started on Avelox on a prednisone taper by her PCP with no  improvement, which is why she was transferred to the hospital for  further evaluation, and we were called.   HOSPITAL COURSE:  1. Acute bronchitis, as evidenced by her clinical findings and her      chest x-ray.  She was continued on the Avelox started by her PCP.      We gave her oxygen, IV fluids, hydration, albuterol and Atrovent      nebs.  We continued her on her Advair, and we placed on      interventricular septum, which we have slowly tapered.  She will      go home on  slow prednisone taper.  She has completed her course of      antibiotics.  She is doing much better and is ready for discharge.  2. All of the rest of her medical issues were stable throughout her      hospitalization.   VITAL SIGNS ON DAY OF DISCHARGE:  Blood pressure 129/68, heart rate 84,  respirations 18, O2 sats 98% on room air.  Temp 97.6.   LABS ON DAY OF DISCHARGE:  Sodium 142, potassium 4.4, chloride 106,  bicarb 32, BUN 11, creatinine 0.70, glucose 106, WBCs 6.4, hemoglobin  11, platelets 242.  She had an INR of 1.8, slightly subtherapeutic.      Peggye Pitt, M.D.  Electronically Signed     EH/MEDQ  D:  02/16/2008  T:  02/16/2008  Job:  [id number]

## 2010-09-26 NOTE — Consult Note (Signed)
NAMESTALEY, BUDZINSKI             ACCOUNT NO.:  [id number]   MEDICAL RECORD NO.:  [id number]          PATIENT TYPE:  INP   LOCATION:  3733                         FACILITY:  [facility]   PHYSICIAN:  Georga Hacking, M.D.DATE OF BIRTH:  04-06-1916   DATE OF CONSULTATION:  DATE OF DISCHARGE:                                 CONSULTATION   HISTORY OF PRESENT ILLNESS:  This 77 year old female is admitted to rule  out  a myocardial infarction. The patient has a previous history of  coronary artery disease and had stenting of the LAD with a Matt Holmes metal  stent in June of 2008. She was later admitted in August of 2008 with  recurrent chest pain and had stenting of a diagonal branch of the distal  right coronary artery also with Matt Holmes metal stents. She has done fairly  well since that time but has had atypical left sided chest discomfort.  She has also had a recent steroidal injection. She had gone to Sunday  school this morning and while sitting in Sunday school, had the onset of  anterior burning substernal chest discomfort that was somewhat different  than her previous angina. The pain became more intense. EMS was called  and she was transported here. The pain is mostly resolved at this time  and an initial EKG as well as initial cardiac enzymes were negative. She  is admitted at this time for further evaluation. She also complains, as  noted, of left sided sharp pain and is worried that there might be  something wrong with her breast or her axilla. She has not had recent  exertional angina but has been somewhat limited because of her back  pain.   PAST MEDICAL HISTORY:  She has a history of a perforated peptic ulcer as  well as reflux. She has chronic atrial fibrillation. She has a history  of chronic obstructive pulmonary disease, hypertension, hyperlipidemia  and CAD. She has a history of lumbar disk disease as well as urinary  frequency. She has also been evaluated recently for some sort of  spells  that occur in the mid morning and she has been taken off of Diltiazem.   PAST SURGICAL HISTORY:  Hip replacement on the right. Surgery for  perforated peptic ulcer previously.   ALLERGIES:  DEMEROL, PENICILLIN, SULFA.   CURRENT MEDICATIONS:  Difficult to assess but says that she is on  Fosamax, aspirin, Zocor, calcium, Prilosec, fish oil, Advair Diskus,  Detrol, oral Coumadin, Vicodin and Percocet.   SOCIAL HISTORY:  She is a widow and lives at [facility]. She  smoked previously but has quit currently.   FAMILY HISTORY:  Mother died at age 20 of a stroke. Father died at age  77 of a MI. Two siblings have had cancer.   REVIEW OF SYSTEMS:  She has had recent episodes where she becomes  hypoglycemic and this has been attributed to Diltiazem that has been  discontinued. She has had recent epidural steroid injection and was off  of Coumadin briefly last week. She has moderate arthritis. She complains  of chronic swelling in her left  leg. She has a history of gastritis as  well as esophageal reflux. She has urinary frequency, for which she  takes Detrol. She has not had any recent glaucoma or cataracts and has a  history of dyspnea and chronic obstructive pulmonary disease.   PHYSICAL EXAMINATION:  GENERAL:  An elderly female who is currently in  no acute distress but is somewhat a poor historian.  VITAL SIGNS:  Blood pressure 150/86, pulse 70 and irregular. Oxygen  saturations were normal.  SKIN:  Warm and dry.  HEENT:  EO MI. PERRLA. CNS clear. Fundi not examined. Pharynx negative.  NECK:  Supple without masses, JVD, thyromegaly or bruits.  LUNGS:  Clear to auscultation and percussion.  CARDIOVASCULAR:  Irregular rhythm. There is no S3, S4, or murmur.  ABDOMEN:  Soft, nontender, and nondistended.  EXTREMITIES:  Dorsalis pedis pulses are 2+ distally. There is 1+  peripheral edema in the left leg.  NEUROLOGIC:  Normal.   LABORATORY DATA:  EKG shows atrial  fibrillation with controlled  response. Minor non-specific changes.   LABORATORY DATA:  Normal CBC, renal panel and negative point of care  enzymes initially.   IMPRESSION:  1. Prolonged chest discomfort in a patient with known coronary artery      disease and recent stenting of multiple vessels.  2. Coronary artery disease with:      a.     Previous bare metal stent of proximal LAD.      b.     Previous bare metal stent of diagonal branch and distal       right coronary artery.  3. Hypertension.  4. History of spinal stenosis and lumbar disk disease.  5. Hyperlipidemia under treatment.  6. Chronic atrial fibrillation on chronic Coumadin therapy.   RECOMMENDATIONS:  She will be continued on Coumadin. We will start her  on intravenous nitroglycerin and check serial enzymes. Continue aspirin.  Further evaluation per Dr. Katrinka Blazing.      Georga Hacking, M.D.  Electronically Signed     WST/MEDQ  D:  06/15/2007  T:  06/16/2007  Job:  [id number]   cc:   Lyn Records, M.D.  Soyla Murphy. Renne Crigler, M.D.

## 2010-09-26 NOTE — H&P (Signed)
[name], ROSILES NO.:  [id number]   MEDICAL RECORD NO.:  [id number]          PATIENT TYPE:  INP   LOCATION:  2916                         FACILITY:  [facility]   PHYSICIAN:  Lyn Records, M.D.   DATE OF BIRTH:  March 10, 1916   DATE OF ADMISSION:  12/18/2006  DATE OF DISCHARGE:                              HISTORY & PHYSICAL   REASON FOR ADMISSION:  Unstable angina.   SUBJECTIVE:  [name] is 22 and presented in mid June with  progressive chest tightness and had mild cardiac enzyme elevation and at  heart catheterization was found to have high-grade proximal LAD  obstruction with TIMI grade 2 flow. This vessel was successfully stented  with bare-metal stents. She also had a 70-80% stenosis in the first  diagonal and a 90% distal RCA stenosis. The RCA beyond the stenosis was  moderate to large in size. She did well after LAD stenting, but over the  past three to four weeks since starting cardiac rehabilitation, she has  noticed a different type of chest heaviness and dyspnea on exertion.  This is distinctly different than that which caused her admission in  June. Today after awakening, she began having pressure/tightness in her  chest. She went to cardiac rehabilitation. At cardiac rehab, the  discomfort became more severe, and there was decrease in her blood  pressure, at times being only 90 systolic. She was sent to our office  where we evaluated her, and though there were no acute EKG changes, we  admitted her with unstable angina. She is currently having level 1 to  2/10 discomfort, but it is significantly improved now on IV  nitroglycerin.   SIGNIFICANT PAST MEDICAL PROBLEMS:  1. Coronary arthrosclerotic heart disease with bare-metal LAD stents      recently deployed in October 25, 2006 and residual first diagonal and      right coronary disease.  2. Gastroesophageal reflux.  3. Chronic atrial fibrillation on Coumadin therapy.  4. COPD.  5.  Hypertension.   MEDICATIONS:  1. Detrol LA 4 mg daily.  2. Cardizem CD 120 mg per day.  3. Aspirin 81 mg per day.  4. Tylenol 650 mg q.4h. p.r.n.  5. Nitroglycerin 0.4 mg sublingually p.r.n.  6. Prilosec 20 mg 2 b.i.d.  7. Metamucil daily.  8. Zocor 10 mg per day.  9. Isosorbide 30 mg per day.  10.Fosamax 1 table weekly.  11.Vitamin B12 every 3 months.  12.Calcium 600 mg with vitamin D daily.  13.Fish oil 1200 mg b.i.d.  14.Coumadin 2.5 mg on Tuesday and Saturday; 5 mg on Sunday, Monday,      Wednesday, Thursday and Friday.  15.Glucosamine chondroitin daily.   ALLERGIES:  None known.   SOCIAL HISTORY:  Prior smoker. Otherwise review to H&P done in June of  2008.   FAMILY HISTORY:  Noncontributory. Refer to H&P done in June of 2008.   REVIEW OF SYSTEMS:  Otherwise, the patient is doing well. She has had no  difficulty. Shortness of breath she feels is getting somewhat worse.   On exam, the patient is no acute distress.  Her blood pressure is 108/58,  heart rate is 70. She weighs 140 pounds.  SKIN:  Is warm and dry. No cyanosis.  HEENT EXAM:  Is unremarkable.  No significant JVD is noted.  LUNGS:  Are clear.  CARDIAC EXAM:  No murmur, no rub, no click.  ABDOMEN:  Soft.  EXTREMITIES:  1+ edema.  NEUROLOGIC EXAM:  Is intact.   EKG reveals atrial fibrillation, no acute ST-T wave change. Occasional  PVC is noted.   ASSESSMENT:  1. Progressive angina in this patient with recent unstable angina      treated with bare-metal stents in the proximal LAD, residual first      diagonal and distal right coronary artery is probably the etiology      of the patient's current symptoms, especially since the quality of      discomfort in her chest is entirely different than that on      presentation with the LAD.  2. Chronic atrial fibrillation on Coumadin therapy.  3. Blood pressure under good control.   PLAN:  1. Hold Coumadin.  2. Start IV heparin.  3. Start Plavix.  4. Markers  to rule out myocardial infarction.  5. Get portable chest x-ray.  6. If we can cool her off with medications, we may not do anything      further, although these symptoms have been progressive, and I feel      that perhaps distal right coronary stenting would relieve, as I      suspect that the distal right is the likely culprit. Will speak      with the patient and her family concerning this and our approach      based on her clinical course.      Lyn Records, M.D.  Electronically Signed     HWS/MEDQ  D:  12/18/2006  T:  12/18/2006  Job:  [id number]   cc:   Soyla Murphy. Renne Crigler, M.D.

## 2010-09-26 NOTE — H&P (Signed)
NAMEESRA, [name] NO.:  [id number]   MEDICAL RECORD NO.:  [id number]          PATIENT TYPE:  INP   LOCATION:  2034                         FACILITY:  [facility]   PHYSICIAN:  Cassell Clement, M.D. DATE OF BIRTH:  [birth year]   DATE OF ADMISSION:  07/04/2007  DATE OF DISCHARGE:                              HISTORY & PHYSICAL   CHIEF COMPLAINT:  Chest pain.   HISTORY:  This is an 77 year old widowed Caucasian female who is a  resident at [facility] assisted living facility.  She is  admitted, brought in by EMS after having prolonged chest pain at about  noon today.  The patient does have a history of known ischemic heart  disease.  She had a stent to her LAD in June 2008 which was a bare-metal  stent and in August 2008 had a stent which is a bare-metal stent to the  right coronary artery and diagonal.  The patient was admitted on  June 15, 2007, and discharged June 16, 2007, for prolonged chest  pain and she ruled out for an MI at that time.  She subsequently saw Dr.  Verdis Prime in the office 3 days ago at which time her Imdur was  increased to 120 mg each evening.  Today at around lunchtime the patient  developed severe interscapular pain at rest which radiated to the  precordium, down the right arm.  Pain was relieved after three  nitroglycerin.  She was brought to the emergency room by EMS.  The  patient denies nausea, vomiting or diaphoresis.  Of note is the fact  that the patient has a past history of chronic atrial fibrillation and  has been on Coumadin.  She has a history of hypercholesterolemia and  hypertension.   SOCIAL HISTORY:  Reveals that she is a widow.  She is a former smoker.   FAMILY HISTORY:  Reveals her mother died at 13 of a stroke.  Her father  died at 38 of a heart attack.  She has two siblings that have had  cancer.   CURRENT MEDICATIONS:  1. Fosamax 70 mg daily.  2. Aspirin 81 mg daily.  3. Zocor 20 mg daily.  4.  Caltrate 600 with D once a day.  5. Prilosec 20 mg twice a day.  6. Advair Diskus one puff 100/50 twice a day.  7. Detrol LA 4 mg daily.  8. Coumadin 5 mg five days a week and 2.5 mg two days a week.  9. Imdur 120 mg each evening.   ALLERGIES:  She is allergic to AMPICILLIN, DEMEROL and SULFA.   PAST SURGICAL HISTORY:  Reveals she has had a previous hip replacement  on the right and she has had a past history of perforated peptic ulcer  surgery.  She also has a history of COPD and a history of lumbar disk  disease and also has had spinal stenosis.  She has had some recent  epidural steroid injections.  She has had some chronic swelling of the  left leg and a history of arthritis.  She has had urinary frequency for  which she takes Detrol.   PHYSICAL EXAMINATION:  Her blood pressure is 150/100, pulse is 61 and  regular, respirations are normal, O2 saturation is 98%.  GENERAL APPEARANCE:  Reveals an elderly woman in no acute distress.  Skin is warm and dry.  Pupils equal and reactive.  Jugular venous  pressure normal.  Carotids normal.  Thyroid normal.  CHEST:  Clear.  HEART:  Reveals no murmur, gallop, rub or click.  ABDOMEN:  Soft without hepatosplenomegaly or masses.  EXTREMITIES:  Show trace edema of the left foot and pretibial area.  Pedal pulses are present.  NEUROLOGICALLY:  She is intact.   Her EKG shows atrial fibrillation with a slow ventricular response and  nonspecific ST-T wave abnormalities.  Chest x-ray shows mild  cardiomegaly and no active disease.  Initial blood work is negative  including initial cardiac enzymes and i-STAT.  Her INR is 2.9.   IMPRESSION:  1. Chest pain, rule out myocardial infarction.  2. Known ischemic heart disease with history of bare-metal stents to      the left anterior descending artery in June 2008 and to the right      coronary artery/diagonal in August 2008.  3. Chronic atrial fibrillation.  4. Chronic Coumadin anticoagulation.  5.  History of spinal stenosis.  6. History of hypertensive cardiovascular disease.  7. History of perforated peptic ulcer.  8. History of hyperlipidemia.   DISPOSITION:  With the patient presenting with a second ER presentation  for chest pain within 3 weeks, we will admit to telemetry.  We will hold  her Coumadin, transition with Lovenox if her INR drops below 2, and  anticipate cardiac catheterization Monday by Dr. Verdis Prime.  Will get  serial enzymes.  Will treat with aspirin, statins, beta blocker and  Lovenox.           ______________________________  Cassell Clement, M.D.     TB/MEDQ  D:  07/04/2007  T:  07/06/2007  Job:  [id number]   cc:   Lyn Records, M.D.  Soyla Murphy. Renne Crigler, M.D.

## 2010-09-26 NOTE — Discharge Summary (Signed)
[name], [name] NO.:  [id number]   MEDICAL RECORD NO.:  [id number]          PATIENT TYPE:  INP   LOCATION:  2926                         FACILITY:  [facility]   PHYSICIAN:  Lyn Records, M.D.   DATE OF BIRTH:  1915/12/11   DATE OF ADMISSION:  10/24/2006  DATE OF DISCHARGE:  10/29/2006                               DISCHARGE SUMMARY   DISCHARGE DIAGNOSES:  1. Coronary artery disease status post bare metal stent to the left      anterior descending, ejection fraction of 50%.  2. Hypertension.  3. Dyslipidemia.  4. Chronic Coumadin use.  5. Chronic atrial fibrillation.  6. Hypokalemia repleted.  7. Allergy to penicillin, Demerol, and sulfa.  8. Chronic obstructive pulmonary disease.  9. Gastritis.  10.Gastroparesis.  11.Vitamin B12 deficiency.  12.Osteoporosis.  13.History of transient ischemic attack.   HISTORY:  [name] is an 77 year old female who was admitted on  October 24, 2006, with substernal chest pain.  Her laboratory studies  showed normal cardiac isoenzymes.  She is on long-term Coumadin therapy  for chronic atrial fibrillation and we let her INR drift down back to  normal before proceeding with cardiac catheterization.   LABORATORY DATA:  During hospital stay include white count 4.4,  hemoglobin 12.12, hematocrit 35.9, platelets 163.  PT just prior cath  was 18.2 with INR 1.5.  LFTs normal.  Sodium 137, potassium 3.8, BUN 9,  creatinine 0.62, total cholesterol 121, triglycerides 62, HDL 62, VLDL  12, and LDL 47.   Her EKG showed atrial fibrillation rate 77 with isolated premature  ventricular contraction, nonspecific inferolateral ST-T wave changes  with T wave inversion anteriorly.  Chest x-ray no acute disease.   The patient ultimately received cardiac catheterization on October 28, 2006  and was found to have the following:  Left main with 40% osteal  calcification, LAD with a diagonal 70%, calcified.  There was a 99%  proximal LAD  lesion with TIMI2 flow.  Circumflex had 3 obtuse marginal  branches.  There was a 50% mid circumflex lesion.  Right coronary artery  had a 40% proximal/mid lesion with a 90% distal lesion as well.  The  patient then underwent a bare mental stent placement to the LAD  resulting in 0% stenosis post procedure with TIMI3 flow.  The patient  with Angio-Seal.  The patient remained in the hospital overnight.  Laboratory studies at that point were only remarkable for hypokalemia at  3.5, potassium was replaced.   DISPOSITION:  The patient was discharged to home in stable condition.   DISCHARGE MEDICATIONS:  On the following medications:  1. Enteric coated aspirin 81 mg daily.  2. Cardizem CD 120 mg daily.  3. Calcium daily.  4. Reglan 3 times a day as prior to admission.  5. Imdur 30 mg daily.  6. Advair one inhalation twice a day.  7. Zocor 10 mg daily.  8. Coumadin as prior to admission.  9. We did given her 10 mg of Coumadin just prior to her discharge,      since her INR was normal.  10.She is  also on Plavix 75 mg a day.  11.Sublingual nitroglycerin p.r.n. chest pain.   DISCHARGE INSTRUCTIONS:  She is to have her Coumadin level checked at  [facility] this week.  Clean cath site gently with soap and  water.  Increase activity slowly. No lifting for one week over 10  pounds.  No driving for 2 days if she currently drives.  She is to  followup with Dr. Verdis Prime on November 19, 2006 at 1:15 p.m.      [name], P.A.      Lyn Records, M.D.  Electronically Signed    LB/MEDQ  D:  10/29/2006  T:  10/29/2006  Job:  [id number]   cc:   Soyla Murphy. Renne Crigler, M.D.  Georgiana Spinner, M.D.  Lyn Records, M.D.  [facility]

## 2010-09-26 NOTE — Discharge Summary (Signed)
[name], [name] NO.:  [id number]   MEDICAL RECORD NO.:  [id number]          PATIENT TYPE:  INP   LOCATION:  2916                         FACILITY:  [facility]   PHYSICIAN:  Lyn Records, M.D.   DATE OF BIRTH:  1916-04-12   DATE OF ADMISSION:  12/18/2006  DATE OF DISCHARGE:  12/24/2006                               DISCHARGE SUMMARY   DISCHARGE DIAGNOSES:  1. Acute coronary syndrome, resolved.  2. Coronary artery disease, status post stent placement in right      coronary artery and first diagonal.  3. Chronic atrial fibrillation.  4. Long-term Coumadin use.  5. Long-term medication use.  6. Chronic obstructive pulmonary disease.  7. Hypertension.  8. Gastroesophageal reflux disease.   HISTORY OF PRESENT ILLNESS:  [name] is an 77 year old female  with known coronary artery disease.  In June 2008, she presented with  high-grade LAD disease and underwent successful stenting with bare metal  stent.  At cardiac rehabilitation on the day of admission, she began  having shortness of breath and chest pressure.  She was seen then seen  in the office and then subsequently admitted to [facility].  She ruled out for a myocardial infarction, however, with progressive  symptoms and the fact that her pain was better with nitroglycerin, she  ultimately underwent cardiac catheterization on December 23, 2006, and was  found to have left main patent, LAD patent stent with no further LAD  changes from previous catheterization, circumflex 50% up to the obtuse  marginal-2, RCA with multiple luminal irregularities obstructing the  vessel up to 50% in the mid lesion, however, there is a 90% distal  lesion.  Of note, there is also a proximal and diagonal-1 lesion that is  90%.   HOSPITAL COURSE:  The patient underwent success bare metal stent  placement to the distal RCA reducing that 90% lesion to 0 post  procedure.  She also underwent a bare metal stent  deployment of the  proximal diagonal-1 resulting in 0 stenosis post procedure with TIMI-3  flow.  The patient remained in the hospital overnight and was ready for  discharge to home the following day.   DISCHARGE LABORATORY DATA AND X-RAY FINDINGS:  Potassium 3.9, BUN 10,  creatinine 0.4.  PT 15.9, INR 1.2.  Hemoglobin 14.2, hematocrit 41.6,  platelets 185, white count 4.9.  TSH 1.201.   DISCHARGE MEDICATIONS:  1. Coumadin, continue same home dose.  2. Enteric coated aspirin 81 mg a day.  3. Plavix 75 mg a day x1 month.  4. Sublingual nitroglycerin p.r.n. chest pain.  5. Detrol LA 4 mg a day.  6. Cardizem CD 120 mg a day.  7. Zocor 20 mg a day.  8. Omega-3 fish oil 1200 mg twice a day.  9. Fosamax weekly.  10.Glucosamine, calcium, vitamin B12 daily.  11.Fosamax weekly.  12.Prilosec 20 mg twice a day.   FOLLOW UP:  Follow up with Dr. Dellia Cloud, N.P., on January 08, 2007, at 10 a.m.   WOUND CARE:  Clean catheterization site gently with soap and water.  No  scrubbing.   DIET:  Remain on a low sodium, heart-healthy diet.   SPECIAL INSTRUCTIONS:  She is to stop her Imdur.  Call for any further  questions or concerns.      [name], P.A.      Lyn Records, M.D.  Electronically Signed    LB/MEDQ  D:  12/24/2006  T:  12/24/2006  Job:  [id number]   cc:   [facility]

## 2010-09-29 NOTE — Discharge Summary (Signed)
NAMELELIANA, KONTZ             ACCOUNT NO.:  [id number]   MEDICAL RECORD NO.:  [id number]          PATIENT TYPE:  INP   LOCATION:  3009                         FACILITY:  [facility]   PHYSICIAN:  Pramod P. Pearlean Brownie, MD    DATE OF BIRTH:  04-01-1916   DATE OF ADMISSION:  01/24/2006  DATE OF DISCHARGE:  01/28/2006                                 DISCHARGE SUMMARY   DIAGNOSES AT TIME OF DISCHARGE:  1. Right brain transient ischemic attack.  2. Atrial fibrillation.  3. Hypertension.  4. Dyslipidemia.  5. History of smoking; quit 15 years ago, but has a 25-30 pack year      history of smoking.  6. Gastritis.  7. Vitamin B-12 deficiency.  8. Chronic obstructive pulmonary disease.  9. Gastrointestinal dysmotility.  10.Osteoporosis.  11.Peptic ulcer disease with perforation.  12.History of Billroth procedure for perforated stomach.  13.History of vein stripping.   MEDICATIONS AT THE TIME OF DISCHARGE:  1. Coumadin 2.5 mg Sunday, Monday, Wednesday, Friday; 5 mg Tuesday,      Thursday, Saturday.  2. Cardizem 120 mg CD a day.  3. Protonix 80 mg a day.  4. Calcium carbonate 500 mg a day.  5. Reglan 5 mg t.i.d.  6. Imdur 30 mg a day.  7. Advair one puff b.i.d.  8. Zocor 10 mg a day.  9. Fosamax 75 mg a week.   STUDIES PERFORMED:  1. CT of the brain on admission shows no acute abnormality, chronic      microvascular ischemic change with _________ disease.  2. MRI of the brain shows age-related atrophy and chronic microvascular      disease, no acute abnormality.  3. MRA of the head shows all major intracranial vessels are patent.  4. EKG shows atrial fibrillation with aberrantly conducted complexes,      nonspecific ST-T wave abnormality, likely digitalis effect.  5. 2-D echocardiogram shows an ejection fraction of 55% with no left      ventricular regional wall motion abnormality.  6. EEG shows no seizure activity, though there are 1-2 second cardiac      pauses.  7. Carotid Doppler  and transcranial Doppler both ordered; results not      available in the chart.  Question if they have been performed.  Will      attempt to get carotid Doppler prior the discharge.   LABORATORY STUDIES:  INR on the day of discharge was 2.0.  CBC normal,  sedimentation rate 2.  INR on admission 1.8.  Chemistry - total protein of  5.9, albumin 3.3.  Otherwise, normal liver function tests, normal hemoglobin  A1C at 5.4, homocystine 13.4, chloride 199, triglycerides 72, HDL 68, LDL  117.  Urinalysis normal.   HISTORY OF PRESENT ILLNESS:  Ms. Baily Hovanec is an 77 year old Caucasian  female with a history of chest pain, most recently evaluated by Dr. Garnette Scheuermann, with no signs of ischemic disease.  The patient complains of  recurrent episodes of shortness of breath and tingling across her chest,  diffuse weakness, headache and at times confusion.  On the day of  admission,  she had a brief period of weakness of her left hand where she was unable to  move it.  She thinks that it was seconds to no more than a few minutes.  There were no other associated focal signs of weakness, and she had total  and complete resolution of her symptoms by the time she reached the  emergency room.  She has risk factors of atrial fibrillation, hypertension,  smoking and dyslipidemia.  She was admitted to the hospital for further  stroke evaluation.  She was not a TPA candidate secondary to quick  resolution of symptoms.   HOSPITAL COURSE:  Initial INR was 1.8.  The patient had inadvertently been  off Coumadin for several days prior to admission, and had resumed her  Coumadin at home and is on her way up.  The patient was kept NPO, as she was  admitted with stroke symptoms, and did no pass her nurse bedside swallow  screen.  She was then sent to speech therapy for a modified barium swallow  where she did have some aspiration, though she was able to tolerate a thin  liquid diet with chin tuck.  Her Coumadin was  resumed at that time, but her  INR had dropped to 1.6, so IV heparin was started.  The patient had an EEG  that did rule out seizure; however, it showed 1-2 second pauses.  Dr. Katrinka Blazing  stopped by and left a note that the patient has had a Holter monitor that  was unremarkable as an outpatient.  Discussed with [place], and he agrees  that 1-2 second pauses are not clinically significant, as the patient was  asymptomatic.  Will have the patient followup with him as per her routine.  The patient became therapeutic on her Coumadin in the hospital, and she was  returned to her home regimen.  She will discharge to [facility]  where she will have followup of her INR, as well as speech therapy followup  for swallowing difficulties.   CONDITION ON DISCHARGE:  The patient alert and oriented x3.  No aphasia or  dysarthria.  Her eye movements are full.  Her face is symmetric.  She moves  all 4 extremities well.  She has no focal neurologic deficits.   DISCHARGE PLAN:  1. [facility] with [facility].  2. Followup INR at [facility] in 2 days, and then as needed to keep INR      greater than 2.  3. Followup with Dr. Pearlean Brownie in 2-3 months.  4. Followup with Dr. Katrinka Blazing as per his recommendations.  5. Followup with Dr. Renne Crigler in 1 month.      Annie Main, N.P.    ______________________________  [name]. Pearlean Brownie, MD   SB/MEDQ  D:  01/28/2006  T:  01/28/2006  Job:  [id number]   cc:   Lyn Records, M.D.  Soyla Murphy. Renne Crigler, M.D.  [facility]

## 2010-09-29 NOTE — Discharge Summary (Signed)
[name], [name]             ACCOUNT NO.:  [id number]   MEDICAL RECORD NO.:  [id number]          PATIENT TYPE:  INP   LOCATION:  3009                         FACILITY:  [facility]   PHYSICIAN:  Pramod P. Pearlean Brownie, MD    DATE OF BIRTH:  1915/07/04   DATE OF ADMISSION:  01/24/2006  DATE OF DISCHARGE:  01/28/2006                                 DISCHARGE SUMMARY   DISCHARGE DIAGNOSES:  1. Right brain transient ischemic attack.  2. Atrial fibrillation.  3. Hypertension.  4. History of smoking, quit 15 years ago.  5. Gastritis.  6. Vitamin B12 deficiency.  7. Chronic obstructive pulmonary disease.  8. Gastrointestinal dysmotility.  9. Osteoporosis.  10.Peptic ulcer disease with perforation history.  11.Status post Billroth procedure for perforated stomach.  12.__________  vein stripping.   DISCHARGE MEDICATIONS:  1. Cardizem 120 mg CD a day.  2. Protonix 80 mg a day.  3. Calcium carbonate 500 mg a day.  4. Reglan 5 mg t.i.d.  5. Cipro 250 mg a day.  6. Imdur 30 mg a day.  7. Advair inhaler 1 puff b.i.d.  8. Zocor 10 mg a day.  9. Coumadin.   Dictation ended at this point.      Annie Main, N.P.    ______________________________  [name]. Pearlean Brownie, MD    SB/MEDQ  D:  01/28/2006  T:  01/28/2006  Job:  [id number]   cc:   [facility] Vanessa Meridian, M.D.  Soyla Murphy. Renne Crigler, M.D.

## 2010-09-29 NOTE — H&P (Signed)
[name], RAVERT             ACCOUNT NO.:  [id number]   MEDICAL RECORD NO.:  [id number]          PATIENT TYPE:  INP   LOCATION:  2022                         FACILITY:  [facility]   PHYSICIAN:  Hind I Elsaid, MD      DATE OF BIRTH:  1915-08-02   DATE OF ADMISSION:  07/17/2006  DATE OF DISCHARGE:                              HISTORY & PHYSICAL   PRIMARY CARE PHYSICIAN:  Soyla Murphy. Renne Crigler, M.D.   CARDIOLOGIST:  Lyn Records, M.D.   NEUROLOGIST:  Sonny Dandy, M.D.   CHIEF COMPLAINT:  Chest heaviness and tightness for one day and  shortness of breath.   HISTORY OF PRESENT ILLNESS:  An 77 year old female from a nursing home  with a history of atrial fibrillation, for heart rate control.  History  of chest pain, which was recently evaluated by Dr. Verdis Prime with no  evidence of ischemic disease.  Also history of transient ischemic attack  in 2007.  The patient is chronically on aspirin with therapeutic INR  today.  The patient was admitted to the hospital with the chief  complaint that started today with chest heaviness and also said chest  pain x2.  The pain radiated to the neck and to the shoulders, mainly to  the right shoulder, associated with mild shortness of breath mainly on  exertion.  Also, patient admitted yesterday, she had a bizarresymptoms  where she had suddenly become clammy, with sudden headache and clammy,  diffuse weakness, headache, and she felt there was some kind of  deviation on her right hip to the right, which was completely resolved  in 1 to 2 minutes.  Today, the patient complained of mainly just  heaviness, which was resolved when I saw the patient in the emergency  room.  Condition not associated with palpitation.  No nausea or  vomiting.  The patient denies any palpitation. Chest pain lasted around  a few seconds and is gone, but she complained of heaviness.  The patient  denies any nausea or vomiting.  Denies any abdominal pain.  Denies any  fever.  Denies any change in bowel habit.  Denies any numbness or  weakness of the extremities.  Denies any neck stiffness.   PAST MEDICAL HISTORY:  1. History of right transient ischemic attack.  2. History of atrial fibrillation.  3. History of hypertension.  4. History of peptic ulcer disease status post surgery.  5. History of vitamin B12 deficiency.  6. History of COPD.  7. Gastroesophageal dysmotility.  8. Osteoporosis.  9. History of dyslipidemia.   MEDICATIONS:  1. Coumadin 2.5 mg p.o. daily.   1. Tramadol 50 mg p.o.  2. Fosamax 70 mg p.o. weekly.  3. Glucosamine/chondroitin capsule by mouth 1-2 times daily.  4. Metamucil powder 1 scoop with large glass of liquid once daily.  5. Advair 100/50 Diskus one puff every 12 hours.  6. Fish oil/omega 3 one capsule by mouth twice daily.   ALLERGIES:  DEMEROL, AMPICILLIN, AND SULFA.   PAST SURGICAL HISTORY:  1. History of perforated ulcer status post procedure for perforated  stomach.  2. Vein stripping.   REVIEW OF SYSTEMS:  Is seen that from previous record. The patient had a  recurrent episode of complaints in the history of present illness over  the past several days and over the last admission too.  She had chronic  headache of unknown cause.   FAMILY HISTORY:  Mother died of cerebrovascular accident age 26.  Father  died of myocardial infarction at age 84.  The patient had a maternal  great aunt who had a stroke which disabled her.  She had a sister who  died of remote cancer of unknown source and brother who died of cancer  of the throat.   SOCIAL HISTORY:  No alcohol use.  History of smoking.  She lives in an  assisted living facility.   PHYSICAL EXAMINATION:  VITAL SIGNS:  Temperature 97.7, pulse rate 91,  respiratory rate 18, pulse ox 95% on room air.  GENERAL:  The patient is lying comfortable in bed in no obvious distress  or pain.  HEENT:  Normocephalic, atraumatic.  NECK:  No bruits and supple.   LUNGS:  Clear.  HEART:  No murmur.  Pulse normal.  ABDOMEN:  Soft.  Bowel sounds positive. No organomegaly.  EXTREMITIES:  There is mild swelling of the left lower extremity with  some evidence of venous stripping.  NEUROLOGICAL:  The patient is alert and awake.  Cranial nerves II-XII  look normal.  Extraocular muscle movement are normal.  There is  symmetric facial strength.  MOTOR EXAMINATION:  Normal strength.  Tone is normal.  SENSATION:  No sensory loss.  CEREBELLAR EXAMINATION:  Good finger-to-nose, heel-to-knee.  Gait not  tested.   LABORATORY DATA:  EKG showed atrial fibrillation and nonspecific ST and  T wave abnormalities.   White blood cells 5.7, hemoglobin 14.2, hematocrit 92.6, and platelets  182.  INR was 2.2, sodium 139, potassium 4.2, chloride 108, BUN 14, and  creatinine 0.8, CK-MB and troponin x1 negative.  Bicarb 24.6 and pH  7.41, creatinine 0.8.   ASSESSMENT AND PLAN:  1. Chest pain with shortness of breath.  The patient has been worked      up in the past for the ischemic etiology and, by her recall, there      is no evidence of any ischemic event.  Anyhow, we will admit the      patient to telemetry for evaluation for unstable angina.  We will      get serial EKG and cardiac enzymes.  We will get a 2D echo as the      patient has previous 2D echo done on January 24, 2006, with a      normal ejection fraction of 55% where there was no left ventricular      wall motion abnormalities.  We will consider cardiology consult if      patient needs.  2. History of mild deviation yesterday, which has completely resolved.      We will rule out transient ischemic attack.  We will get a CT of      the head and we will get a lipid profile and we will continue with      the Coumadin.  3. Hypertension.  Blood pressure was stable.  4. Atrial fibrillation. Rate under control.  According to the nursing     home the patient is not on any calcium channel blocker.  We will       admit and monitor the patient and if  required we will start the      patient.      Hind Bosie Helper, MD  Electronically Signed     HIE/MEDQ  D:  07/17/2006  T:  07/18/2006  Job:  [id number]   cc:   Lyn Records, M.D.  Soyla Murphy. Renne Crigler, M.D.

## 2010-09-29 NOTE — Procedures (Signed)
EEG# [id number]   HISTORY:  This is an 77 year old with a transient ischemic attack who is  having EEG done to evaluate for seizures.   PROCEDURE:  This is a routine EEG.   TECHNICAL DESCRIPTION:  Throughout this routine EEG there is a posteriorly  dominant rhythm of 11 Hertz activity. At 15 to 30 microvolts the background  activity is symmetric mostly in upper alpha and beta range activity at 10 to  25 microvolts.  With photic stimulation there is a symmetric photic driving  response noted. Hyperventilation was not performed throughout this  recording. The patient did not go to sleep during this tracing. Throughout  this record there is no evidence of electrographic seizures or intraictal  discharge activity. On notices on the EKG tracing the patient has an  irregular rhythm with occasional 1 to 2 second pauses noted.   IMPRESSION:  This routine EEG is within normal limits in the awake state.  On EKG tracing the rhythm is irregular with occasional 1 to 2 second pauses.  Clinical correlation is advised.      Bevelyn Buckles. Nash Shearer, M.D.  Electronically Signed     NWG:NFAO  D:  01/25/2006 15:05:03  T:  01/26/2006 21:26:36  Job #:  [id number]

## 2010-09-29 NOTE — Op Note (Signed)
[name], [name]             ACCOUNT NO.:  [id number]   MEDICAL RECORD NO.:  [id number]          PATIENT TYPE:  AMB   LOCATION:  ENDO                         FACILITY:  [facility]   PHYSICIAN:  Georgiana Spinner, M.D.    DATE OF BIRTH:  [birth year]   DATE OF PROCEDURE:  10/09/2005  DATE OF DISCHARGE:                                 OPERATIVE REPORT   PROCEDURE:  Upper endoscopy.   INDICATIONS:  Abdominal pain.   ANESTHESIA:  Versed 2 mg.   DESCRIPTION OF PROCEDURE:  With the patient mildly sedated in the left  lateral decubitus position, the Olympus videoscopic endoscope was inserted  in the mouth and passed under direct vision through the esophagus which  appeared normal.  Into the stomach, initially fundus and body appeared  normal.  Antrum showed that she had a previous Billroth anastomosis but this  was not clear because her antrum was intact as was the pylorus.  We went  through the pylorus into the duodenal bulb and pulled back and went  posteriorly through the anastomosis and found the small bowel tissue as well  appeared normal there.  We then biopsied around the prepyloric area and the  anastomotic area because of the white discoloration of the mucosa.  It was  photographed.  We placed the endoscope in retroflexion to view the stomach  from below, and in retroflexed view, there appeared to be possibly a large  amount of tissue, either thickened fold but it could have been foreign body.  I could not get a good visualization despite washing of it, but it seemed to  move indicating to me that it might be foreign body but I biopsied it and  then also biopsied high up in the fundus which was fairly erythematous.  The  endoscope was then straightened and withdrawn.  The patient's vital signs  and pulse oximetry remained stable.  The patient tolerated the procedure  well and without apparent complication.   FINDINGS:  Whitish discoloration of the mucosa around the prepyloric area  and the gastric side of the anastomosis to small bowel which may be  metaplasia.  Additionally, erythematous changes of the fundus biopsied and  question of foreign body versus thickened fold in the stomach biopsied.  Await biopsy reports.  The patient will call me for results and follow up  with me as an outpatient.           ______________________________  Georgiana Spinner, M.D.     GMO/MEDQ  D:  10/09/2005  T:  10/09/2005  Job:  [id number]

## 2010-09-29 NOTE — Discharge Summary (Signed)
NAMEBRANDEY, [name]             ACCOUNT NO.:  [id number]   MEDICAL RECORD NO.:  [id number]          PATIENT TYPE:  INP   LOCATION:  2022                         FACILITY:  [facility]   PHYSICIAN:  Hind I Elsaid, MD      DATE OF BIRTH:  12-Feb-1916   DATE OF ADMISSION:  07/17/2006  DATE OF DISCHARGE:  07/18/2006                               DISCHARGE SUMMARY   DISCHARGE DIAGNOSES:  1. Chest pain, myocardial infarction was ruled out.  Further workup to      be done by Dr. Verdis Prime.  2. Hypertension.  3. History of atrial fibrillation, rate under control.  4. History of right transient ischemic attack.  5. History of peripheral ulcer disease status post surgery.  6. History of vitamin B12 deficiency.  7. History of chronic obstructive pulmonary disease.  8. Osteoporosis.  9. History of dyslipidemia.  10.Small hemorrhagic lesion in her left foot.  Need to be addressed by      Dr. Renne Crigler as outpatient for possibility of biopsy.   DISCHARGE MEDICATIONS:  1. Cardizem 120 mg p.o. daily.  2. Os-Cal 500 mg p.o. daily.  3. Zocor 10 mg p.o. q.h.s.  4. Fish oil  omega-3 1000 mg p.o. b.i.d.  5. Advair 100/50 mcg one puff q.12.  6. Vitamin B12 3000 mcg IM every three months.  7. Topamax 70 mg p.o. once weekly.  8. Coumadin 2.5 mg p.o. Tuesday, Wednesday, Thursday, Saturday and      Sunday.  9. Coumadin 5 mg p.o. Monday and Friday.  10.Aspirin 81 mg p.o. daily.   PROCEDURES:  1. Chest x-ray:  Stable, no acute cardiopulmonary process.  2. CT head without contrast:  No acute intracranial findings, no      evidence of acute stroke .Carotid duplex:  No intracranial aortic      stenosis.  3. Renal Doppler:  No evidence of DVT, superficial thrombosis or      Baker's cyst.  There is left lower extremity fluid-filled structure      was noted behind the knee, to the proximal calf consistent with      ruptured Baker's cyst.   CONSULTATIONS:  Cardiology consulted for chest pain.   HISTORY OF  PRESENT ILLNESS:  Please review the history for 77 year old  female from nursing home, history ofatrial fibrillation, heart rate  under control, history of chest pain which was evaluated by Dr. Verdis Prime with no evidence of ischemic disease, no report was available.  Patient is chronically on aspirin and Coumadin, presented to the  hospital with chest pain and we are admitted for evaluation of chest  pain.  Patient was admitted to telemetry.  Cardiac enzymes and 2-D echo  were done which show hypokinesis of the posterior wall, the ventricular  systolic function was mildly decreased, left ventricular ejection  fraction  to 50%, left ventricular wall thickening was mildly increased,  aortic valve thickness was mildly increased, there was lower to normal  aortic valve leaflet excoriation.   1. During hospitalization, cardiology was consulted for evaluation of      the chest pain where they  recommend to be discharged home and to      follow up with Dr. Verdis Prime within one to two weeks for      evaluation of the chest pain.  During hospitalization, chest pain      resolved and there is no evidence of EKG findings for any evidence      of acute MI.  Patient will be discharged home and follow with Dr.      Verdis Prime within one week for further evaluation.  Patient may      need cardiac cath in the future.  2. Atrial fibrillation with heart rate under control.  Continue with      her current treatment.  3. Multiple history of TIA.  No neurological evidence of acute stroke.      Patient on chronic Coumadin and aspirin; will continue with her      current treatment.  4. History of Baker's cyst rupture.  Patient is asymptomatic.  Follow      up as outpatient.  5. Small hemorrhagic nodule in the left foot is to be addressed by Dr.      Renne Crigler as outpatient for possibility of biopsy.      Hind Bosie Helper, MD  Electronically Signed     HIE/MEDQ  D:  12/26/2006  T:  12/26/2006  Job:   [id number]   cc:   Lyn Records, M.D.  Soyla Murphy. Renne Crigler, M.D.

## 2010-09-29 NOTE — H&P (Signed)
NAMEANNAIS, CRAFTS             ACCOUNT NO.:  [id number]   MEDICAL RECORD NO.:  [id number]          PATIENT TYPE:  EMS   LOCATION:  MAJO                         FACILITY:  [facility]   PHYSICIAN:  Deanna Artis. Hickling, M.D.DATE OF BIRTH:  01/26/1916   DATE OF ADMISSION:  01/24/2006  DATE OF DISCHARGE:                                HISTORY & PHYSICAL   CHIEF COMPLAINT:  Left hand transiently weak.   HISTORY OF PRESENT ILLNESS:  An 77 year old woman with a history of chest  pain, recently evaluated by Dr. Katrinka Blazing, no signs of ischemic disease.  The  patient complains of recurrent episodes of shortness of breath, tingling  across her chest, diffuse weakness, headache, at times confusion.  Today she  had a brief period of weakness of her left hand where she was unable to move  it.  She thinks that it was seconds to no more than a few minutes.  There  were no other associated focal signs of weakness, and she had total and  complete resolution of her symptoms.  She was evaluated in the emergency  room.  I was asked to see her and admitted her for evaluation of a right  brain TIA.   Risk factors for stroke include atrial fibrillation, hypertension, and a  smoking history.  She quit 15 years ago and has 25-30 pack years of smoking.   Comorbid conditions of gastritis, vitamin B12 deficiency, COPD, GI  dysmotility, osteoporosis, prior peptic ulcer disease with perforation.   PAST SURGICAL HISTORY:  Billroth procedure for a perforated stomach.  Vein  stripping.   REVIEW OF SYSTEMS:  Patient has had recurrent episodes of complaints in the  history of present illness over the past several days.  She has had chronic  headaches of unknown cause.  She has not had fever or sinus infection, but  she is on ciprofloxacin.  I do not know why.  The remainder of her 12-system  review is negative.   CURRENT MEDICATIONS:  1. Coumadin 2.5 mg Sunday, Monday, Wednesday, Friday; 5 mg Tuesday,      Thursday,  Saturday.  2. Diltiazem 120 mg daily.  3. Nexium 40 mg daily.  4. Os-Cal 600 mg once daily.  5. Metoclopramide 5 mg three times daily.  6. Advair discus, dose unknown.  7. Fosamax 75 mg weekly.  8. Ciprofloxacin 250 mg daily.  9. Imdur 30 mg daily.   DRUG ALLERGIES:  SULFA (hives).  The chart says that she is also allergic to  DEMEROL and AMPICILLIN, but she denies this.   FAMILY HISTORY:  Mother died of cerebrovascular accident at age 2.  Father  died of myocardial infarction at age 2.  The patient has a maternal great  aunt who had a stroke which disabled her, who ultimately died of  complications.  She has a sister who died of a remote cancer of unknown  source and a brother who died of cancer of the throat.  A sister died of  cerebral hemorrhage.  One died of Alzheimer's.  One died of unknown  etiology.  Her children are alive and well.  SOCIAL HISTORY:  Smoking, as noted above.  No alcohol use.  She lives at  [facility] in an apartment in assisted living.  Her son-in-law is at  bedside.   PHYSICAL EXAMINATION:  VITAL SIGNS:  Temperature 97.5, blood pressure  128/77, resting pulse 55, respirations 20, oxygen saturation 96%.  NECK:  No bruits.  Supple.  LUNGS:  Clear.  HEART:  No murmurs.  Pulses normal.  ABDOMEN:  Soft.  Bowel sounds normal.  No hepatosplenomegaly.  EXTREMITIES:  Unremarkable.  NEUROLOGIC:  Patient was awake and alert.  Cranial nerves, round, reactive  pupils.  Fundi normal.  Visual fields full.  Extraocular movements are full.  Symmetric facial strength, midline tongue and uvula.  Air conduction greater  than bone conduction bilaterally.  Motor examination: Normal strength, tone,  and mass.  Good fine motor movements.  No pronator drift.  Sensation:  Mild  peripheral neuropathy.  Good stereoagnosis.  No hemisensory.  Cerebellar  examination:  Good finger to nose, heel-knee-shin.  Gait not tested.  Deep  tendon reflexes diminished except the  biceps, which were normal.  Patient  had bilateral flexor plantar responses.   IMPRESSION:  1. Transient ischemic attack, right brain, 435.8.  Patient is on      therapeutic Coumadin.  2. Atrial fibrillation, hypertension, remote smoking.   PLAN:  MRI of the brain without contrast.  MRA intracranial, 2D  echocardiogram, carotid Doppler.  Will obtain labs to look at serum lipids,  diabetes mellitus, and homocysteine.  No change in her medications at this  time.  We hope to discharge her home in 1-2 days after her workup is  complete if there is no surgical intervention indicated.  I appreciate the  opportunity to participate in her care.  I decided not to add aspirin to her  regimen because she is therapeutic, and we do not know yet why she had her  TIA event.  At present, I see no reason to request consultation with her  primary doctor or his hospitalist colleagues or her cardiologist or  gastroenterologist.  Will not hesitate to do so if she has complications.      Deanna Artis. Sharene Skeans, M.D.  Electronically Signed     [facility]/MEDQ  D:  01/24/2006  T:  01/24/2006  Job:  [id number]   cc:   Soyla Murphy. Renne Crigler, M.D.  Lyn Records, M.D.

## 2010-09-29 NOTE — Op Note (Signed)
Leslie, [name]             ACCOUNT NO.:  [id number]   MEDICAL RECORD NO.:  [id number]          PATIENT TYPE:  AMB   LOCATION:  ENDO                         FACILITY:  [facility]   PHYSICIAN:  Georgiana Spinner, M.D.    DATE OF BIRTH:  December 20, 1915   DATE OF PROCEDURE:  12/11/2005  DATE OF DISCHARGE:                                 OPERATIVE REPORT   PROCEDURE:  Upper endoscopy.   INDICATIONS:  Abdominal pain, questionable abnormality on biopsy previously.   ANESTHESIA:  Fentanyl 25 mcg, Versed 3 mg.   PROCEDURE:  With the patient mildly sedated in the left lateral decubitus  position, the Olympus videoscopic endoscope was inserted in the mouth and  advanced under direct vision through the esophagus, which appeared normal  into the stomach and there was some gastroparesis with some food seen in the  stomach and bowel.  We advanced to the anastomotic site and placed the  endoscope in retroflexion and viewed the stomach from below and there was  lymphoid tissue and changes probably consistent with gastritis in the  fundus.  These were photographed and multiple biopsies taken from various  regions.  Once obtained, the endoscope was then straightened and withdrawn.  The patient's vital signs, pulse oximeter remained stable.  The patient  tolerated procedure well without apparent complications.   FINDINGS:  Changes of gastritis in the stomach with gastroparesis.  Await  biopsy report.  The patient will call me for results and follow up with me  as an outpatient.           ______________________________  Georgiana Spinner, M.D.     GMO/MEDQ  D:  12/11/2005  T:  12/11/2005  Job:  [id number]

## 2010-10-19 ENCOUNTER — Other Ambulatory Visit: Payer: Self-pay | Admitting: Dermatology

## 2010-11-25 ENCOUNTER — Emergency Department (HOSPITAL_BASED_OUTPATIENT_CLINIC_OR_DEPARTMENT_OTHER)
Admission: EM | Admit: 2010-11-25 | Discharge: 2010-11-26 | Disposition: A | Discharge status: Skilled Nursing Facility | Payer: Medicare Other | Attending: Emergency Medicine | Admitting: Emergency Medicine

## 2010-11-25 ENCOUNTER — Emergency Department (HOSPITAL_COMMUNITY): Payer: Medicare Other

## 2010-11-25 ENCOUNTER — Emergency Department (INDEPENDENT_AMBULATORY_CARE_PROVIDER_SITE_OTHER): Payer: Medicare Other

## 2010-11-25 ENCOUNTER — Encounter: Payer: Self-pay | Admitting: *Deleted

## 2010-11-25 DIAGNOSIS — I4891 Unspecified atrial fibrillation: Secondary | ICD-10-CM | POA: Insufficient documentation

## 2010-11-25 DIAGNOSIS — W010XXA Fall on same level from slipping, tripping and stumbling without subsequent striking against object, initial encounter: Secondary | ICD-10-CM | POA: Insufficient documentation

## 2010-11-25 DIAGNOSIS — R1031 Right lower quadrant pain: Secondary | ICD-10-CM | POA: Insufficient documentation

## 2010-11-25 DIAGNOSIS — Z79899 Other long term (current) drug therapy: Secondary | ICD-10-CM | POA: Insufficient documentation

## 2010-11-25 DIAGNOSIS — S0990XA Unspecified injury of head, initial encounter: Secondary | ICD-10-CM | POA: Insufficient documentation

## 2010-11-25 DIAGNOSIS — W19XXXA Unspecified fall, initial encounter: Secondary | ICD-10-CM

## 2010-11-25 DIAGNOSIS — M25559 Pain in unspecified hip: Secondary | ICD-10-CM | POA: Insufficient documentation

## 2010-11-25 HISTORY — DX: Transient cerebral ischemic attack, unspecified: G45.9

## 2010-11-25 HISTORY — DX: Essential (primary) hypertension: I10

## 2010-11-25 HISTORY — DX: Unspecified atrial fibrillation: I48.91

## 2010-11-25 HISTORY — DX: Hyperlipidemia, unspecified: E78.5

## 2010-11-25 HISTORY — DX: Gastritis, unspecified, without bleeding: K29.70

## 2010-11-25 HISTORY — DX: Peptic ulcer, site unspecified, unspecified as acute or chronic, without hemorrhage or perforation: K27.9

## 2010-11-25 HISTORY — DX: Other specified disorders of bone density and structure, unspecified site: M85.80

## 2010-11-25 HISTORY — DX: Deficiency of other specified B group vitamins: E53.8

## 2010-11-25 LAB — CBC
HCT: 38.9 % (ref 36.0–46.0)
Hemoglobin: 13 g/dL (ref 12.0–15.0)
MCH: 31.3 pg (ref 26.0–34.0)
MCHC: 33.4 g/dL (ref 30.0–36.0)
MCV: 93.7 fL (ref 78.0–100.0)
RBC: 4.15 MIL/uL (ref 3.87–5.11)

## 2010-11-25 LAB — URINALYSIS, ROUTINE W REFLEX MICROSCOPIC
Bilirubin Urine: NEGATIVE
Glucose, UA: NEGATIVE mg/dL
Hgb urine dipstick: NEGATIVE
Ketones, ur: NEGATIVE mg/dL
Nitrite: NEGATIVE
Specific Gravity, Urine: 1.013 (ref 1.005–1.030)
pH: 5.5 (ref 5.0–8.0)

## 2010-11-25 LAB — DIFFERENTIAL
Eosinophils Absolute: 0.2 10*3/uL (ref 0.0–0.7)
Lymphs Abs: 1.9 10*3/uL (ref 0.7–4.0)
Monocytes Absolute: 0.7 10*3/uL (ref 0.1–1.0)
Monocytes Relative: 10 % (ref 3–12)
Neutrophils Relative %: 60 % (ref 43–77)

## 2010-11-25 LAB — BASIC METABOLIC PANEL
BUN: 24 mg/dL — ABNORMAL HIGH (ref 6–23)
CO2: 26 mEq/L (ref 19–32)
Chloride: 105 mEq/L (ref 96–112)
Creatinine, Ser: 0.7 mg/dL (ref 0.50–1.10)
Glucose, Bld: 51 mg/dL — ABNORMAL LOW (ref 70–99)
Potassium: 4 mEq/L (ref 3.5–5.1)

## 2010-11-25 MED ORDER — SODIUM CHLORIDE 0.9 % IV SOLN: Freq: Once | INTRAVENOUS | Status: DC

## 2010-11-25 MED ORDER — MORPHINE SULFATE 2 MG/ML IJ SOLN
2.0000 mg | Freq: Once | INTRAMUSCULAR | Status: AC
  Administered 2010-11-25: 2 mg via INTRAVENOUS
  Filled 2010-11-25: qty 1

## 2010-11-25 NOTE — ED Provider Notes (Signed)
History     Chief Complaint  Patient presents with  . Fall   HPI Patient fell tonight striking her occiput. Presently complains of right lower quadrant pain and right hip pain as a result of the fall. EMS reports she was ambulatory with a walker after her fall. Patient did not suffer loss of consciousness. SHe felt well prior to the fall. EMS treated patient with immobilization on long board with a hard collar and CID No past medical history on file. Past medical history atrial fibrillation peptic ulcer disease No past surgical history on file. Surgical history right hip surgery laparotomy for perforated ulcer No family history on file.  History  Substance Use Topics  . Smoking status: Not on file  . Smokeless tobacco: Not on file  . Alcohol Use: Not on file   Social history no tobacco no alcohol OB History    No data available      Review of Systems  Constitutional: Negative.   HENT: Negative.   Respiratory: Negative.   Cardiovascular: Negative.   Gastrointestinal: Positive for abdominal pain.  Musculoskeletal: Positive for arthralgias.       Right hip pain after fall  Skin: Negative.   Neurological: Negative.   Hematological: Negative.   Psychiatric/Behavioral: Negative.     Physical Exam  BP 151/87  Pulse 77  Temp(Src) 97.9 F (36.6 C) (Oral)  Resp 20  SpO2 96%  Physical Exam  Constitutional: She is oriented to person, place, and time. She appears well-developed and well-nourished. No distress.  HENT:  Head: Normocephalic and atraumatic.  Eyes: Conjunctivae are normal. Pupils are equal, round, and reactive to light.  Neck: Neck supple. No tracheal deviation present. No thyromegaly present.       No midline tenderness  Cardiovascular: Normal rate and regular rhythm.   No murmur heard. Pulmonary/Chest: Effort normal and breath sounds normal.  Abdominal: Soft. Bowel sounds are normal. She exhibits no distension. There is no tenderness.  Musculoskeletal: She  exhibits no edema and no tenderness.       No deformity. Pain at right hip on internal rotation of thigh  Neurological: She is alert and oriented to person, place, and time. Coordination normal.  Skin: Skin is warm and dry. No rash noted.  Psychiatric: She has a normal mood and affect.    ED Course  Procedures  MDM Patient signed out to Dr. Bernette Mayers at 12 midnight      Doug Sou, MD 11/26/10 0004

## 2010-11-26 DIAGNOSIS — M542 Cervicalgia: Secondary | ICD-10-CM

## 2010-11-26 DIAGNOSIS — M5030 Other cervical disc degeneration, unspecified cervical region: Secondary | ICD-10-CM

## 2010-11-26 DIAGNOSIS — K7689 Other specified diseases of liver: Secondary | ICD-10-CM

## 2010-11-26 DIAGNOSIS — R1011 Right upper quadrant pain: Secondary | ICD-10-CM

## 2010-11-26 DIAGNOSIS — M5020 Other cervical disc displacement, unspecified cervical region: Secondary | ICD-10-CM

## 2010-11-26 DIAGNOSIS — G319 Degenerative disease of nervous system, unspecified: Secondary | ICD-10-CM

## 2010-11-26 DIAGNOSIS — IMO0002 Reserved for concepts with insufficient information to code with codable children: Secondary | ICD-10-CM

## 2010-11-26 DIAGNOSIS — Q762 Congenital spondylolisthesis: Secondary | ICD-10-CM

## 2010-11-26 DIAGNOSIS — W19XXXA Unspecified fall, initial encounter: Secondary | ICD-10-CM

## 2010-11-26 MED ORDER — IOHEXOL 300 MG/ML  SOLN
100.0000 mL | Freq: Once | INTRAMUSCULAR | Status: AC | PRN
  Administered 2010-11-26: 100 mL via INTRAVENOUS

## 2010-11-26 NOTE — ED Provider Notes (Signed)
  Physical Exam  BP 151/87  Pulse 76  Temp(Src) 98.6 F (37 C) (Oral)  Resp 20  SpO2 97%  Physical Exam  Resting comfortably.    ED Course  Procedures  MDM Imaging and labs unremarkable. Pt ready to go home. Family will transport. She has pain medications on her MAR at Assisted Living facility.       Charles B. Bernette Mayers, MD 11/26/10 [id number]

## 2010-11-26 NOTE — ED Notes (Signed)
Taken to Assisted Living Facility per [employer].

## 2010-12-01 ENCOUNTER — Other Ambulatory Visit (HOSPITAL_BASED_OUTPATIENT_CLINIC_OR_DEPARTMENT_OTHER): Payer: Medicare Other

## 2011-02-01 LAB — URINALYSIS, ROUTINE W REFLEX MICROSCOPIC
Glucose, UA: NEGATIVE
Ketones, ur: NEGATIVE
pH: 5.5

## 2011-02-01 LAB — BASIC METABOLIC PANEL
BUN: 17
Creatinine, Ser: 0.68
GFR calc Af Amer: >60
GFR calc non Af Amer: >60
Potassium: 4.3

## 2011-02-01 LAB — URINE MICROSCOPIC-ADD ON

## 2011-02-01 LAB — CBC
Platelets: 184
WBC: 7.5

## 2011-02-01 LAB — DIFFERENTIAL
Lymphocytes Relative: 22
Lymphs Abs: 1.6
Neutrophils Relative %: 66

## 2011-02-02 LAB — I-STAT 8, (EC8 V) (CONVERTED LAB)
Acid-base deficit: 1
BUN: 16
Chloride: 107
Hemoglobin: 13.6
Hemoglobin: 13.3
Operator id: 284251
Potassium: 4.6
Sodium: 140
TCO2: 26
pCO2, Ven: 37.5 — ABNORMAL LOW
pH, Ven: 7.416 — ABNORMAL HIGH

## 2011-02-02 LAB — CK TOTAL AND CKMB (NOT AT ARMC)
Relative Index: INVALID
Relative Index: INVALID
Total CK: 29
Total CK: 37

## 2011-02-02 LAB — COMPREHENSIVE METABOLIC PANEL
Albumin: 3.3 — ABNORMAL LOW
Alkaline Phosphatase: 50
Alkaline Phosphatase: 50
BUN: 16
BUN: 11
Calcium: 9.4
Chloride: 109
Glucose, Bld: 102 — ABNORMAL HIGH
Potassium: 4.1
Potassium: 4
Total Bilirubin: 0.7
Total Protein: 5.9 — ABNORMAL LOW

## 2011-02-02 LAB — CARDIAC PANEL(CRET KIN+CKTOT+MB+TROPI)
CK, MB: 1.4
CK, MB: 1.4
Relative Index: INVALID
Total CK: 42

## 2011-02-02 LAB — PROTIME-INR
INR: 1.6 — ABNORMAL HIGH
INR: 1.6 — ABNORMAL HIGH
INR: 1.5
INR: 1.8 — ABNORMAL HIGH
INR: 2.5 — ABNORMAL HIGH
INR: 2.9 — ABNORMAL HIGH
Prothrombin Time: 19.2 — ABNORMAL HIGH
Prothrombin Time: 16.7 — ABNORMAL HIGH
Prothrombin Time: 18.3 — ABNORMAL HIGH
Prothrombin Time: 21.4 — ABNORMAL HIGH
Prothrombin Time: 31.3 — ABNORMAL HIGH

## 2011-02-02 LAB — BASIC METABOLIC PANEL
BUN: 13
CO2: 27
Chloride: 107
Creatinine, Ser: 0.78
GFR calc Af Amer: >60
Potassium: 4.2

## 2011-02-02 LAB — LIPID PANEL
Cholesterol: 174
HDL: 93
LDL Cholesterol: 70
Triglycerides: 54

## 2011-02-02 LAB — CBC
HCT: 34.7 — ABNORMAL LOW
MCHC: 33.6
MCV: 87.5
Platelets: 175
RBC: 3.96
RDW: 14.5
WBC: 5.3
WBC: 6.8

## 2011-02-02 LAB — POCT I-STAT CREATININE
Creatinine, Ser: 1.1
Operator id: 284251

## 2011-02-02 LAB — POCT CARDIAC MARKERS
CKMB, poc: <1 — ABNORMAL LOW
CKMB, poc: <1 — ABNORMAL LOW
Myoglobin, poc: 36.6
Troponin i, poc: <0.05

## 2011-02-02 LAB — TROPONIN I: Troponin I: 0.02

## 2011-02-02 LAB — APTT: aPTT: 34

## 2011-02-12 LAB — URINE CULTURE
Colony Count: NO GROWTH
Culture: NO GROWTH

## 2011-02-12 LAB — DIFFERENTIAL
Basophils Relative: 0
Eosinophils Relative: 0
Monocytes Absolute: 0.3
Monocytes Relative: 5
Neutro Abs: 6.2

## 2011-02-12 LAB — BASIC METABOLIC PANEL
CO2: 32
Calcium: 8.8
Chloride: 105
GFR calc Af Amer: >60
GFR calc Af Amer: >60
GFR calc non Af Amer: >60
GFR calc non Af Amer: >60
Glucose, Bld: 106 — ABNORMAL HIGH
Potassium: 4.4
Potassium: 4.5
Sodium: 141
Sodium: 141
Sodium: 142

## 2011-02-12 LAB — COMPREHENSIVE METABOLIC PANEL
ALT: 23
AST: 30
Albumin: 3.7
Alkaline Phosphatase: 52
BUN: 16
Chloride: 109
GFR calc Af Amer: >60
Potassium: 3.8
Sodium: 145
Total Protein: 6.4

## 2011-02-12 LAB — CBC
HCT: 34.4 — ABNORMAL LOW
HCT: 34.7 — ABNORMAL LOW
Hemoglobin: 10.3 — ABNORMAL LOW
Hemoglobin: 11 — ABNORMAL LOW
MCV: 80.9
Platelets: 203
RBC: 3.96
RBC: 4.29
RDW: 16.9 — ABNORMAL HIGH
RDW: 17 — ABNORMAL HIGH
WBC: 7
WBC: 7.1

## 2011-02-12 LAB — URINALYSIS, ROUTINE W REFLEX MICROSCOPIC
Bilirubin Urine: NEGATIVE
Glucose, UA: NEGATIVE
Hgb urine dipstick: NEGATIVE
Specific Gravity, Urine: 1.007

## 2011-02-12 LAB — LIPID PANEL
HDL: 81
LDL Cholesterol: 39
Triglycerides: 55
VLDL: 11

## 2011-02-12 LAB — PROTIME-INR
INR: 3.1 — ABNORMAL HIGH
Prothrombin Time: 26.4 — ABNORMAL HIGH

## 2011-02-12 LAB — EXPECTORATED SPUTUM ASSESSMENT W GRAM STAIN, RFLX TO RESP C

## 2011-02-12 LAB — APTT: aPTT: 39 — ABNORMAL HIGH

## 2011-02-22 LAB — CBC
HCT: 35.6 — ABNORMAL LOW
Hemoglobin: 12.2
MCHC: 34.3
MCV: 88.4
Platelets: 195
RBC: 4.02
RDW: 13.1
WBC: 4.3

## 2011-02-22 LAB — PROTIME-INR
INR: 1.9 — ABNORMAL HIGH
Prothrombin Time: 22.2 — ABNORMAL HIGH

## 2011-02-22 LAB — BASIC METABOLIC PANEL
CO2: 27
Chloride: 108
Creatinine, Ser: 0.77
GFR calc Af Amer: >60
Potassium: 4.1

## 2011-02-22 LAB — DIFFERENTIAL
Basophils Absolute: 0
Basophils Relative: 1
Eosinophils Absolute: 0.3
Eosinophils Relative: 6 — ABNORMAL HIGH
Lymphocytes Relative: 30
Lymphs Abs: 1.3
Monocytes Absolute: 0.4
Monocytes Relative: 10
Neutro Abs: 2.2
Neutrophils Relative %: 53

## 2011-02-22 LAB — BASIC METABOLIC PANEL WITH GFR
BUN: 17
Calcium: 9.2
GFR calc non Af Amer: >60
Glucose, Bld: 97
Sodium: 142

## 2011-02-22 LAB — POCT CARDIAC MARKERS
CKMB, poc: <1 — ABNORMAL LOW
Myoglobin, poc: 77.5
Operator id: 4531
Troponin i, poc: <0.05

## 2011-02-26 LAB — PROTIME-INR
INR: 1.2
INR: 1.5
INR: 2.3 — ABNORMAL HIGH
Prothrombin Time: 15.9 — ABNORMAL HIGH
Prothrombin Time: 15.9 — ABNORMAL HIGH
Prothrombin Time: 16.3 — ABNORMAL HIGH
Prothrombin Time: 18.9 — ABNORMAL HIGH
Prothrombin Time: 26.2 — ABNORMAL HIGH
Prothrombin Time: 29 — ABNORMAL HIGH
Prothrombin Time: 30.4 — ABNORMAL HIGH

## 2011-02-26 LAB — BASIC METABOLIC PANEL
BUN: 10
BUN: 11
Calcium: 9.9
Creatinine, Ser: 0.68
Creatinine, Ser: 0.74
GFR calc non Af Amer: >60
GFR calc non Af Amer: >60
Glucose, Bld: 94
Glucose, Bld: 96
Potassium: 3.6
Potassium: 3.9

## 2011-02-26 LAB — COMPREHENSIVE METABOLIC PANEL
ALT: 17
AST: 23
Alkaline Phosphatase: 54
CO2: 26
GFR calc non Af Amer: >60
Glucose, Bld: 89
Potassium: 4.4
Sodium: 140
Total Protein: 6

## 2011-02-26 LAB — CARDIAC PANEL(CRET KIN+CKTOT+MB+TROPI)
CK, MB: 1.5
Relative Index: INVALID
Relative Index: INVALID
Total CK: 48
Troponin I: 0.01
Troponin I: 0.02

## 2011-02-26 LAB — HEPARIN LEVEL (UNFRACTIONATED): Heparin Unfractionated: <0.1 — ABNORMAL LOW

## 2011-02-26 LAB — CBC
HCT: 36.2
HCT: 41.6
MCHC: 33.8
MCHC: 34
MCV: 91.5
MCV: 92.1
MCV: 92.2
Platelets: 166
Platelets: 169
Platelets: 185
RBC: 3.71 — ABNORMAL LOW
RDW: 12.1
RDW: 12.3
RDW: 12.3
WBC: 3.9 — ABNORMAL LOW
WBC: 4.9
WBC: 5.4

## 2011-02-26 LAB — B-NATRIURETIC PEPTIDE (CONVERTED LAB): Pro B Natriuretic peptide (BNP): 218 — ABNORMAL HIGH

## 2011-02-28 LAB — BASIC METABOLIC PANEL
BUN: 10
BUN: 10
CO2: 23
CO2: 26
CO2: 28
Calcium: 8.7
Calcium: 9
Calcium: 9.7
Chloride: 107
Chloride: 107
Creatinine, Ser: 0.62
Creatinine, Ser: 0.73
Creatinine, Ser: 0.77
GFR calc Af Amer: >60
GFR calc Af Amer: >60
GFR calc non Af Amer: >60
Glucose, Bld: 103 — ABNORMAL HIGH
Glucose, Bld: 106 — ABNORMAL HIGH
Glucose, Bld: 114 — ABNORMAL HIGH

## 2011-02-28 LAB — CBC
HCT: 36.2
HCT: 36.5
Hemoglobin: 12.2
Hemoglobin: 12.2
MCHC: 33.7
MCHC: 33.8
MCHC: 33.8
MCHC: 33.9
MCV: 92
MCV: 92.7
MCV: 93.1
MCV: 94.8
Platelets: 154
Platelets: 157
Platelets: 160
Platelets: 164
RBC: 3.69 — ABNORMAL LOW
RBC: 3.91
RBC: 3.99
RDW: 12.3
RDW: 12.6
WBC: 5
WBC: 5.2
WBC: 5.3

## 2011-02-28 LAB — DIFFERENTIAL
Basophils Relative: 0
Eosinophils Absolute: 0.1
Monocytes Absolute: 0.4
Monocytes Relative: 8
Neutrophils Relative %: 61

## 2011-02-28 LAB — HEPARIN LEVEL (UNFRACTIONATED)
Heparin Unfractionated: 0.21 — ABNORMAL LOW
Heparin Unfractionated: 0.35
Heparin Unfractionated: 0.8 — ABNORMAL HIGH

## 2011-02-28 LAB — B-NATRIURETIC PEPTIDE (CONVERTED LAB): Pro B Natriuretic peptide (BNP): 115 — ABNORMAL HIGH

## 2011-02-28 LAB — PROTIME-INR: Prothrombin Time: 17.9 — ABNORMAL HIGH

## 2011-03-01 LAB — PROTIME-INR
INR: 2 — ABNORMAL HIGH
INR: 2 — ABNORMAL HIGH
INR: 2.3 — ABNORMAL HIGH
Prothrombin Time: 23.6 — ABNORMAL HIGH
Prothrombin Time: 23.8 — ABNORMAL HIGH
Prothrombin Time: 26.1 — ABNORMAL HIGH

## 2011-03-01 LAB — CK TOTAL AND CKMB (NOT AT ARMC)
CK, MB: 1.8
Relative Index: INVALID
Total CK: 73

## 2011-03-01 LAB — DIFFERENTIAL
Basophils Absolute: 0
Basophils Relative: 1
Lymphocytes Relative: 39
Monocytes Absolute: 0.4
Neutro Abs: 2.4
Neutrophils Relative %: 46

## 2011-03-01 LAB — HEPARIN LEVEL (UNFRACTIONATED): Heparin Unfractionated: 0.2 — ABNORMAL LOW

## 2011-03-01 LAB — CBC
HCT: 40.8
Hemoglobin: 13.7
MCHC: 33.6
MCHC: 33.6
MCHC: 33.8
MCV: 93.6
Platelets: 180
RBC: 4.33
RDW: 12
RDW: 12.6
WBC: 5

## 2011-03-01 LAB — POCT CARDIAC MARKERS
CKMB, poc: <1 — ABNORMAL LOW
Operator id: 146091
Operator id: 234111
Troponin i, poc: <0.05
Troponin i, poc: <0.05

## 2011-03-01 LAB — I-STAT 8, (EC8 V) (CONVERTED LAB)
BUN: 13
Chloride: 107
Glucose, Bld: 109 — ABNORMAL HIGH
Potassium: 4.3
pCO2, Ven: 49.4
pH, Ven: 7.351 — ABNORMAL HIGH

## 2011-03-01 LAB — COMPREHENSIVE METABOLIC PANEL
ALT: 19
Albumin: 3.6
Alkaline Phosphatase: 43
Calcium: 9
Glucose, Bld: 107 — ABNORMAL HIGH
Potassium: 4.3
Sodium: 137
Total Protein: 6.3

## 2011-03-01 LAB — POCT I-STAT CREATININE
Creatinine, Ser: 1
Operator id: 146091

## 2011-03-01 LAB — LIPID PANEL
HDL: 62
Triglycerides: 62
VLDL: 12

## 2011-03-01 LAB — CARDIAC PANEL(CRET KIN+CKTOT+MB+TROPI)
Total CK: 51
Troponin I: 0.05

## 2011-03-01 LAB — APTT: aPTT: 39 — ABNORMAL HIGH

## 2011-03-01 LAB — TROPONIN I: Troponin I: 0.05

## 2011-03-16 ENCOUNTER — Other Ambulatory Visit: Payer: Self-pay | Admitting: Dermatology

## 2011-05-21 ENCOUNTER — Other Ambulatory Visit: Payer: Self-pay

## 2011-05-21 ENCOUNTER — Encounter (HOSPITAL_COMMUNITY): Payer: Self-pay | Admitting: *Deleted

## 2011-05-21 ENCOUNTER — Emergency Department (HOSPITAL_COMMUNITY): Payer: Medicare Other

## 2011-05-21 ENCOUNTER — Observation Stay (HOSPITAL_COMMUNITY)
Admission: EM | Admit: 2011-05-21 | Discharge: 2011-05-23 | Payer: Medicare Other | Attending: Internal Medicine | Admitting: Internal Medicine

## 2011-05-21 DIAGNOSIS — R0602 Shortness of breath: Secondary | ICD-10-CM

## 2011-05-21 DIAGNOSIS — I2510 Atherosclerotic heart disease of native coronary artery without angina pectoris: Secondary | ICD-10-CM | POA: Diagnosis present

## 2011-05-21 DIAGNOSIS — S22009A Unspecified fracture of unspecified thoracic vertebra, initial encounter for closed fracture: Secondary | ICD-10-CM | POA: Insufficient documentation

## 2011-05-21 DIAGNOSIS — R0989 Other specified symptoms and signs involving the circulatory and respiratory systems: Secondary | ICD-10-CM | POA: Insufficient documentation

## 2011-05-21 DIAGNOSIS — X58XXXA Exposure to other specified factors, initial encounter: Secondary | ICD-10-CM | POA: Insufficient documentation

## 2011-05-21 DIAGNOSIS — I4891 Unspecified atrial fibrillation: Secondary | ICD-10-CM | POA: Diagnosis present

## 2011-05-21 DIAGNOSIS — R5381 Other malaise: Secondary | ICD-10-CM | POA: Insufficient documentation

## 2011-05-21 DIAGNOSIS — E538 Deficiency of other specified B group vitamins: Secondary | ICD-10-CM | POA: Insufficient documentation

## 2011-05-21 DIAGNOSIS — R0609 Other forms of dyspnea: Secondary | ICD-10-CM | POA: Insufficient documentation

## 2011-05-21 DIAGNOSIS — R059 Cough, unspecified: Secondary | ICD-10-CM

## 2011-05-21 DIAGNOSIS — I080 Rheumatic disorders of both mitral and aortic valves: Secondary | ICD-10-CM | POA: Insufficient documentation

## 2011-05-21 DIAGNOSIS — Z79899 Other long term (current) drug therapy: Secondary | ICD-10-CM | POA: Insufficient documentation

## 2011-05-21 DIAGNOSIS — E785 Hyperlipidemia, unspecified: Secondary | ICD-10-CM | POA: Insufficient documentation

## 2011-05-21 DIAGNOSIS — Z7901 Long term (current) use of anticoagulants: Secondary | ICD-10-CM | POA: Insufficient documentation

## 2011-05-21 DIAGNOSIS — R0600 Dyspnea, unspecified: Secondary | ICD-10-CM

## 2011-05-21 DIAGNOSIS — I10 Essential (primary) hypertension: Secondary | ICD-10-CM | POA: Insufficient documentation

## 2011-05-21 DIAGNOSIS — Z8673 Personal history of transient ischemic attack (TIA), and cerebral infarction without residual deficits: Secondary | ICD-10-CM | POA: Insufficient documentation

## 2011-05-21 DIAGNOSIS — R079 Chest pain, unspecified: Secondary | ICD-10-CM | POA: Diagnosis present

## 2011-05-21 DIAGNOSIS — S2239XA Fracture of one rib, unspecified side, initial encounter for closed fracture: Secondary | ICD-10-CM | POA: Diagnosis present

## 2011-05-21 DIAGNOSIS — R0789 Other chest pain: Principal | ICD-10-CM | POA: Insufficient documentation

## 2011-05-21 DIAGNOSIS — S22000A Wedge compression fracture of unspecified thoracic vertebra, initial encounter for closed fracture: Secondary | ICD-10-CM | POA: Diagnosis present

## 2011-05-21 HISTORY — DX: Unspecified macular degeneration: H35.30

## 2011-05-21 HISTORY — DX: Atherosclerotic heart disease of native coronary artery without angina pectoris: I25.10

## 2011-05-21 HISTORY — DX: Cough, unspecified: R05.9

## 2011-05-21 HISTORY — DX: Sick sinus syndrome: I49.5

## 2011-05-21 HISTORY — DX: Edema, unspecified: R60.9

## 2011-05-21 HISTORY — DX: Hypoglycemia, unspecified: E16.2

## 2011-05-21 HISTORY — DX: Cough: R05

## 2011-05-21 HISTORY — DX: Unspecified osteoarthritis, unspecified site: M19.90

## 2011-05-21 LAB — CARDIAC PANEL(CRET KIN+CKTOT+MB+TROPI)
CK, MB: 2.2 ng/mL (ref 0.3–4.0)
CK, MB: 1.9 ng/mL (ref 0.3–4.0)
Total CK: 45 U/L (ref 7–177)

## 2011-05-21 LAB — CBC
MCH: 30.9 pg (ref 26.0–34.0)
MCHC: 32.6 g/dL (ref 30.0–36.0)
MCV: 95 fL (ref 78.0–100.0)
Platelets: 232 10*3/uL (ref 150–400)
RDW: 12.6 % (ref 11.5–15.5)

## 2011-05-21 LAB — URINALYSIS, ROUTINE W REFLEX MICROSCOPIC
Glucose, UA: NEGATIVE mg/dL
Leukocytes, UA: NEGATIVE
Nitrite: NEGATIVE
Specific Gravity, Urine: 1.021 (ref 1.005–1.030)
pH: 5.5 (ref 5.0–8.0)

## 2011-05-21 LAB — DIFFERENTIAL
Basophils Absolute: 0 10*3/uL (ref 0.0–0.1)
Basophils Relative: 0 % (ref 0–1)
Eosinophils Absolute: 0.2 10*3/uL (ref 0.0–0.7)
Eosinophils Relative: 3 % (ref 0–5)
Lymphs Abs: 2 10*3/uL (ref 0.7–4.0)
Neutrophils Relative %: 60 % (ref 43–77)

## 2011-05-21 LAB — BASIC METABOLIC PANEL
Calcium: 9.6 mg/dL (ref 8.4–10.5)
GFR calc Af Amer: 88 mL/min — ABNORMAL LOW (ref >90)
GFR calc non Af Amer: 76 mL/min — ABNORMAL LOW (ref >90)
Glucose, Bld: 116 mg/dL — ABNORMAL HIGH (ref 70–99)
Sodium: 140 mEq/L (ref 135–145)

## 2011-05-21 LAB — PROTIME-INR: Prothrombin Time: 25.7 seconds — ABNORMAL HIGH (ref 11.6–15.2)

## 2011-05-21 MED ORDER — NITROGLYCERIN 0.4 MG SL SUBL
0.4000 mg | SUBLINGUAL_TABLET | SUBLINGUAL | Status: DC | PRN
  Administered 2011-05-21: 0.4 mg via SUBLINGUAL
  Filled 2011-05-21: qty 25

## 2011-05-21 MED ORDER — POLYETHYLENE GLYCOL 3350 17 G PO PACK
17.0000 g | PACK | Freq: Every day | ORAL | Status: DC | PRN
  Filled 2011-05-21: qty 1

## 2011-05-21 MED ORDER — FLUTICASONE-SALMETEROL 100-50 MCG/DOSE IN AEPB
1.0000 | INHALATION_SPRAY | Freq: Two times a day (BID) | RESPIRATORY_TRACT | Status: DC
  Administered 2011-05-21 – 2011-05-23 (×3): 1 via RESPIRATORY_TRACT
  Filled 2011-05-21: qty 14

## 2011-05-21 MED ORDER — POLYETHYLENE GLYCOL 3350 17 GM/SCOOP PO POWD: 17.0000 g | Freq: Every day | ORAL | Status: DC | PRN

## 2011-05-21 MED ORDER — PANTOPRAZOLE SODIUM 40 MG PO TBEC
40.0000 mg | DELAYED_RELEASE_TABLET | Freq: Every day | ORAL | Status: DC
  Administered 2011-05-21 – 2011-05-23 (×3): 40 mg via ORAL
  Filled 2011-05-21 (×3): qty 1

## 2011-05-21 MED ORDER — METOPROLOL SUCCINATE ER 100 MG PO TB24
150.0000 mg | ORAL_TABLET | Freq: Every day | ORAL | Status: DC
  Administered 2011-05-21 – 2011-05-23 (×3): 150 mg via ORAL
  Filled 2011-05-21 (×3): qty 1

## 2011-05-21 MED ORDER — ALBUTEROL SULFATE HFA 108 (90 BASE) MCG/ACT IN AERS: 2.0000 | INHALATION_SPRAY | RESPIRATORY_TRACT | Status: DC | PRN

## 2011-05-21 MED ORDER — ASPIRIN 81 MG PO CHEW
324.0000 mg | CHEWABLE_TABLET | Freq: Once | ORAL | Status: AC
  Administered 2011-05-21: 324 mg via ORAL
  Filled 2011-05-21: qty 4

## 2011-05-21 MED ORDER — WARFARIN SODIUM 5 MG PO TABS: 5.0000 mg | ORAL_TABLET | ORAL | Status: DC

## 2011-05-21 MED ORDER — WARFARIN SODIUM 2.5 MG PO TABS
2.5000 mg | ORAL_TABLET | ORAL | Status: DC
  Filled 2011-05-21: qty 1

## 2011-05-21 MED ORDER — SIMVASTATIN 20 MG PO TABS
20.0000 mg | ORAL_TABLET | Freq: Every day | ORAL | Status: DC
  Administered 2011-05-22: 20 mg via ORAL
  Filled 2011-05-21 (×2): qty 1

## 2011-05-21 MED ORDER — WARFARIN SODIUM 2.5 MG PO TABS
2.5000 mg | ORAL_TABLET | ORAL | Status: AC
  Administered 2011-05-21: 2.5 mg via ORAL
  Filled 2011-05-21: qty 1

## 2011-05-21 NOTE — ED Notes (Signed)
Reports chest pressure and SOB that began this am, pain radites to left side neck and into jaw.

## 2011-05-21 NOTE — ED Notes (Signed)
Attempted to call report. Floor RN unable to take report at this time. Will call back in 5-10 min

## 2011-05-21 NOTE — Progress Notes (Signed)
ANTICOAGULATION CONSULT NOTE - Initial Consult  Pharmacy Consult for Coumadin Indication: atrial fibrillation  Allergies  Allergen Reactions  . Ampicillin   . Ciprofloxin Hcl (Ciprofloxacin Hcl)   . Demerol   . Sulfa Antibiotics Rash      Vital Signs: Temp: 97.8 F (36.6 C) (01/07 1928) Temp src: Oral (01/07 1928) BP: 146/62 mmHg (01/07 1928) Pulse Rate: 63  (01/07 1928)  Labs:  Basename 05/21/11 1907 05/21/11 1545  HGB -- 11.2*  HCT -- 34.4*  PLT -- 232  APTT -- --  LABPROT -- 25.7*  INR -- 2.30*  HEPARINUNFRC -- --  CREATININE -- 0.60  CKTOTAL 45 46  CKMB 1.9 2.2  TROPONINI <0.30 <0.30     Medical History: Past Medical History  Diagnosis Date  . Transient ischemic attack (TIA)   . Atrial fibrillation   . Hypertension   . Dyslipidemia   . Gastritis   . Vitamin B12 deficiency   . Peptic ulcer disease   . Osteopenia   . Macular degeneration, right eye   . Arthritis     back, shoulders  . Cough 05/21/2011    pt states cough x 2 days  . Edema     left leg  . Hypoglycemia   . Urinary incontinence   . CAD 06/15/2009  . Sinoatrial node dysfunction 06/15/2009    Medications:  Prescriptions prior to admission  Medication Sig Dispense Refill  . atorvastatin (LIPITOR) 10 MG tablet Take 10 mg by mouth every morning.        . benzocaine-menthol (CHLORAEPTIC) 6-10 MG lozenge Take 1 lozenge by mouth every 2 (two) hours as needed. For sore throat        . calcium carbonate (OS-CAL) 600 MG TABS Take 600 mg by mouth 2 (two) times daily.        . Cholecalciferol (VITAMIN D3) 1000 UNITS CAPS Take 1 capsule by mouth 2 (two) times daily.        Marland Kitchen docusate (COLACE) 50 MG/5ML liquid Place 50 mg into both ears as directed. Instill 3-5 drops into both ears at bedtime every Saturday & Sunday once a month       . Fluticasone-Salmeterol (ADVAIR DISKUS) 100-50 MCG/DOSE AEPB Inhale 1 puff into the lungs 2 (two) times daily. Rinse mouth after use.       Marland Kitchen HYDROcodone-acetaminophen  (NORCO) 10-325 MG per tablet Take 1 tablet by mouth 3 (three) times daily as needed. For pain.       . metoprolol (TOPROL-XL) 100 MG 24 hr tablet Take 150 mg by mouth daily.        . Multiple Vitamin (MULITIVITAMIN WITH MINERALS) TABS Take 1 tablet by mouth daily.        Marland Kitchen omeprazole (PRILOSEC) 20 MG capsule Take 20 mg by mouth 2 (two) times daily.        . traMADol (ULTRAM-ER) 100 MG 24 hr tablet Take 100 mg by mouth daily.        . vitamin B-12 (CYANOCOBALAMIN) 1000 MCG tablet Place 1,000 mcg under the tongue daily.        Marland Kitchen acetaminophen (TYLENOL) 500 MG tablet Take 1,000 mg by mouth 3 (three) times daily as needed. For pain not to exceed 3 gm per day       . albuterol (VENTOLIN HFA) 108 (90 BASE) MCG/ACT inhaler Inhale 2 puffs into the lungs every 4 (four) hours as needed. For shortness of breath. Wait 1 minute between each puff.      Marland Kitchen  Multiple Vitamins-Minerals (ICAPS AREDS FORMULA PO) Take 1 tablet by mouth 2 (two) times daily.        . nitroGLYCERIN (NITROSTAT) 0.4 MG SL tablet Place 0.4 mg under the tongue every 5 (five) minutes as needed. For chest pain up to 3 doses       . Omega-3 Fatty Acids (SEA-OMEGA 30 PO) Take 1 capsule by mouth 2 (two) times daily.        . polyethylene glycol powder (GLYCOLAX/MIRALAX) powder Take 17 g by mouth daily as needed.        . warfarin (COUMADIN) 2.5 MG tablet Take 2.5 mg by mouth daily. On Monday, Wednesday, and Friday       . warfarin (COUMADIN) 5 MG tablet Take 5 mg by mouth daily. On Tuesday, Thursday, Saturday, and Sunday        Scheduled:    . aspirin  324 mg Oral Once  . Fluticasone-Salmeterol  1 puff Inhalation BID  . metoprolol  150 mg Oral Daily  . pantoprazole  40 mg Oral Q1200  . simvastatin  20 mg Oral QPC supper  . warfarin  2.5 mg Oral Custom  . warfarin  5 mg Oral Custom   Infusions:   PRN: albuterol, nitroGLYCERIN, polyethylene glycol, DISCONTD: polyethylene glycol powder  Assessment: 78 yo SNF resident, Coumadin PTA for hx  Afib. Dose PTA = 2.5mg  MWF, 5mg  other days, last dose PTA = 05/20/11. INR is within therapeutic range. No bleeding reported.   Goal of Therapy:  INR 2-3   Plan:  Coumadin 2.5mg  tonight as per home regimen. F/U am INR.   Gwen Her PharmD  [phone] 05/21/2011 10:17 PM

## 2011-05-21 NOTE — ED Notes (Signed)
Pt from [facility]. Son visited patient yesterday and noticed a cough and SOB. Son wanted pt to be seen by an MD if it didn't get better. Health care aid noticed pt having increased work to breathe and called EMS. Pt's vitals: O2 sats 96% on room air, 60 HR, 110 SBP per EMS on arrival. Pt has an atrial pacer. EMS reports no evidence of coughing or SOB while transporting patient to the ER. Pt is a DNR. Pt alert and oriented x 3, neuro intact. Bilateral breath sounds CTA per EMS.

## 2011-05-21 NOTE — Consult Note (Signed)
Admit date: 05/21/2011 Name: [name] DOB:  08-18-1915 MRN:  [id number]  Referring Physician:    Dr. Hyman Hopes  Primary Physician:    Dr. Merri Brunette Primary Cardiologist:    Dr. Verdis Prime  Reason for Consultation:    Shortness of breath and chest pain  HPI:    This 78 year old female has a history of coronary artery disease with stenting of the right coronary artery in 2008 and the LAD also that year. Because of in-stent restenosis she had 2 Cypher stents placed in the LAD in 2009. She currently lives in assisted-living at friend's home and is relatively inactive. She developed a cough over the past couple of days and developed dyspnea at yesterday that worsened today and was described as being breathless when she would talk. She had significant conversational dyspnea and also had some twinges of chest pain and had some intermittent chest pressure. She came to the emergency room earlier today and had a mildly elevated BNP. She was advised admitted to the hospital and is not currently having any chest discomfort. She has had some intermittent diaphoresis but no nausea or vomiting. She has significant chest discomfort in her upper back and states that her pain has been constant. She normally has no PND or orthopnea. Troponins have been negative since admission.    Past Medical History  Diagnosis Date  . Transient ischemic attack (TIA)   . Atrial fibrillation   . Hypertension   . Dyslipidemia   . Gastritis   . Vitamin B12 deficiency   . Peptic ulcer disease   . Osteopenia   . Macular degeneration, right eye   . Arthritis     back, shoulders  . Cough 05/21/2011    pt states cough x 2 days  . Edema     left leg  . Hypoglycemia   . Urinary incontinence   . CAD 06/15/2009  . Sinoatrial node dysfunction 06/15/2009      Past Surgical History  Procedure Date  . Perforated ulcer   . Eye surgery     bilateral  . Pacemaker insertion   . Hip surgery     broke hip and has pin in it  .  Drug-eluting stent 2008,[id number]    2009 to LAD  . Cardiac catheterization    Allergies:  is allergic to ampicillin; ciprofloxin hcl; demerol; and sulfa antibiotics.   Medications: Prior to Admission medications   Medication Sig Start Date End Date Taking? Authorizing Provider  atorvastatin (LIPITOR) 10 MG tablet Take 10 mg by mouth every morning.     Yes Historical Provider, MD  benzocaine-menthol (CHLORAEPTIC) 6-10 MG lozenge Take 1 lozenge by mouth every 2 (two) hours as needed. For sore throat     Yes Historical Provider, MD  calcium carbonate (OS-CAL) 600 MG TABS Take 600 mg by mouth 2 (two) times daily.     Yes Historical Provider, MD  Cholecalciferol (VITAMIN D3) 1000 UNITS CAPS Take 1 capsule by mouth 2 (two) times daily.     Yes Historical Provider, MD  docusate (COLACE) 50 MG/5ML liquid Place 50 mg into both ears as directed. Instill 3-5 drops into both ears at bedtime every Saturday & Sunday once a month    Yes Historical Provider, MD  Fluticasone-Salmeterol (ADVAIR DISKUS) 100-50 MCG/DOSE AEPB Inhale 1 puff into the lungs 2 (two) times daily. Rinse mouth after use.    Yes Historical Provider, MD  HYDROcodone-acetaminophen (NORCO) 10-325 MG per tablet Take 1 tablet by mouth 3 (three)  times daily as needed. For pain.    Yes Historical Provider, MD  metoprolol (TOPROL-XL) 100 MG 24 hr tablet Take 150 mg by mouth daily.     Yes Historical Provider, MD  Multiple Vitamin (MULITIVITAMIN WITH MINERALS) TABS Take 1 tablet by mouth daily.     Yes Historical Provider, MD  omeprazole (PRILOSEC) 20 MG capsule Take 20 mg by mouth 2 (two) times daily.     Yes Historical Provider, MD  traMADol (ULTRAM-ER) 100 MG 24 hr tablet Take 100 mg by mouth daily.     Yes Historical Provider, MD  vitamin B-12 (CYANOCOBALAMIN) 1000 MCG tablet Place 1,000 mcg under the tongue daily.     Yes Historical Provider, MD  acetaminophen (TYLENOL) 500 MG tablet Take 1,000 mg by mouth 3 (three) times daily as needed. For pain  not to exceed 3 gm per day     Historical Provider, MD  albuterol (VENTOLIN HFA) 108 (90 BASE) MCG/ACT inhaler Inhale 2 puffs into the lungs every 4 (four) hours as needed. For shortness of breath. Wait 1 minute between each puff.    Historical Provider, MD  Multiple Vitamins-Minerals (ICAPS AREDS FORMULA PO) Take 1 tablet by mouth 2 (two) times daily.      Historical Provider, MD  nitroGLYCERIN (NITROSTAT) 0.4 MG SL tablet Place 0.4 mg under the tongue every 5 (five) minutes as needed. For chest pain up to 3 doses     Historical Provider, MD  Omega-3 Fatty Acids (SEA-OMEGA 30 PO) Take 1 capsule by mouth 2 (two) times daily.      Historical Provider, MD  polyethylene glycol powder (GLYCOLAX/MIRALAX) powder Take 17 g by mouth daily as needed.      Historical Provider, MD  warfarin (COUMADIN) 2.5 MG tablet Take 2.5 mg by mouth daily. On Monday, Wednesday, and Friday     Historical Provider, MD  warfarin (COUMADIN) 5 MG tablet Take 5 mg by mouth daily. On Tuesday, Thursday, Saturday, and Sunday     Historical Provider, MD    Family History:  Father died age 50 of a cart infarction. Mother died age 27 of stroke. 2 sisters died of cancer.  Social History:   reports that she has never smoked. She has never used smokeless tobacco. She reports that she does not drink alcohol or use illicit drugs. widow, lives at friend's home  Review of Systems: .Negative except previous history of TIAs. She has significant urinary incontinence and frequency. She is currently being treated for a urinary tract infection. She has severe low back pain and severe generalized arthritis.  Other than as noted above the remainder did give systems is unremarkable.   Physical Exam: Blood pressure 146/74, pulse 59, temperature 97.6 F (36.4 C), temperature source Oral, resp. rate 18, SpO2 98.00%.    General appearance: alert, appears stated age and no distress Head: Normocephalic, without obvious abnormality,  atraumatic Eyes: conjunctivae/corneas clear. PERRL, EOM's intact. Fundi benign. Nose: Nares normal. Septum midline. Mucosa normal. No drainage or sinus tenderness. Neck: no adenopathy, no carotid bruit, no JVD and supple, symmetrical, trachea midline Lungs: clear to auscultation bilaterally Heart: regular rate and rhythm, S1, S2 normal, no murmur, click, rub or gallop Abdomen: soft, non-tender; bowel sounds normal; no masses,  no organomegaly Extremities: There is 2+ edema of the left leg with some mild erythema. There bilateral saphenous vein harvesting scars noted. Pulses: 2+ and symmetric Skin: Skin color, texture, turgor normal. No rashes or lesions Neurologic: Grossly normal   Labs: Results  for orders placed during the hospital encounter of 05/21/11 (from the past 24 hour(s))  PRO B NATRIURETIC PEPTIDE     Status: Abnormal   Collection Time   05/21/11  3:20 PM      Component Value Range   Pro B Natriuretic peptide (BNP) 1058.0 (*) 0 - 450 (pg/mL)  CBC     Status: Abnormal   Collection Time   05/21/11  3:45 PM      Component Value Range   WBC 6.6  4.0 - 10.5 (K/uL)   RBC 3.62 (*) 3.87 - 5.11 (MIL/uL)   Hemoglobin 11.2 (*) 12.0 - 15.0 (g/dL)   HCT 16.1 (*) 09.6 - 46.0 (%)   MCV 95.0  78.0 - 100.0 (fL)   MCH 30.9  26.0 - 34.0 (pg)   MCHC 32.6  30.0 - 36.0 (g/dL)   RDW 04.5  40.9 - 81.1 (%)   Platelets 232  150 - 400 (K/uL)  DIFFERENTIAL     Status: Normal   Collection Time   05/21/11  3:45 PM      Component Value Range   Neutrophils Relative 60  43 - 77 (%)   Neutro Abs 4.0  1.7 - 7.7 (K/uL)   Lymphocytes Relative 30  12 - 46 (%)   Lymphs Abs 2.0  0.7 - 4.0 (K/uL)   Monocytes Relative 8  3 - 12 (%)   Monocytes Absolute 0.5  0.1 - 1.0 (K/uL)   Eosinophils Relative 3  0 - 5 (%)   Eosinophils Absolute 0.2  0.0 - 0.7 (K/uL)   Basophils Relative 0  0 - 1 (%)   Basophils Absolute 0.0  0.0 - 0.1 (K/uL)  BASIC METABOLIC PANEL     Status: Abnormal   Collection Time   05/21/11  3:45  PM      Component Value Range   Sodium 140  135 - 145 (mEq/L)   Potassium 3.9  3.5 - 5.1 (mEq/L)   Chloride 106  96 - 112 (mEq/L)   CO2 23  19 - 32 (mEq/L)   Glucose, Bld 116 (*) 70 - 99 (mg/dL)   BUN 19  6 - 23 (mg/dL)   Creatinine, Ser 9.14  0.50 - 1.10 (mg/dL)   Calcium 9.6  8.4 - 78.2 (mg/dL)   GFR calc non Af Amer 76 (*) >90 (mL/min)   GFR calc Af Amer 88 (*) >90 (mL/min)  CARDIAC PANEL(CRET KIN+CKTOT+MB+TROPI)     Status: Normal   Collection Time   05/21/11  3:45 PM      Component Value Range   Total CK 46  7 - 177 (U/L)   CK, MB 2.2  0.3 - 4.0 (ng/mL)   Troponin I <0.30  <0.30 (ng/mL)   Relative Index RELATIVE INDEX IS INVALID  0.0 - 2.5   PROTIME-INR     Status: Abnormal   Collection Time   05/21/11  3:45 PM      Component Value Range   Prothrombin Time 25.7 (*) 11.6 - 15.2 (seconds)   INR 2.30 (*) 0.00 - 1.49   URINALYSIS, ROUTINE W REFLEX MICROSCOPIC     Status: Normal   Collection Time   05/21/11  4:04 PM      Component Value Range   Color, Urine YELLOW  YELLOW    APPearance CLEAR  CLEAR    Specific Gravity, Urine 1.021  1.005 - 1.030    pH 5.5  5.0 - 8.0    Glucose, UA NEGATIVE  NEGATIVE (mg/dL)  Hgb urine dipstick NEGATIVE  NEGATIVE    Bilirubin Urine NEGATIVE  NEGATIVE    Ketones, ur NEGATIVE  NEGATIVE (mg/dL)   Protein, ur NEGATIVE  NEGATIVE (mg/dL)   Urobilinogen, UA 1.0  0.0 - 1.0 (mg/dL)   Nitrite NEGATIVE  NEGATIVE    Leukocytes, UA NEGATIVE  NEGATIVE       Radiology: Possible acute or subacute rib fracture  EKG: Paced rhythm  IMPRESSIONS: 1. Dyspnea of uncertain cause. Her BNP is only minimally elevated and I doubt this is heart failure 2. Chest discomfort somewhat atypical in a patient with known coronary artery disease 3. Paroxysmal atrial fibrillation 4. History of sick sinus syndrome with permanent pacemaker 5. Coronary artery disease with previous drug-eluting stent to the LAD and bare-metal stent of the right coronary artery and diagonal 6.  Hyperlipidemia under treatment 7. Limited functional status 8. History of cough 9. Possible rib fracture  RECOMMENDATION: She will have serial cardiac enzymes. She is currently fully anticoagulated. She may have had a rib fracture that could account for some of her chest pain. Would obtain a 2-D echocardiogram and await the results of the chemistry testing. I would imagine that she would be treated conservatively unless she develops worsening pain her pain is difficult to control with medical therapy.   Signed:  Darden Palmer MD [facility]   Cardiology Consultant 05/21/2011, 7:21 PM

## 2011-05-21 NOTE — ED Provider Notes (Signed)
History     CSN: [id number]  Arrival date & time 05/21/11  1454   First MD Initiated Contact with Patient 05/21/11 1504      Chief Complaint  Patient presents with  . Shortness of Breath    (Consider location/radiation/quality/duration/timing/severity/associated sxs/prior treatment) HPI  89yoF h/o CAD status post stenting x5 pw sob. X2 days. Patient states it is worse with exertion but she is also having shortness of breath at conversational dyspnea. She states that the conversational dyspnea Dr. Marton Redwood better today. She's complaining of intermittent chest discomfort which she rates as a 2/10 it has been present for several days. She states it's been constant since she woke up this morning. She's also having some discomfort in her upper back. There is no radiation to her neck, arms. She states she is intermittently diaphoretic. Denies nausea, vomiting. Denies fevers, chills. She complains of productive cough for 2 days. Denies abdominal pain. She does complain of urinary urgency and states that she was recently diagnosed with a urinary tract infection and is scheduled to have her first dose of antibiotics tonight at her assisted-living facility. She complains of diffuse weakness. Denies numbness, tingling, weakness of her extremities otherwise.  Denies h/o VTE in self or family. No recent hosp/surg/immob. No h/o cancer. Denies exogenous hormone use, chronic LLE swelling x years  and in 2008 with systolic dysfunction EF 50% at that time  Pt states she can't remember if she's experienced similar chest pressure. "Relieved immensely" with ntg given pta   ED Notes, ED Provider Notes from 05/21/11 0000 to 05/21/11 14:49:13       Darla Lesches, RN 05/21/2011 14:44      Pt from [facility]. Son visited patient yesterday and noticed a cough and SOB. Son wanted pt to be seen by an MD if it didn't get better. Health care aid noticed pt having increased work to breathe and called EMS. Pt's vitals: O2  sats 96% on room air, 60 HR, 110 SBP per EMS on arrival. Pt has an atrial pacer. EMS reports no evidence of coughing or SOB while transporting patient to the ER. Pt is a DNR. Pt alert and oriented x 3, neuro intact. Bilateral breath sounds CTA per EMS.      Past Medical History  Diagnosis Date  . Transient ischemic attack (TIA)   . Atrial fibrillation   . Hypertension   . Dyslipidemia   . Gastritis   . Vitamin B12 deficiency   . Peptic ulcer disease   . Osteopenia     History reviewed. No pertinent past surgical history.  History reviewed. No pertinent family history.  History  Substance Use Topics  . Smoking status: Never Smoker   . Smokeless tobacco: Not on file  . Alcohol Use: No    OB History    Grav Para Term Preterm Abortions TAB SAB Ect Mult Living                  Review of Systems  All other systems reviewed and are negative.  except as noted HPI  Allergies  Ampicillin; Ciprofloxin hcl; Demerol; and Sulfa antibiotics  Home Medications   Current Outpatient Rx  Name Route Sig Dispense Refill  . ATORVASTATIN CALCIUM 10 MG PO TABS Oral Take 10 mg by mouth every morning.      Marland Kitchen BENZOCAINE-MENTHOL 6-10 MG MT LOZG Oral Take 1 lozenge by mouth every 2 (two) hours as needed. For sore throat      .  CALCIUM CARBONATE 600 MG PO TABS Oral Take 600 mg by mouth 2 (two) times daily.      Marland Kitchen VITAMIN D3 1000 UNITS PO CAPS Oral Take 1 capsule by mouth 2 (two) times daily.      Marland Kitchen DOCUSATE SODIUM 50 MG/5ML PO LIQD Both Ears Place 50 mg into both ears as directed. Instill 3-5 drops into both ears at bedtime every Saturday & Sunday once a month     . FLUTICASONE-SALMETEROL 100-50 MCG/DOSE IN AEPB Inhalation Inhale 1 puff into the lungs 2 (two) times daily. Rinse mouth after use.     Marland Kitchen HYDROCODONE-ACETAMINOPHEN 10-325 MG PO TABS Oral Take 1 tablet by mouth 3 (three) times daily as needed. For pain.     Marland Kitchen METOPROLOL SUCCINATE ER 100 MG PO TB24 Oral Take 150 mg by mouth daily.        . ADULT MULTIVITAMIN W/MINERALS CH Oral Take 1 tablet by mouth daily.      Marland Kitchen OMEPRAZOLE 20 MG PO CPDR Oral Take 20 mg by mouth 2 (two) times daily.      . TRAMADOL HCL ER 100 MG PO TB24 Oral Take 100 mg by mouth daily.      Marland Kitchen VITAMIN B-12 1000 MCG PO TABS Sublingual Place 1,000 mcg under the tongue daily.      . ACETAMINOPHEN 500 MG PO TABS Oral Take 1,000 mg by mouth 3 (three) times daily as needed. For pain not to exceed 3 gm per day     . ALBUTEROL SULFATE HFA 108 (90 BASE) MCG/ACT IN AERS Inhalation Inhale 2 puffs into the lungs every 4 (four) hours as needed. For shortness of breath. Wait 1 minute between each puff.    . ICAPS AREDS FORMULA PO Oral Take 1 tablet by mouth 2 (two) times daily.      Marland Kitchen NITROGLYCERIN 0.4 MG SL SUBL Sublingual Place 0.4 mg under the tongue every 5 (five) minutes as needed. For chest pain up to 3 doses     . SEA-OMEGA 30 PO Oral Take 1 capsule by mouth 2 (two) times daily.      Marland Kitchen POLYETHYLENE GLYCOL 3350 PO POWD Oral Take 17 g by mouth daily as needed.      . WARFARIN SODIUM 2.5 MG PO TABS Oral Take 2.5 mg by mouth daily. On Monday, Wednesday, and Friday     . WARFARIN SODIUM 5 MG PO TABS Oral Take 5 mg by mouth daily. On Tuesday, Thursday, Saturday, and Sunday       BP 146/74  Pulse 59  Temp(Src) 97.6 F (36.4 C) (Oral)  Resp 18  SpO2 98%  Physical Exam  Nursing note and vitals reviewed. Constitutional: She is oriented to person, place, and time. She appears well-developed.  HENT:  Head: Atraumatic.  Mouth/Throat: Oropharynx is clear and moist.  Eyes: Conjunctivae and EOM are normal. Pupils are equal, round, and reactive to light.  Neck: Normal range of motion. Neck supple.  Cardiovascular: Normal rate, regular rhythm, normal heart sounds and intact distal pulses.   Pulmonary/Chest: Effort normal and breath sounds normal. No respiratory distress. She has no wheezes. She has no rales.  Abdominal: Soft. She exhibits no distension. There is no  tenderness. There is no rebound and no guarding.  Musculoskeletal: Normal range of motion. She exhibits edema.       LLE with 3+ pitting edema  RLE with trace pitting edema  Neurological: She is alert and oriented to person, place, and time.  Skin: Skin  is warm and dry. No rash noted.  Psychiatric: She has a normal mood and affect.     Date: 05/21/2011  Rate: 60  Rhythm: normal sinus rhythm  QRS Axis: normal  Intervals: nl  ST/T Wave abnormalities: nonspecific ST changes  Conduction Disutrbances:first-degree A-V block   Narrative Interpretation:   Old EKG Reviewed: unchanged  ED Course  Procedures (including critical care time)  Labs Reviewed  CBC - Abnormal; Notable for the following:    RBC 3.62 (*)    Hemoglobin 11.2 (*)    HCT 34.4 (*)    All other components within normal limits  BASIC METABOLIC PANEL - Abnormal; Notable for the following:    Glucose, Bld 116 (*)    GFR calc non Af Amer 76 (*)    GFR calc Af Amer 88 (*)    All other components within normal limits  PRO B NATRIURETIC PEPTIDE - Abnormal; Notable for the following:    Pro B Natriuretic peptide (BNP) 1058.0 (*)    All other components within normal limits  PROTIME-INR - Abnormal; Notable for the following:    Prothrombin Time 25.7 (*)    INR 2.30 (*)    All other components within normal limits  DIFFERENTIAL  CARDIAC PANEL(CRET KIN+CKTOT+MB+TROPI)  URINALYSIS, ROUTINE W REFLEX MICROSCOPIC   Dg Chest 2 View  05/21/2011  *RADIOLOGY REPORT*  Clinical Data: Coughing, shortness of breath, and weakness  CHEST - 2 VIEW  Comparison: CT chest 07/20/2010 and chest radiograph 07/20/2010  Findings: Stable cardiomegaly and mediastinal contours. Coronary artery stent noted.  Left subclavian dual lead pacer device with leads terminating in the right atrium and right ventricle is stable.  Pulmonary vascularity is within normal limits.  The lungs are normally expanded and clear.  Negative for edema, focal airspace  opacity, effusion, or pneumothorax.  The bones are osteopenic.  There is a stable compression fracture of  the mid thoracic spine vertebral body (either T6 or T7), compared to prior chest CT. Degenerative changes of both shoulders.  There is irregularity and offset of the lateral arc of the left fifth rib, suspicious for an acute or subacute fracture.  IMPRESSION:  1.  Stable cardiomegaly and dual lead pacemaker.  No acute cardiopulmonary disease identified. 2.  Question acute or subacute left fifth rib fracture. 3.  Chronic single level mid thoracic spine compression fracture.  Per CMS PQRS reporting requirements (PQRS Measure 24): Given the patient's age of greater than 50 and the fracture site (hip, distal radius, or spine), the patient should be tested for osteoporosis using DXA, and the appropriate treatment considered based on the DXA results.  Original Report Authenticated By: Britta Mccreedy, M.D.     1. Shortness of breath   2. Chest pain     MDM  Presents with SOB unclear etiology. Chest pressure.  DDx incl Pna, CHF, ACS, less likely dissection, PE. EKG, CXR, labs, ntg, asa, reassess  Labs reviewed and unremarkable. BNP slightly elevated from previous. No overt pulm edema on CXR. Patient feeling slightly better after NTG and asa given here. Chest pressure 1/10. D/W Dr. Viann Fish, cardiology. Will see as Research scientist (medical). Admit to hospitalist. D/W [facility]. Discussed disp and plan for further w/u and evalution with patient and family.           Forbes Cellar, MD 05/21/11 1821

## 2011-05-21 NOTE — H&P (Signed)
History and Physical Examination  Date: 05/21/2011  Patient name: [name] Medical record number: [id number] Date of birth: 07-04-1915 Age: 78 y.o. Gender: female PCP: Dr. Merri Brunette Cardiologist: Dr. Verdis Prime  Attending physician: Hillery Aldo  Historians: Patient and ER physician  Chief Complaint:  Chief Complaint  Patient presents with  . Shortness of Breath     History of Present Illness: [name] is an 78 y.o. female with a known h/o CAD status post stenting x5 presented to the ER with SOB and chest pressure that improved with NTG.  Symptoms have persisted for days. Patient states it is worse with exertion but she is also having shortness of breath at conversational dyspnea. She states that the conversational dyspnea has been somewhat better today. She's currently reporting intermittent chest discomfort which she rates as a 2/10. She states it's been constant since she woke up this morning. She's also having some discomfort in her upper back. There is radiation to left side of neck and left arm.   She states she is intermittently diaphoretic. Denies nausea, vomiting. Denies fevers, chills. She complains of productive cough for 2 days. Denies abdominal pain. She does complain of urinary urgency and states that she was recently diagnosed with a urinary tract infection and is scheduled to have her first dose of antibiotics tonight at her assisted-living facility. She complains of diffuse weakness. Denies numbness, tingling, weakness of her extremities otherwise. Denies h/o VTE in self or family. No recent hospitalizations/surgery/immobility. No h/o cancer. Denies exogenous hormone use, chronic LLE swelling x years and in 2008 with systolic dysfunction EF 50% at that time.    Past Medical History Past Medical History  Diagnosis Date  . Transient ischemic attack (TIA)   . Atrial fibrillation   . Hypertension   . Dyslipidemia   . Gastritis   . Vitamin B12  deficiency   . Peptic ulcer disease   . Osteopenia        CAD s/p stenting x 5  Past Surgical History History reviewed. No pertinent past surgical history.  Home Meds: Prior to Admission medications   Medication Sig Start Date End Date Taking? Authorizing Provider  atorvastatin (LIPITOR) 10 MG tablet Take 10 mg by mouth every morning.     Yes Historical Provider, MD  benzocaine-menthol (CHLORAEPTIC) 6-10 MG lozenge Take 1 lozenge by mouth every 2 (two) hours as needed. For sore throat     Yes Historical Provider, MD  calcium carbonate (OS-CAL) 600 MG TABS Take 600 mg by mouth 2 (two) times daily.     Yes Historical Provider, MD  Cholecalciferol (VITAMIN D3) 1000 UNITS CAPS Take 1 capsule by mouth 2 (two) times daily.     Yes Historical Provider, MD  docusate (COLACE) 50 MG/5ML liquid Place 50 mg into both ears as directed. Instill 3-5 drops into both ears at bedtime every Saturday & Sunday once a month    Yes Historical Provider, MD  Fluticasone-Salmeterol (ADVAIR DISKUS) 100-50 MCG/DOSE AEPB Inhale 1 puff into the lungs 2 (two) times daily. Rinse mouth after use.    Yes Historical Provider, MD  HYDROcodone-acetaminophen (NORCO) 10-325 MG per tablet Take 1 tablet by mouth 3 (three) times daily as needed. For pain.    Yes Historical Provider, MD  metoprolol (TOPROL-XL) 100 MG 24 hr tablet Take 150 mg by mouth daily.     Yes Historical Provider, MD  Multiple Vitamin (MULITIVITAMIN WITH MINERALS) TABS Take 1 tablet by mouth daily.  Yes Historical Provider, MD  omeprazole (PRILOSEC) 20 MG capsule Take 20 mg by mouth 2 (two) times daily.     Yes Historical Provider, MD  traMADol (ULTRAM-ER) 100 MG 24 hr tablet Take 100 mg by mouth daily.     Yes Historical Provider, MD  vitamin B-12 (CYANOCOBALAMIN) 1000 MCG tablet Place 1,000 mcg under the tongue daily.     Yes Historical Provider, MD  acetaminophen (TYLENOL) 500 MG tablet Take 1,000 mg by mouth 3 (three) times daily as needed. For pain not to  exceed 3 gm per day     Historical Provider, MD  albuterol (VENTOLIN HFA) 108 (90 BASE) MCG/ACT inhaler Inhale 2 puffs into the lungs every 4 (four) hours as needed. For shortness of breath. Wait 1 minute between each puff.    Historical Provider, MD  Multiple Vitamins-Minerals (ICAPS AREDS FORMULA PO) Take 1 tablet by mouth 2 (two) times daily.      Historical Provider, MD  nitroGLYCERIN (NITROSTAT) 0.4 MG SL tablet Place 0.4 mg under the tongue every 5 (five) minutes as needed. For chest pain up to 3 doses     Historical Provider, MD  Omega-3 Fatty Acids (SEA-OMEGA 30 PO) Take 1 capsule by mouth 2 (two) times daily.      Historical Provider, MD  polyethylene glycol powder (GLYCOLAX/MIRALAX) powder Take 17 g by mouth daily as needed.      Historical Provider, MD  warfarin (COUMADIN) 2.5 MG tablet Take 2.5 mg by mouth daily. On Monday, Wednesday, and Friday     Historical Provider, MD  warfarin (COUMADIN) 5 MG tablet Take 5 mg by mouth daily. On Tuesday, Thursday, Saturday, and Sunday     Historical Provider, MD    Allergies: Ampicillin; Ciprofloxin hcl; Demerol; and Sulfa antibiotics  Social History:  History   Social History  . Marital Status: Widowed    Spouse Name: N/A    Number of Children: N/A  . Years of Education: N/A   Occupational History  . Not on file.   Social History Main Topics  . Smoking status: Never Smoker   . Smokeless tobacco: Not on file  . Alcohol Use: No  . Drug Use: No  . Sexually Active: No   Other Topics Concern  . Not on file   Social History Narrative  . No narrative on file   Family History: History reviewed. No pertinent family history.  Review of Systems: Pertinent items are noted in HPI. All other systems reviewed and reported as negative.   Physical Exam: Blood pressure 146/74, pulse 59, temperature 97.6 F (36.4 C), temperature source Oral, resp. rate 18, SpO2 98.00%. Constitutional: She is oriented to person, place, and time. She  appears well-developed.  HENT: Head: Normocephalic Atraumatic.  Mouth/Throat: Oropharynx is clear and moist.  Eyes: Conjunctivae and EOM are normal. Pupils are equal, round, and reactive to light.  Neck: Normal range of motion. Neck supple. No JVD.  Cardiovascular: Normal rate, regular rhythm, normal s1,s2 sounds and intact distal pulses.  Pulmonary/Chest: Effort normal and breath sounds normal. No respiratory distress. She has no wheezes. She has no rales.  Abdominal: Soft. She exhibits no distension. There is no tenderness. There is no rebound and no guarding.  Musculoskeletal: Normal range of motion. She exhibits edema.  LLE with 3+ pitting edema, RLE with trace pitting edema  Neurological: She is alert and oriented to person, place, and time.  Skin: Skin is warm and dry. No rash noted.  Psychiatric: She has a  normal mood and affect.    Lab  And Imaging results:  Results for orders placed during the hospital encounter of 05/21/11 (from the past 24 hour(s))  PRO B NATRIURETIC PEPTIDE     Status: Abnormal   Collection Time   05/21/11  3:20 PM      Component Value Range   Pro B Natriuretic peptide (BNP) 1058.0 (*) 0 - 450 (pg/mL)  CBC     Status: Abnormal   Collection Time   05/21/11  3:45 PM      Component Value Range   WBC 6.6  4.0 - 10.5 (K/uL)   RBC 3.62 (*) 3.87 - 5.11 (MIL/uL)   Hemoglobin 11.2 (*) 12.0 - 15.0 (g/dL)   HCT 95.2 (*) 84.1 - 46.0 (%)   MCV 95.0  78.0 - 100.0 (fL)   MCH 30.9  26.0 - 34.0 (pg)   MCHC 32.6  30.0 - 36.0 (g/dL)   RDW 32.4  40.1 - 02.7 (%)   Platelets 232  150 - 400 (K/uL)  DIFFERENTIAL     Status: Normal   Collection Time   05/21/11  3:45 PM      Component Value Range   Neutrophils Relative 60  43 - 77 (%)   Neutro Abs 4.0  1.7 - 7.7 (K/uL)   Lymphocytes Relative 30  12 - 46 (%)   Lymphs Abs 2.0  0.7 - 4.0 (K/uL)   Monocytes Relative 8  3 - 12 (%)   Monocytes Absolute 0.5  0.1 - 1.0 (K/uL)   Eosinophils Relative 3  0 - 5 (%)   Eosinophils  Absolute 0.2  0.0 - 0.7 (K/uL)   Basophils Relative 0  0 - 1 (%)   Basophils Absolute 0.0  0.0 - 0.1 (K/uL)  BASIC METABOLIC PANEL     Status: Abnormal   Collection Time   05/21/11  3:45 PM      Component Value Range   Sodium 140  135 - 145 (mEq/L)   Potassium 3.9  3.5 - 5.1 (mEq/L)   Chloride 106  96 - 112 (mEq/L)   CO2 23  19 - 32 (mEq/L)   Glucose, Bld 116 (*) 70 - 99 (mg/dL)   BUN 19  6 - 23 (mg/dL)   Creatinine, Ser 2.53  0.50 - 1.10 (mg/dL)   Calcium 9.6  8.4 - 66.4 (mg/dL)   GFR calc non Af Amer 76 (*) >90 (mL/min)   GFR calc Af Amer 88 (*) >90 (mL/min)  CARDIAC PANEL(CRET KIN+CKTOT+MB+TROPI)     Status: Normal   Collection Time   05/21/11  3:45 PM      Component Value Range   Total CK 46  7 - 177 (U/L)   CK, MB 2.2  0.3 - 4.0 (ng/mL)   Troponin I <0.30  <0.30 (ng/mL)   Relative Index RELATIVE INDEX IS INVALID  0.0 - 2.5   PROTIME-INR     Status: Abnormal   Collection Time   05/21/11  3:45 PM      Component Value Range   Prothrombin Time 25.7 (*) 11.6 - 15.2 (seconds)   INR 2.30 (*) 0.00 - 1.49   URINALYSIS, ROUTINE W REFLEX MICROSCOPIC     Status: Normal   Collection Time   05/21/11  4:04 PM      Component Value Range   Color, Urine YELLOW  YELLOW    APPearance CLEAR  CLEAR    Specific Gravity, Urine 1.021  1.005 - 1.030    pH  5.5  5.0 - 8.0    Glucose, UA NEGATIVE  NEGATIVE (mg/dL)   Hgb urine dipstick NEGATIVE  NEGATIVE    Bilirubin Urine NEGATIVE  NEGATIVE    Ketones, ur NEGATIVE  NEGATIVE (mg/dL)   Protein, ur NEGATIVE  NEGATIVE (mg/dL)   Urobilinogen, UA 1.0  0.0 - 1.0 (mg/dL)   Nitrite NEGATIVE  NEGATIVE    Leukocytes, UA NEGATIVE  NEGATIVE    EKG Results:  Orders placed during the hospital encounter of 05/21/11  . ED EKG  . ED EKG     Impression  Principal Problem:  *Chest pain at rest Active Problems:  HYPERTENSION  CAD  Atrial fibrillation  Sinoatrial node dysfunction   Plan  Admit for observation, cycle cardiac enzymes to rule out MI, obtain  cardiology consult (ED physician has already spoken with cardiology team and they are aware of the patient).  NTG prn CP, Morphine prn pain, supplemental humidified O2.  Pt reporting that LLE edema is chronic finding. Please see orders.   Standley Dakins MD [facility] [place], [place] [phone] 05/21/2011, 6:55 PM

## 2011-05-22 ENCOUNTER — Other Ambulatory Visit: Payer: Self-pay

## 2011-05-22 DIAGNOSIS — S22000A Wedge compression fracture of unspecified thoracic vertebra, initial encounter for closed fracture: Secondary | ICD-10-CM | POA: Diagnosis present

## 2011-05-22 DIAGNOSIS — S2239XA Fracture of one rib, unspecified side, initial encounter for closed fracture: Secondary | ICD-10-CM | POA: Diagnosis present

## 2011-05-22 LAB — LIPID PANEL
HDL: 57 mg/dL (ref >39)
LDL Cholesterol: 50 mg/dL (ref 0–99)
Triglycerides: 84 mg/dL (ref <150)

## 2011-05-22 LAB — CARDIAC PANEL(CRET KIN+CKTOT+MB+TROPI)
Relative Index: INVALID (ref 0.0–2.5)
Relative Index: INVALID (ref 0.0–2.5)
Total CK: 45 U/L (ref 7–177)
Troponin I: <0.3 ng/mL (ref <0.30)

## 2011-05-22 LAB — PROTIME-INR
INR: 2.14 — ABNORMAL HIGH (ref 0.00–1.49)
Prothrombin Time: 24.3 seconds — ABNORMAL HIGH (ref 11.6–15.2)

## 2011-05-22 LAB — MRSA PCR SCREENING: MRSA by PCR: NEGATIVE

## 2011-05-22 MED ORDER — NITROGLYCERIN 0.2 MG/HR TD PT24
0.2000 mg | MEDICATED_PATCH | Freq: Every day | TRANSDERMAL | Status: DC
  Administered 2011-05-22 – 2011-05-23 (×2): 0.2 mg via TRANSDERMAL
  Filled 2011-05-22 (×2): qty 1

## 2011-05-22 MED ORDER — FUROSEMIDE 20 MG PO TABS
20.0000 mg | ORAL_TABLET | Freq: Every day | ORAL | Status: DC
  Administered 2011-05-22: 20 mg via ORAL
  Filled 2011-05-22 (×2): qty 1

## 2011-05-22 MED ORDER — WARFARIN SODIUM 5 MG PO TABS
5.0000 mg | ORAL_TABLET | Freq: Once | ORAL | Status: AC
  Administered 2011-05-22: 5 mg via ORAL
  Filled 2011-05-22: qty 1

## 2011-05-22 MED ORDER — DEXTROMETHORPHAN POLISTIREX 30 MG/5ML PO LQCR
30.0000 mg | Freq: Two times a day (BID) | ORAL | Status: DC
  Administered 2011-05-22 – 2011-05-23 (×2): 30 mg via ORAL
  Filled 2011-05-22 (×3): qty 5

## 2011-05-22 NOTE — Progress Notes (Signed)
Patient Name: [name] Date of Encounter: 05/22/2011    SUBJECTIVE: Complains that for the past 7 days she has had a cough and upper respiratory congestion. She feels as though she has had an upper respiratory infection. Her appetite is poor. Dyspnea has been progressive over the time frame. Yesterday there was also a pressure and heaviness in her chest. Cardiac markers to this point and negative for ischemia  TELEMETRY:  Paced rhythm: Filed Vitals:   05/21/11 1928 05/21/11 2145 05/21/11 2215 05/22/11 0545  BP: 146/62 148/72  107/61  Pulse: 63 60  60  Temp: 97.8 F (36.6 C) 97.4 F (36.3 C)  98 F (36.7 C)  TempSrc: Oral     Resp: 18 22 20 20   Height:  5\' 3"  (1.6 m)    Weight:  58.8 kg (129 lb 10.1 oz)    SpO2: 99% 94%  92%    Intake/Output Summary (Last 24 hours) at 05/22/11 0750 Last data filed at 05/22/11 0300  Gross per 24 hour  Intake    120 ml  Output    600 ml  Net   -480 ml    LABS: Basic Metabolic Panel:  Basename 05/21/11 1545  NA 140  K 3.9  CL 106  CO2 23  GLUCOSE 116*  BUN 19  CREATININE 0.60  CALCIUM 9.6  MG --  PHOS --   CBC:  Basename 05/21/11 1545  WBC 6.6  NEUTROABS 4.0  HGB 11.2*  HCT 34.4*  MCV 95.0  PLT 232   Cardiac Enzymes:  Basename 05/22/11 0320 05/21/11 1907 05/21/11 1545  CKTOTAL 43 45 46  CKMB 2.1 1.9 2.2  CKMBINDEX -- -- --  TROPONINI <0.30 <0.30 <0.30   BNP:1058  Radiology/Studies:  Chest X ray IMPRESSION:  1. Stable cardiomegaly and dual lead pacemaker. No acute  cardiopulmonary disease identified.  2. Question acute or subacute left fifth rib fracture.  3. Chronic single level mid thoracic spine compression fracture   Physical Exam: Blood pressure 107/61, pulse 60, temperature 98 F (36.7 C), temperature source Oral, resp. rate 20, height 5\' 3"  (1.6 m), weight 58.8 kg (129 lb 10.1 oz), SpO2 92.00%. Weight change:    No murmur or gallop. Rhonchi are heard throughout both lung fields. Neurologically  intact although memory is significantly impaired  ASSESSMENT:  1. Dyspnea uncertain cause. The elevated BNP corroborates the possibility of diastolic heart failure  2. Chest discomfort without evidence of myocardial infarction. I cannot totally exclude the possibility of angina. I ago however should be to suppress symptoms with medical therapy rather than invasive evaluation or ischemia he evaluation.  3. Persistent probable viral syndrome   Plan:  1. Agree with diuresis.  2. Start long-acting nitrate  3. Continue warfarin therapy.  Selinda Eon 05/22/2011, 7:50 AM

## 2011-05-22 NOTE — Progress Notes (Signed)
ANTICOAGULATION CONSULT NOTE - follow up  Pharmacy Consult for Coumadin Indication: atrial fibrillation  Allergies  Allergen Reactions  . Ampicillin   . Ciprofloxin Hcl (Ciprofloxacin Hcl)   . Demerol   . Sulfa Antibiotics Rash   Vital Signs: Temp: 98 F (36.7 C) (01/08 0545) BP: 137/80 mmHg (01/08 1010) Pulse Rate: 67  (01/08 1010)  Labs:  Basename 05/22/11 1046 05/22/11 0320 05/21/11 1907 05/21/11 1545  HGB -- -- -- 11.2*  HCT -- -- -- 34.4*  PLT -- -- -- 232  APTT -- -- -- --  LABPROT -- 24.3* -- 25.7*  INR -- 2.14* -- 2.30*  HEPARINUNFRC -- -- -- --  CREATININE -- -- -- 0.60  CKTOTAL 45 43 45 --  CKMB 1.9 2.1 1.9 --  TROPONINI <0.30 <0.30 <0.30 --     Medical History: Past Medical History  Diagnosis Date  . Transient ischemic attack (TIA)   . Atrial fibrillation   . Hypertension   . Dyslipidemia   . Gastritis   . Vitamin B12 deficiency   . Peptic ulcer disease   . Osteopenia   . Macular degeneration, right eye   . Arthritis     back, shoulders  . Cough 05/21/2011    pt states cough x 2 days  . Edema     left leg  . Hypoglycemia   . Urinary incontinence   . CAD 06/15/2009  . Sinoatrial node dysfunction 06/15/2009    Medications:  Prescriptions prior to admission  Medication Sig Dispense Refill  . atorvastatin (LIPITOR) 10 MG tablet Take 10 mg by mouth every morning.        . benzocaine-menthol (CHLORAEPTIC) 6-10 MG lozenge Take 1 lozenge by mouth every 2 (two) hours as needed. For sore throat        . calcium carbonate (OS-CAL) 600 MG TABS Take 600 mg by mouth 2 (two) times daily.        . Cholecalciferol (VITAMIN D3) 1000 UNITS CAPS Take 1 capsule by mouth 2 (two) times daily.        Marland Kitchen docusate (COLACE) 50 MG/5ML liquid Place 50 mg into both ears as directed. Instill 3-5 drops into both ears at bedtime every Saturday & Sunday once a month       . Fluticasone-Salmeterol (ADVAIR DISKUS) 100-50 MCG/DOSE AEPB Inhale 1 puff into the lungs 2 (two) times  daily. Rinse mouth after use.       Marland Kitchen HYDROcodone-acetaminophen (NORCO) 10-325 MG per tablet Take 1 tablet by mouth 3 (three) times daily as needed. For pain.       . metoprolol (TOPROL-XL) 100 MG 24 hr tablet Take 150 mg by mouth daily.        . Multiple Vitamin (MULITIVITAMIN WITH MINERALS) TABS Take 1 tablet by mouth daily.        Marland Kitchen omeprazole (PRILOSEC) 20 MG capsule Take 20 mg by mouth 2 (two) times daily.        . traMADol (ULTRAM-ER) 100 MG 24 hr tablet Take 100 mg by mouth daily.        . vitamin B-12 (CYANOCOBALAMIN) 1000 MCG tablet Place 1,000 mcg under the tongue daily.        Marland Kitchen acetaminophen (TYLENOL) 500 MG tablet Take 1,000 mg by mouth 3 (three) times daily as needed. For pain not to exceed 3 gm per day       . albuterol (VENTOLIN HFA) 108 (90 BASE) MCG/ACT inhaler Inhale 2 puffs into the lungs every 4 (four)  hours as needed. For shortness of breath. Wait 1 minute between each puff.      . Multiple Vitamins-Minerals (ICAPS AREDS FORMULA PO) Take 1 tablet by mouth 2 (two) times daily.        . nitroGLYCERIN (NITROSTAT) 0.4 MG SL tablet Place 0.4 mg under the tongue every 5 (five) minutes as needed. For chest pain up to 3 doses       . Omega-3 Fatty Acids (SEA-OMEGA 30 PO) Take 1 capsule by mouth 2 (two) times daily.        . polyethylene glycol powder (GLYCOLAX/MIRALAX) powder Take 17 g by mouth daily as needed.        . warfarin (COUMADIN) 2.5 MG tablet Take 2.5 mg by mouth daily. On Monday, Wednesday, and Friday       . warfarin (COUMADIN) 5 MG tablet Take 5 mg by mouth daily. On Tuesday, Thursday, Saturday, and Sunday        Scheduled:     . aspirin  324 mg Oral Once  . Fluticasone-Salmeterol  1 puff Inhalation BID  . furosemide  20 mg Oral Daily  . metoprolol  150 mg Oral Daily  . nitroGLYCERIN  0.2 mg Transdermal Daily  . pantoprazole  40 mg Oral Q1200  . simvastatin  20 mg Oral QPC supper  . warfarin  2.5 mg Oral NOW  . DISCONTD: warfarin  2.5 mg Oral Custom  .  DISCONTD: warfarin  5 mg Oral Custom   Infusions:   PRN: albuterol, nitroGLYCERIN, polyethylene glycol, DISCONTD: polyethylene glycol powder  Assessment: 78 yo SNF resident admitted with SOB and CP. On Coumadin PTA for hx Afib. INR is within therapeutic range. No bleeding reported.  Goal of Therapy:  INR 2-3   Plan:  Coumadin 5mg  tonight as per home regimen. F/U am INR.   Charolotte Eke, PharmD, pager [phone]. 05/22/2011,1:42 PM.

## 2011-05-22 NOTE — Progress Notes (Signed)
PATIENT DETAILS Name: [name] Age: 78 y.o. Sex: female Date of Birth: [birth year] Admit Date: 05/21/2011 ZOX:[id number] DAVIDSON, MD, MD Emergency contact:   CONSULTS: 1.  Dr. Viann Fish, Cardiology  Interval History: Mrs. Rake is a 78 year old female with a known h/o CAD status post stenting x5 presented to the ER on 05/21/11 with SOB and chest pressure that improved with NTG. Her BNP was elevated.  Because of her cardiac history, Dr. Viann Fish was consulted and he saw the patient on 05/21/11.  He recommended a 2 D Echo and conservative treatment.  Cardiac markers were cycled Q 8 hours X 3 sets and were negative.  Her chest pain is felt to be secondary to a acute vs. Subacute left fifth rib fracture.    ROS: The patient denies current chest pain.  Has had a cough with "cream" colored sputum.     Objective: Vital signs in last 24 hours: Temp:  [97.4 F (36.3 C)-98 F (36.7 C)] 98 F (36.7 C) (01/08 0545) Pulse Rate:  [59-67] 67  (01/08 1010) Resp:  [18-23] 20  (01/08 0545) BP: (107-151)/(61-80) 137/80 mmHg (01/08 1010) SpO2:  [92 %-99 %] 92 % (01/08 0545) Weight:  [58.8 kg (129 lb 10.1 oz)] 129 lb 10.1 oz (58.8 kg) (01/07 2145) Weight change:  Last BM Date: 05/21/11  Intake/Output from previous day:  Intake/Output Summary (Last 24 hours) at 05/22/11 1445 Last data filed at 05/22/11 0900  Gross per 24 hour  Intake    360 ml  Output    600 ml  Net   -240 ml     Physical Exam:  Gen:  NAD Cardiovascular:  RRR, No M/R/G Respiratory: Lungs CTAB Gastrointestinal: Abdomen soft, NT/ND with normal active bowel sounds. Extremities: No C/E/C     Lab Results: Basic Metabolic Panel:  Lab 05/21/11 1914  NA 140  K 3.9  CL 106  CO2 23  GLUCOSE 116*  BUN 19  CREATININE 0.60  CALCIUM 9.6  MG --  PHOS --   GFR Estimated Creatinine Clearance: 36.3 ml/min (by C-G formula based on Cr of 0.6).  Coagulation profile  Lab 05/22/11 0320 05/21/11 1545    INR 2.14* 2.30*  PROTIME -- --    CBC:  Lab 05/21/11 1545  WBC 6.6  NEUTROABS 4.0  HGB 11.2*  HCT 34.4*  MCV 95.0  PLT 232   Cardiac Enzymes:  Lab 05/22/11 1046 05/22/11 0320 05/21/11 1907 05/21/11 1545  CKTOTAL 45 43 45 46  CKMB 1.9 2.1 1.9 2.2  CKMBINDEX -- -- -- --  TROPONINI <0.30 <0.30 <0.30 <0.30    Ref. Range 05/21/2011 15:20  Pro B Natriuretic peptide (BNP) Latest Range: 0-450 pg/mL 1058.0 (H)   Lipid Profile  Basename 05/22/11 0320  CHOL 124  HDL 57  LDLCALC 50  TRIG 84  CHOLHDL 2.2  LDLDIRECT --   Microbiology Recent Results (from the past 240 hour(s))  MRSA PCR SCREENING     Status: Normal   Collection Time   05/21/11 11:12 PM      Component Value Range Status Comment   MRSA by PCR NEGATIVE  NEGATIVE  Final     Recent Results (from the past 240 hour(s))  MRSA PCR SCREENING     Status: Normal   Collection Time   05/21/11 11:12 PM      Component Value Range Status Comment   MRSA by PCR NEGATIVE  NEGATIVE  Final     Studies/Results: Dg Chest 2 View  05/21/2011  *RADIOLOGY REPORT*  Clinical Data: Coughing, shortness of breath, and weakness  CHEST - 2 VIEW  Comparison: CT chest 07/20/2010 and chest radiograph 07/20/2010  Findings: Stable cardiomegaly and mediastinal contours. Coronary artery stent noted.  Left subclavian dual lead pacer device with leads terminating in the right atrium and right ventricle is stable.  Pulmonary vascularity is within normal limits.  The lungs are normally expanded and clear.  Negative for edema, focal airspace opacity, effusion, or pneumothorax.  The bones are osteopenic.  There is a stable compression fracture of  the mid thoracic spine vertebral body (either T6 or T7), compared to prior chest CT. Degenerative changes of both shoulders.  There is irregularity and offset of the lateral arc of the left fifth rib, suspicious for an acute or subacute fracture.  IMPRESSION:  1.  Stable cardiomegaly and dual lead pacemaker.  No acute  cardiopulmonary disease identified. 2.  Question acute or subacute left fifth rib fracture. 3.  Chronic single level mid thoracic spine compression fracture.  Per CMS PQRS reporting requirements (PQRS Measure 24): Given the patient's age of greater than 50 and the fracture site (hip, distal radius, or spine), the patient should be tested for osteoporosis using DXA, and the appropriate treatment considered based on the DXA results.  Original Report Authenticated By: Britta Mccreedy, M.D.    Medications: Scheduled Meds:    . aspirin  324 mg Oral Once  . Fluticasone-Salmeterol  1 puff Inhalation BID  . furosemide  20 mg Oral Daily  . metoprolol  150 mg Oral Daily  . nitroGLYCERIN  0.2 mg Transdermal Daily  . pantoprazole  40 mg Oral Q1200  . simvastatin  20 mg Oral QPC supper  . warfarin  2.5 mg Oral NOW  . warfarin  5 mg Oral ONCE-1800  . DISCONTD: warfarin  2.5 mg Oral Custom  . DISCONTD: warfarin  5 mg Oral Custom   Continuous Infusions:  PRN Meds:.albuterol, nitroGLYCERIN, polyethylene glycol, DISCONTD: polyethylene glycol powder Antibiotics: Anti-infectives    None       Assessment/Plan:  Principal Problem:  *Chest pain at rest secondary to  Rib fracture The patient describes intermittent chest pain and cough over the past 2-3 months.  CXR shows ? Acute vs. Subacute rib fracture.  I suspect the patient's chest pain is due to re-injury of rib fracture by chronic cough, likely triggered by an URI.  At this point, would suppress the patient's cough and F/U with her 2 D Echo.  She has been seen by cardiology with recommendations to continue with conservative treatment.  She does not have any clinical features suggestive of decompensated CHF, and her elevated pro-BNP likely reflects decreased renal clearance given her GFR of 76. Active Problems:  CAD Cardiac markers were cycled Q 8 hours x 3 sets and were negative.  Continue coumadin.  Stop ASA since on coumadin.  Continue long-acting  nitrate per cardiology recommendations.  Paroxysmal atrial fibrillation Currently in NSR.  Rate controlled (Atrial paced with pacemaker).  INR therapeutic on coumadin.  Dyspnea No current complaints of dyspnea.  CXR clear.  Compression fracture of thoracic vertebra Given the patient's age of greater than 50 and the fracture site (hip, distal radius, or spine), the patient should be tested for osteoporosis using DXA, and the appropriate treatment considered based on the DXA results. Will defer testing to the patient's PCP.  *The patient's daughter was at the bedside.  I updated the patient and her family regarding the plan of  care, and likelihood of d/c back to her ALF once her 2 D Echo report was reviewed, and PT/OT evaluations were performed.   LOS: 1 day   Hillery Aldo, MD Pager [phone]  05/22/2011, 2:45 PM

## 2011-05-22 NOTE — Progress Notes (Signed)
CSW visited with the pt and daughter in law and completed psychosocial assessment (pls see shadow chart). Pt reports she has been a resident of [facility] for over 5 years and she plans to return at discharge. Pt states her family is really supportive. Pt is hopeful to return to the facility today or tomorrow. CSW has left a voice message for Wille Celeste at [facility] to see if there are any questions or concerns regarding the pt returning to ALF. CSW will continue to follow the pt and offer support.  Patrice Paradise, LCSWA 05/22/2011 9:49 AM [phone]

## 2011-05-22 NOTE — Progress Notes (Signed)
*  PRELIMINARY RESULTS* Echocardiogram 2D Echocardiogram has been performed.  Glean Salen [facility] 05/22/2011, 11:26 AM

## 2011-05-23 LAB — BASIC METABOLIC PANEL
CO2: 28 mEq/L (ref 19–32)
Calcium: 9.4 mg/dL (ref 8.4–10.5)
Creatinine, Ser: 0.61 mg/dL (ref 0.50–1.10)
GFR calc non Af Amer: 76 mL/min — ABNORMAL LOW (ref >90)
Glucose, Bld: 109 mg/dL — ABNORMAL HIGH (ref 70–99)
Sodium: 139 mEq/L (ref 135–145)

## 2011-05-23 LAB — PROTIME-INR: Prothrombin Time: 25.4 seconds — ABNORMAL HIGH (ref 11.6–15.2)

## 2011-05-23 LAB — CBC
MCH: 31.4 pg (ref 26.0–34.0)
MCHC: 32.8 g/dL (ref 30.0–36.0)
MCV: 95.8 fL (ref 78.0–100.0)
Platelets: 231 10*3/uL (ref 150–400)
RBC: 3.6 MIL/uL — ABNORMAL LOW (ref 3.87–5.11)

## 2011-05-23 MED ORDER — NITROGLYCERIN 0.2 MG/HR TD PT24: 1.0000 | MEDICATED_PATCH | Freq: Every day | TRANSDERMAL | Status: DC

## 2011-05-23 MED ORDER — DEXTROMETHORPHAN POLISTIREX 30 MG/5ML PO LQCR: 30.0000 mg | ORAL | Status: AC | PRN

## 2011-05-23 MED ORDER — WARFARIN SODIUM 2.5 MG PO TABS
2.5000 mg | ORAL_TABLET | Freq: Once | ORAL | Status: DC
  Filled 2011-05-23: qty 1

## 2011-05-23 MED ORDER — METOPROLOL SUCCINATE ER 50 MG PO TB24: 150.0000 mg | ORAL_TABLET | Freq: Every day | ORAL | Status: DC

## 2011-05-23 MED ORDER — HYDROCODONE-ACETAMINOPHEN 10-325 MG PO TABS: 1.0000 | ORAL_TABLET | Freq: Three times a day (TID) | ORAL | Status: DC | PRN

## 2011-05-23 NOTE — Progress Notes (Signed)
Physical Therapy Evaluation One time eval and D/C from acute PT Patient Details Name: MCKENNA GAMM MRN: [id number] DOB: Aug 09, 1915 Today's Date: 05/23/2011 Time: 902-922 Charge: EVII  Problem List:  Patient Active Problem List  Diagnoses  . HYPERTENSION  . CAD  . Paroxysmal atrial fibrillation  . Sinoatrial node dysfunction  . Chest pain at rest  . Dyspnea  . Cardiac pacemaker in situ  . Long-term (current) use of anticoagulants  . Rib fracture  . Compression fracture of thoracic vertebra    Past Medical History:  Past Medical History  Diagnosis Date  . Transient ischemic attack (TIA)   . Atrial fibrillation   . Hypertension   . Dyslipidemia   . Gastritis   . Vitamin B12 deficiency   . Peptic ulcer disease   . Osteopenia   . Macular degeneration, right eye   . Arthritis     back, shoulders  . Cough 05/21/2011    pt states cough x 2 days  . Edema     left leg  . Hypoglycemia   . Urinary incontinence   . CAD 06/15/2009  . Sinoatrial node dysfunction 06/15/2009   Past Surgical History:  Past Surgical History  Procedure Date  . Perforated ulcer   . Eye surgery     bilateral  . Pacemaker insertion   . Hip surgery     broke hip and has pin in it  . Drug-eluting stent 2008,209    2009 to LAD  . Cardiac catheterization     PT Assessment/Plan/Recommendation PT Assessment Clinical Impression Statement: Pt currently at supervision level with mobility.  Recommend return to ALF with HHPT for strengthening exercises.  Pt may d/c later today.  SaO2 on room air 95-96% with ambulation.  Will defer further PT needs to HHPT.  Acute PT to sign off. PT Recommendation/Assessment: Patent does not need any further PT services (no further acute needs as anticipated d/c today) No Skilled PT: Patient is supervision for all activity/mobility PT Recommendation Recommendations for Other Services:  (seen with OT) Follow Up Recommendations: Home health PT Equipment Recommended: None  recommended by PT PT Goals     PT Evaluation Precautions/Restrictions  Precautions Precautions: Fall Restrictions Weight Bearing Restrictions: No Prior Functioning  Home Living Lives With: Other ([facility]) [facility]) Receives Help From: Other (Comment) (ALF Staff PRN) Type of Home: Assisted living Home Layout: One level;Other (Comment) (Pt has call bell in room @ ALF) Home Access: Level entry Bathroom Shower/Tub: Walk-in shower Bathroom Toilet: Handicapped height Bathroom Accessibility: Yes Home Adaptive Equipment: Bedside commode/3-in-1;Walker - four wheeled;Raised toilet seat with rails;Other (comment) Arts administrator) Additional Comments: Pt uses rollator for mobility Prior Function Level of Independence: Independent with basic ADLs;Other (comment);Independent with transfers (PRN assist to don/doff compression stockings) Driving: No Comments: ambulates approx 60 feet to dining hall for meals Cognition Cognition Arousal/Alertness: Awake/alert Overall Cognitive Status: Appears within functional limits for tasks assessed Sensation/Coordination   Extremity Assessment RLE Strength Right Hip Flexion: 3+/5 Right Hip ABduction: 3+/5 Right Hip ADduction: 4/5 Right Knee Extension: 4/5 Right Ankle Dorsiflexion: 4/5 LLE Strength Left Hip Flexion: 3+/5 Left Hip ABduction: 3+/5 Left Hip ADduction: 4/5 Left Knee Extension: 4/5 Left Ankle Dorsiflexion: 4/5 Mobility (including Balance) Bed Mobility Bed Mobility: Yes Supine to Sit: 6: Modified independent (Device/Increase time) Transfers Transfers: Yes Sit to Stand: 5: Supervision Sit to Stand Details (indicate cue type and reason): verbal cue for hand placement Stand to Sit: 5: Supervision Stand to Sit Details: verbal cue for  hand placement and to take RW back to chair Ambulation/Gait Ambulation/Gait: Yes Ambulation/Gait Assistance: 5: Supervision Ambulation/Gait Assistance Details (indicate cue type and reason): pt  keeps RW forward, likely from normally accustomed to use of rollator with seat Ambulation Distance (Feet): 240 Feet Assistive device: Rolling walker Gait Pattern: Step-through pattern    Exercise    End of Session PT - End of Session Activity Tolerance: Patient tolerated treatment well Patient left: in chair;with call bell in reach;with family/visitor present General Behavior During Session: [facility] for tasks performed Cognition: [facility] for tasks performed  Mikayah Joy,KATHrine E 05/23/2011, 9:38 AM Pager: [phone]

## 2011-05-23 NOTE — Progress Notes (Signed)
Patient Name: [name] Date of Encounter: 05/23/2011    SUBJECTIVE: Breathing is much better. Had brisk diuresis.  TELEMETRY:  AV sequential pacing.: Filed Vitals:   05/22/11 1410 05/22/11 2210 05/22/11 2223 05/23/11 0545  BP: 114/66 116/72  162/59  Pulse: 66 60  59  Temp: 97.6 F (36.4 C) 97.7 F (36.5 C)  97.6 F (36.4 C)  TempSrc: Oral     Resp: 20 19  22   Height:      Weight:    57.698 kg (127 lb 3.2 oz)  SpO2: 94% 93% 93% 96%    Intake/Output Summary (Last 24 hours) at 05/23/11 0714 Last data filed at 05/23/11 0500  Gross per 24 hour  Intake    360 ml  Output   1050 ml  Net   -690 ml    LABS: Basic Metabolic Panel:  Basename 05/21/11 1545  NA 140  K 3.9  CL 106  CO2 23  GLUCOSE 116*  BUN 19  CREATININE 0.60  CALCIUM 9.6  MG --  PHOS --   CBC:  Basename 05/23/11 0435 05/21/11 1545  WBC 5.6 6.6  NEUTROABS -- 4.0  HGB 11.3* 11.2*  HCT 34.5* 34.4*  MCV 95.8 95.0  PLT 231 232   Cardiac Enzymes:  Basename 05/22/11 1046 05/22/11 0320 05/21/11 1907  CKTOTAL 45 43 45  CKMB 1.9 2.1 1.9  CKMBINDEX -- -- --  TROPONINI <0.30 <0.30 <0.30    Basename 05/22/11 0320  CHOL 124  HDL 57  LDLCALC 50  TRIG 84  CHOLHDL 2.2  LDLDIRECT --  ECHOCARDIOGRAM: Study Conclusions  - Left ventricle: Systolic function was normal. The estimated ejection fraction was in the range of 60% to 65%. Regional wall motion abnormalities cannot be excluded. There is probable basal inferior wall hypokinesis. Features are consistent with a pseudonormal left ventricular filling pattern, with concomitant abnormal relaxation and increased filling pressure (grade 2 diastolic dysfunction). - Aortic valve: Mild regurgitation. - Mitral valve: Calcified annulus. Mildly thickened leaflets . Mild to moderate regurgitation. - Left atrium: The atrium was mildly dilated. - Pulmonary arteries: Systolic pressure was mildly increased. PA peak pressure: 34mm Hg  (S). Impressions:  - Unable to view prior study for comparison.     Radiology/Studies: No new data  Physical Exam: Blood pressure 162/59, pulse 59, temperature 97.6 F (36.4 C), temperature source Oral, resp. rate 22, height 5\' 3"  (1.6 m), weight 57.698 kg (127 lb 3.2 oz), SpO2 96.00%. Weight change: -1.103 kg (-2 lb 6.9 oz)   Basilar crackles. No murmur. No JVD  ASSESSMENT:  1. CAD and no evidence MI 2. Chest pain, non-ischemic 3. Acute on chronic diastolic HF, improved with diuresis   Plan:  1. D/c diuretic 2. No specific further cardiac recommendations. 3. BMET  Signed, Lesleigh Noe 05/23/2011, 7:14 AM

## 2011-05-23 NOTE — Progress Notes (Signed)
Subjective: No specific complaints. Denies any shortness of breath.  Objective: Vital signs in last 24 hours: Filed Vitals:   05/22/11 2223 05/23/11 0545 05/23/11 0930 05/23/11 0952  BP:  162/59    Pulse:  59    Temp:  97.6 F (36.4 C)    TempSrc:      Resp:  22    Height:      Weight:  57.698 kg (127 lb 3.2 oz)    SpO2: 93% 96% 95% 96%   Weight change: -1.103 kg (-2 lb 6.9 oz)  Intake/Output Summary (Last 24 hours) at 05/23/11 1005 Last data filed at 05/23/11 0500  Gross per 24 hour  Intake    120 ml  Output   1050 ml  Net   -930 ml    Physical Exam: General: Awake, Oriented, No acute distress. HEENT: EOMI. Neck: Supple CV: S1 and S2 Lungs: Clear to ascultation bilaterally Abdomen: Soft, Nontender, Nondistended, +bowel sounds. Ext: Good pulses. Trace edema.  Lab Results:  [facility] 05/23/11 0802 05/21/11 1545  NA 139 140  K 3.8 3.9  CL 104 106  CO2 28 23  GLUCOSE 109* 116*  BUN 14 19  CREATININE 0.61 0.60  CALCIUM 9.4 9.6  MG -- --  PHOS -- --   No results found for this basename: AST:2,ALT:2,ALKPHOS:2,BILITOT:2,PROT:2,ALBUMIN:2 in the last 72 hours No results found for this basename: LIPASE:2,AMYLASE:2 in the last 72 hours  Basename 05/23/11 0435 05/21/11 1545  WBC 5.6 6.6  NEUTROABS -- 4.0  HGB 11.3* 11.2*  HCT 34.5* 34.4*  MCV 95.8 95.0  PLT 231 232    Basename 05/22/11 1046 05/22/11 0320 05/21/11 1907  CKTOTAL 45 43 45  CKMB 1.9 2.1 1.9  CKMBINDEX -- -- --  TROPONINI <0.30 <0.30 <0.30   No components found with this basename: POCBNP:3 No results found for this basename: DDIMER:2 in the last 72 hours No results found for this basename: HGBA1C:2 in the last 72 hours  Basename 05/22/11 0320  CHOL 124  HDL 57  LDLCALC 50  TRIG 84  CHOLHDL 2.2  LDLDIRECT --   No results found for this basename: TSH,T4TOTAL,FREET3,T3FREE,THYROIDAB in the last 72 hours No results found for this basename:  VITAMINB12:2,FOLATE:2,FERRITIN:2,TIBC:2,IRON:2,RETICCTPCT:2 in the last 72 hours  Micro Results: Recent Results (from the past 240 hour(s))  MRSA PCR SCREENING     Status: Normal   Collection Time   05/21/11 11:12 PM      Component Value Range Status Comment   MRSA by PCR NEGATIVE  NEGATIVE  Final     Studies/Results: Dg Chest 2 View  05/21/2011  *RADIOLOGY REPORT*  Clinical Data: Coughing, shortness of breath, and weakness  CHEST - 2 VIEW  Comparison: CT chest 07/20/2010 and chest radiograph 07/20/2010  Findings: Stable cardiomegaly and mediastinal contours. Coronary artery stent noted.  Left subclavian dual lead pacer device with leads terminating in the right atrium and right ventricle is stable.  Pulmonary vascularity is within normal limits.  The lungs are normally expanded and clear.  Negative for edema, focal airspace opacity, effusion, or pneumothorax.  The bones are osteopenic.  There is a stable compression fracture of  the mid thoracic spine vertebral body (either T6 or T7), compared to prior chest CT. Degenerative changes of both shoulders.  There is irregularity and offset of the lateral arc of the left fifth rib, suspicious for an acute or subacute fracture.  IMPRESSION:  1.  Stable cardiomegaly and dual lead pacemaker.  No acute cardiopulmonary disease identified.  2.  Question acute or subacute left fifth rib fracture. 3.  Chronic single level mid thoracic spine compression fracture.  Per CMS PQRS reporting requirements (PQRS Measure 24): Given the patient's age of greater than 50 and the fracture site (hip, distal radius, or spine), the patient should be tested for osteoporosis using DXA, and the appropriate treatment considered based on the DXA results.  Original Report Authenticated By: Britta Mccreedy, M.D.    Medications: I have reviewed the patient's current medications. Scheduled Meds:   . dextromethorphan  30 mg Oral BID  . Fluticasone-Salmeterol  1 puff Inhalation BID  .  metoprolol  150 mg Oral Daily  . nitroGLYCERIN  0.2 mg Transdermal Daily  . pantoprazole  40 mg Oral Q1200  . simvastatin  20 mg Oral QPC supper  . warfarin  5 mg Oral ONCE-1800  . DISCONTD: furosemide  20 mg Oral Daily   Continuous Infusions:  PRN Meds:.albuterol, nitroGLYCERIN, polyethylene glycol 2D ECHO on 05/22/2011 Study Conclusions - Left ventricle: Systolic function was normal. The estimated ejection fraction was in the range of 60% to 65%. Regional wall motion abnormalities cannot be excluded. There is probable basal inferior wall hypokinesis. Features are consistent with a pseudonormal left ventricular filling pattern, with concomitant abnormal relaxation and increased filling pressure (grade2 diastolic dysfunction). - Aortic valve: Mild regurgitation. - Mitral valve: Calcified annulus. Mildly thickened leaflets . Mild to moderate regurgitation. - Left atrium: The atrium was mildly dilated. - Pulmonary arteries: Systolic pressure was mildly increased. PA peak pressure: 34mm Hg (S).    Assessment/Plan: 1. Chest pain at rest secondary to Rib fracture  The patient describes intermittent chest pain and cough over the past 2-3 months. CXR shows ? Acute vs. Subacute rib fracture. Suspect the patient's chest pain is due to re-injury of rib fracture by chronic cough, likely triggered by an URI. At this point, would suppress the patient's cough.  Echocardiogram with results as indicated above.  Cardiology this times recommends no further workup.  She does not have any clinical features suggestive of decompensated CHF, and her elevated pro-BNP likely reflects decreased renal clearance given her GFR of 76.   2. CAD  Cardiac markers were cycled Q 8 hours x 3 sets and were negative. Continue coumadin. Stop ASA since on coumadin. Continue long-acting nitrate per cardiology recommendations.   3. Paroxysmal atrial fibrillation  Currently in NSR. Rate controlled (Atrial paced with pacemaker). INR  therapeutic on coumadin.   4. Dyspnea  No current complaints of dyspnea. CXR clear.   5. Compression fracture of thoracic vertebra  Given the patient's age of greater than 50 and the fracture site (hip, distal radius, or spine), the patient should be tested for osteoporosis using DXA, and the appropriate treatment considered based on the DXA results. Will defer testing to the patient's PCP.   The patient son was at bedside.  Updated patient and family.  Discharge the patient back to assisted living facility.  PT recommended home health PT.  No OT needs identified.   LOS: 2 days  Ibrahima Holberg A, MD 05/23/2011, 10:05 AM

## 2011-05-23 NOTE — Progress Notes (Signed)
CSW confirmed with Wille Celeste at [facility] that discharge clinicals were received and it was okay for the pt to transport to the facility. Pts son plans to transport the pt back to the facility. CSW has spoken with RN Victorino Dike and let her know pt could return to the facility. CSW has compiled information to transport with the pt.  CSW signing off at this time.  Patrice Paradise, LCSWA 05/23/2011 3:18 PM [phone]

## 2011-05-23 NOTE — Progress Notes (Signed)
Occupational Therapy Evaluation Patient Details Name: [name] MRN: [id number] DOB: 1915/06/25 Today's Date: 05/23/2011 9:00-9:24am Ev II, Indirect 1 Problem List:  Patient Active Problem List  Diagnoses  . HYPERTENSION  . CAD  . Paroxysmal atrial fibrillation  . Sinoatrial node dysfunction  . Chest pain at rest  . Dyspnea  . Cardiac pacemaker in situ  . Long-term (current) use of anticoagulants  . Rib fracture  . Compression fracture of thoracic vertebra    Past Medical History:  Past Medical History  Diagnosis Date  . Transient ischemic attack (TIA)   . Atrial fibrillation   . Hypertension   . Dyslipidemia   . Gastritis   . Vitamin B12 deficiency   . Peptic ulcer disease   . Osteopenia   . Macular degeneration, right eye   . Arthritis     back, shoulders  . Cough 05/21/2011    pt states cough x 2 days  . Edema     left leg  . Hypoglycemia   . Urinary incontinence   . CAD 06/15/2009  . Sinoatrial node dysfunction 06/15/2009   Past Surgical History:  Past Surgical History  Procedure Date  . Perforated ulcer   . Eye surgery     bilateral  . Pacemaker insertion   . Hip surgery     broke hip and has pin in it  . Drug-eluting stent 2008,209    2009 to LAD  . Cardiac catheterization     OT Assessment/Plan/Recommendation OT Assessment Clinical Impression Statement: Pt is overall Mod I funct mobility in room, bathroom, hallway today using RW. She has no further acute OT needs at this time. Recommend ALF staff provide PRN/SBA w/ shower transfers initially. D/C OT at this time/sign off. OT Recommendation/Assessment: Patient does not need any further OT services OT Recommendation Recommendations for Other Services: Other (comment) (HHPT) Follow Up Recommendations: No OT follow up/Sign off Acute OT at this time. Equipment Recommended: None recommended by PT    OT Evaluation Precautions/Restrictions  Precautions Precautions: Fall Restrictions Weight  Bearing Restrictions: No Prior Functioning Home Living Lives With: Other [facility]) Receives Help From: Other (Comment) (ALF Staff PRN) Type of Home: Assisted living Home Layout: One level;Other (Comment) (Pt has call bell in room @ ALF) Home Access: Level entry Bathroom Shower/Tub: Walk-in shower Bathroom Toilet: Handicapped height Bathroom Accessibility: Yes Home Adaptive Equipment: Bedside commode/3-in-1;Walker - four wheeled;Raised toilet seat with rails;Other (comment) Arts administrator) Additional Comments: Pt uses rollator for mobility Prior Function Level of Independence: Independent with basic ADLs;Other (comment);Independent with transfers (PRN assist to don/doff compression stockings) Driving: No Comments: ambulates approx 60 feet to dining hall for meals ADL ADL Eating/Feeding: Performed;Independent Where Assessed - Eating/Feeding: Bed level;Chair Grooming: Simulated;Modified independent Where Assessed - Grooming: Standing at sink;Sitting, chair Upper Body Bathing: Simulated;Modified independent Where Assessed - Upper Body Bathing: Sitting, bed;Unsupported Lower Body Bathing: Simulated;Supervision/safety Lower Body Bathing Details (indicate cue type and reason): PRN assist for feet/back, will need this when returns to ALF Where Assessed - Lower Body Bathing: Sitting, bed;Sitting at sink;Unsupported Upper Body Dressing: Performed;Modified independent Where Assessed - Upper Body Dressing: Sitting, bed Lower Body Dressing: Performed;Minimal assistance Lower Body Dressing Details (indicate cue type and reason): Min assist don sock/compression hose Where Assessed - Lower Body Dressing: Sitting, chair Toilet Transfer: Performed;Modified independent Toilet Transfer Method: Proofreader: Raised toilet seat with arms (or 3-in-1 over toilet) Toileting - Clothing Manipulation: Simulated;Modified independent Where Assessed - Toileting Clothing  Manipulation: Sit  to stand from 3-in-1 or toilet Toileting - Hygiene: Simulated;Modified independent Where Assessed - Toileting Hygiene: Sit to stand from 3-in-1 or toilet;Sit on 3-in-1 or toilet Tub/Shower Transfer: Not assessed;Other (comment) (Rec pt have SBA @ d/c to ALF initially) Tub/Shower Transfer Method: Ambulating;Not assessed Tub/Shower Transfer Equipment: Grab bars;Walk in shower Equipment Used: Rolling walker;Other (comment) (3:1 over toilet in pt room) ADL Comments: Pt is overall Mod I funct mobility in room, bathroom, hallway today using RW. She has no further acute OT needs at this time. Recommend ALF staff provide PRN/SBA w/ shower transfers initially. D/C OT at this time/sign off. Vision/Perception  Vision - History Baseline Vision: Wears glasses only for reading Visual History: Macular degeneration Patient Visual Report: No change from baseline Cognition Cognition Arousal/Alertness: Awake/alert Overall Cognitive Status: Appears within functional limits for tasks assessed Sensation/Coordination Sensation Light Touch: Appears Intact Coordination Gross Motor Movements are Fluid and Coordinated: Yes Fine Motor Movements are Fluid and Coordinated: Yes Extremity Assessment RUE Assessment RUE Assessment: Within Functional Limits LUE Assessment LUE Assessment: Within Functional Limits Mobility  Bed Mobility Bed Mobility: Yes Supine to Sit: 6: Modified independent (Device/Increase time) Transfers Sit to Stand: 5: Supervision Sit to Stand Details (indicate cue type and reason): verbal cue for hand placement Stand to Sit: 5: Supervision;To chair/3-in-1;6: Modified independent (Device/Increase time);To elevated surface;With armrests;Without upper extremity assist Stand to Sit Details: verbal cue for hand placement and to take RW back to chair   End of Session OT - End of Session Equipment Utilized During Treatment: Other (comment) (3:1 over toilet; RW) Activity  Tolerance: Patient tolerated treatment well;Patient limited by fatigue Patient left: in chair;with call bell in reach;with family/visitor present General Behavior During Session: [facility] for tasks performed Cognition: [facility] for tasks performed   Alm Bustard 05/23/2011, 9:43 AM

## 2011-05-23 NOTE — Discharge Summary (Signed)
Discharge Summary  [name] MR#: [id number]  DOB:[birth year]  Date of Admission: 05/21/2011 Date of Discharge: 05/23/2011  Patient's PCP: Londell Moh, MD, MD  Patient's Cardiologist: Dr. Verdis Prime  Attending Physician:Destenie Ingber A  Consults: Treatment Team:  Lesleigh Noe, MD, cardiology  Discharge Diagnoses: Principal Problem:  *Chest pain at rest Active Problems:  CAD  Paroxysmal atrial fibrillation  Dyspnea  Rib fracture  Compression fracture of thoracic vertebra  history of TIA Hypertension. Hyperlipidemia. Vitamin B 12 deficiency. Peptic ulcer disease. History of osteopenia. History of coronary disease status post stenting x5.  Brief Admitting History and Physical 78 year old Caucasian female with history of coronary disease status post stenting x5 presented with shortness of breath and chest pressure on 05/21/2011.  Discharge Medications Current Discharge Medication List    START taking these medications   Details  dextromethorphan (DELSYM) 30 MG/5ML liquid Take 5 mLs (30 mg total) by mouth as needed for cough. Qty: 90 mL, Refills: 0    nitroGLYCERIN (NITRODUR - DOSED IN MG/24 HR) 0.2 mg/hr Place 1 patch (0.2 mg total) onto the skin daily. Qty: 30 patch, Refills: 0      CONTINUE these medications which have CHANGED   Details  metoprolol (TOPROL-XL) 50 MG 24 hr tablet Take 3 tablets (150 mg total) by mouth daily. Qty: 90 tablet, Refills: 0      CONTINUE these medications which have NOT CHANGED   Details  atorvastatin (LIPITOR) 10 MG tablet Take 10 mg by mouth every morning.      benzocaine-menthol (CHLORAEPTIC) 6-10 MG lozenge Take 1 lozenge by mouth every 2 (two) hours as needed. For sore throat      calcium carbonate (OS-CAL) 600 MG TABS Take 600 mg by mouth 2 (two) times daily.      Cholecalciferol (VITAMIN D3) 1000 UNITS CAPS Take 1 capsule by mouth 2 (two) times daily.      docusate (COLACE) 50 MG/5ML liquid Place 50 mg  into both ears as directed. Instill 3-5 drops into both ears at bedtime every Saturday & Sunday once a month     Fluticasone-Salmeterol (ADVAIR DISKUS) 100-50 MCG/DOSE AEPB Inhale 1 puff into the lungs 2 (two) times daily. Rinse mouth after use.     HYDROcodone-acetaminophen (NORCO) 10-325 MG per tablet Take 1 tablet by mouth 3 (three) times daily as needed. For pain.     !! Multiple Vitamin (MULITIVITAMIN WITH MINERALS) TABS Take 1 tablet by mouth daily.      omeprazole (PRILOSEC) 20 MG capsule Take 20 mg by mouth 2 (two) times daily.      traMADol (ULTRAM-ER) 100 MG 24 hr tablet Take 100 mg by mouth daily.      vitamin B-12 (CYANOCOBALAMIN) 1000 MCG tablet Place 1,000 mcg under the tongue daily.      acetaminophen (TYLENOL) 500 MG tablet Take 1,000 mg by mouth 3 (three) times daily as needed. For pain not to exceed 3 gm per day     albuterol (VENTOLIN HFA) 108 (90 BASE) MCG/ACT inhaler Inhale 2 puffs into the lungs every 4 (four) hours as needed. For shortness of breath. Wait 1 minute between each puff.    !! Multiple Vitamins-Minerals (ICAPS AREDS FORMULA PO) Take 1 tablet by mouth 2 (two) times daily.      nitroGLYCERIN (NITROSTAT) 0.4 MG SL tablet Place 0.4 mg under the tongue every 5 (five) minutes as needed. For chest pain up to 3 doses     Omega-3 Fatty Acids (SEA-OMEGA 30 PO) Take  1 capsule by mouth 2 (two) times daily.      polyethylene glycol powder (GLYCOLAX/MIRALAX) powder Take 17 g by mouth daily as needed.      !! warfarin (COUMADIN) 2.5 MG tablet Take 2.5 mg by mouth daily. On Monday, Wednesday, and Friday     !! warfarin (COUMADIN) 5 MG tablet Take 5 mg by mouth daily. On Tuesday, Thursday, Saturday, and Sunday      !! - Potential duplicate medications found. Please discuss with provider.      Hospital Course: 1. Chest pain at rest secondary to Rib fracture  The patient describes intermittent chest pain and cough over the past 2-3 months. CXR shows ? Acute vs.  Subacute rib fracture. Suspect the patient's chest pain is due to re-injury of rib fracture by chronic cough, likely triggered by an URI. Pain was helped by cough suppressants. Echocardiogram with results as indicated below.  Cardiology, Dr. Verdis Prime, evaluated the patient and at this time this times recommended no further workup.  She does not have any clinical features suggestive of decompensated CHF, and her elevated pro-BNP likely reflects decreased renal clearance given her GFR of 76.   2. CAD  Cardiac markers were cycled Q 8 hours x 3 sets and were negative. Continue coumadin. Stop ASA since on coumadin. Continue long-acting nitrate per cardiology recommendations.   3. Paroxysmal atrial fibrillation  Currently in NSR. Rate controlled (Atrial paced with pacemaker). INR therapeutic on coumadin.   4. Dyspnea  No current complaints of dyspnea. CXR clear.   5. Compression fracture of thoracic vertebra  Given the patient's age of greater than 50 and the fracture site (hip, distal radius, or spine), the patient should be tested for osteoporosis using DXA, and the appropriate treatment considered based on the DXA results. Will defer testing to the patient's PCP.   6.  Generalized weakness and deconditioning. Patient was evaluated by physical therapy recommended home health physical therapy, which will be arranged at discharge.  No OT needs identified.  7.  CODE STATUS. Patient given her age she indicated that she wanted to be DO NOT RESUSCITATE/DO NOT INTUBATE.  Day of Discharge BP 162/59  Pulse 59  Temp(Src) 97.6 F (36.4 C) (Oral)  Resp 22  Ht 5\' 3"  (1.6 m)  Wt 57.698 kg (127 lb 3.2 oz)  BMI 22.53 kg/m2  SpO2 96%  Results for orders placed during the hospital encounter of 05/21/11 (from the past 48 hour(s))  PRO B NATRIURETIC PEPTIDE     Status: Abnormal   Collection Time   05/21/11  3:20 PM      Component Value Range Comment   Pro B Natriuretic peptide (BNP) 1058.0 (*) 0 - 450  (pg/mL)   CBC     Status: Abnormal   Collection Time   05/21/11  3:45 PM      Component Value Range Comment   WBC 6.6  4.0 - 10.5 (K/uL)    RBC 3.62 (*) 3.87 - 5.11 (MIL/uL)    Hemoglobin 11.2 (*) 12.0 - 15.0 (g/dL)    HCT 16.1 (*) 09.6 - 46.0 (%)    MCV 95.0  78.0 - 100.0 (fL)    MCH 30.9  26.0 - 34.0 (pg)    MCHC 32.6  30.0 - 36.0 (g/dL)    RDW 04.5  40.9 - 81.1 (%)    Platelets 232  150 - 400 (K/uL)   DIFFERENTIAL     Status: Normal   Collection Time   05/21/11  3:45 PM      Component Value Range Comment   Neutrophils Relative 60  43 - 77 (%)    Neutro Abs 4.0  1.7 - 7.7 (K/uL)    Lymphocytes Relative 30  12 - 46 (%)    Lymphs Abs 2.0  0.7 - 4.0 (K/uL)    Monocytes Relative 8  3 - 12 (%)    Monocytes Absolute 0.5  0.1 - 1.0 (K/uL)    Eosinophils Relative 3  0 - 5 (%)    Eosinophils Absolute 0.2  0.0 - 0.7 (K/uL)    Basophils Relative 0  0 - 1 (%)    Basophils Absolute 0.0  0.0 - 0.1 (K/uL)   BASIC METABOLIC PANEL     Status: Abnormal   Collection Time   05/21/11  3:45 PM      Component Value Range Comment   Sodium 140  135 - 145 (mEq/L)    Potassium 3.9  3.5 - 5.1 (mEq/L)    Chloride 106  96 - 112 (mEq/L)    CO2 23  19 - 32 (mEq/L)    Glucose, Bld 116 (*) 70 - 99 (mg/dL)    BUN 19  6 - 23 (mg/dL)    Creatinine, Ser 1.61  0.50 - 1.10 (mg/dL)    Calcium 9.6  8.4 - 10.5 (mg/dL)    GFR calc non Af Amer 76 (*) >90 (mL/min)    GFR calc Af Amer 88 (*) >90 (mL/min)   CARDIAC PANEL(CRET KIN+CKTOT+MB+TROPI)     Status: Normal   Collection Time   05/21/11  3:45 PM      Component Value Range Comment   Total CK 46  7 - 177 (U/L)    CK, MB 2.2  0.3 - 4.0 (ng/mL)    Troponin I <0.30  <0.30 (ng/mL)    Relative Index RELATIVE INDEX IS INVALID  0.0 - 2.5    PROTIME-INR     Status: Abnormal   Collection Time   05/21/11  3:45 PM      Component Value Range Comment   Prothrombin Time 25.7 (*) 11.6 - 15.2 (seconds)    INR 2.30 (*) 0.00 - 1.49    URINALYSIS, ROUTINE W REFLEX MICROSCOPIC      Status: Normal   Collection Time   05/21/11  4:04 PM      Component Value Range Comment   Color, Urine YELLOW  YELLOW     APPearance CLEAR  CLEAR     Specific Gravity, Urine 1.021  1.005 - 1.030     pH 5.5  5.0 - 8.0     Glucose, UA NEGATIVE  NEGATIVE (mg/dL)    Hgb urine dipstick NEGATIVE  NEGATIVE     Bilirubin Urine NEGATIVE  NEGATIVE     Ketones, ur NEGATIVE  NEGATIVE (mg/dL)    Protein, ur NEGATIVE  NEGATIVE (mg/dL)    Urobilinogen, UA 1.0  0.0 - 1.0 (mg/dL)    Nitrite NEGATIVE  NEGATIVE     Leukocytes, UA NEGATIVE  NEGATIVE  MICROSCOPIC NOT DONE ON URINES WITH NEGATIVE PROTEIN, BLOOD, LEUKOCYTES, NITRITE, OR GLUCOSE <1000 mg/dL.  CARDIAC PANEL(CRET KIN+CKTOT+MB+TROPI)     Status: Normal   Collection Time   05/21/11  7:07 PM      Component Value Range Comment   Total CK 45  7 - 177 (U/L)    CK, MB 1.9  0.3 - 4.0 (ng/mL)    Troponin I <0.30  <0.30 (ng/mL)    Relative Index  RELATIVE INDEX IS INVALID  0.0 - 2.5    MRSA PCR SCREENING     Status: Normal   Collection Time   05/21/11 11:12 PM      Component Value Range Comment   MRSA by PCR NEGATIVE  NEGATIVE    CARDIAC PANEL(CRET KIN+CKTOT+MB+TROPI)     Status: Normal   Collection Time   05/22/11  3:20 AM      Component Value Range Comment   Total CK 43  7 - 177 (U/L)    CK, MB 2.1  0.3 - 4.0 (ng/mL)    Troponin I <0.30  <0.30 (ng/mL)    Relative Index RELATIVE INDEX IS INVALID  0.0 - 2.5    PROTIME-INR     Status: Abnormal   Collection Time   05/22/11  3:20 AM      Component Value Range Comment   Prothrombin Time 24.3 (*) 11.6 - 15.2 (seconds)    INR 2.14 (*) 0.00 - 1.49    LIPID PANEL     Status: Normal   Collection Time   05/22/11  3:20 AM      Component Value Range Comment   Cholesterol 124  0 - 200 (mg/dL)    Triglycerides 84  <161 (mg/dL)    HDL 57  >09 (mg/dL)    Total CHOL/HDL Ratio 2.2      VLDL 17  0 - 40 (mg/dL)    LDL Cholesterol 50  0 - 99 (mg/dL)   CARDIAC PANEL(CRET KIN+CKTOT+MB+TROPI)     Status: Normal    Collection Time   05/22/11 10:46 AM      Component Value Range Comment   Total CK 45  7 - 177 (U/L)    CK, MB 1.9  0.3 - 4.0 (ng/mL)    Troponin I <0.30  <0.30 (ng/mL)    Relative Index RELATIVE INDEX IS INVALID  0.0 - 2.5    PROTIME-INR     Status: Abnormal   Collection Time   05/23/11  4:35 AM      Component Value Range Comment   Prothrombin Time 25.4 (*) 11.6 - 15.2 (seconds)    INR 2.27 (*) 0.00 - 1.49    CBC     Status: Abnormal   Collection Time   05/23/11  4:35 AM      Component Value Range Comment   WBC 5.6  4.0 - 10.5 (K/uL)    RBC 3.60 (*) 3.87 - 5.11 (MIL/uL)    Hemoglobin 11.3 (*) 12.0 - 15.0 (g/dL)    HCT 60.4 (*) 54.0 - 46.0 (%)    MCV 95.8  78.0 - 100.0 (fL)    MCH 31.4  26.0 - 34.0 (pg)    MCHC 32.8  30.0 - 36.0 (g/dL)    RDW 98.1  19.1 - 47.8 (%)    Platelets 231  150 - 400 (K/uL)   BASIC METABOLIC PANEL     Status: Abnormal   Collection Time   05/23/11  8:02 AM      Component Value Range Comment   Sodium 139  135 - 145 (mEq/L)    Potassium 3.8  3.5 - 5.1 (mEq/L)    Chloride 104  96 - 112 (mEq/L)    CO2 28  19 - 32 (mEq/L)    Glucose, Bld 109 (*) 70 - 99 (mg/dL)    BUN 14  6 - 23 (mg/dL)    Creatinine, Ser 2.95  0.50 - 1.10 (mg/dL)    Calcium 9.4  8.4 -  10.5 (mg/dL)    GFR calc non Af Amer 76 (*) >90 (mL/min)    GFR calc Af Amer 88 (*) >90 (mL/min)     Dg Chest 2 View  05/21/2011  *RADIOLOGY REPORT*  Clinical Data: Coughing, shortness of breath, and weakness  CHEST - 2 VIEW  Comparison: CT chest 07/20/2010 and chest radiograph 07/20/2010  Findings: Stable cardiomegaly and mediastinal contours. Coronary artery stent noted.  Left subclavian dual lead pacer device with leads terminating in the right atrium and right ventricle is stable.  Pulmonary vascularity is within normal limits.  The lungs are normally expanded and clear.  Negative for edema, focal airspace opacity, effusion, or pneumothorax.  The bones are osteopenic.  There is a stable compression fracture of   the mid thoracic spine vertebral body (either T6 or T7), compared to prior chest CT. Degenerative changes of both shoulders.  There is irregularity and offset of the lateral arc of the left fifth rib, suspicious for an acute or subacute fracture.  IMPRESSION:  1.  Stable cardiomegaly and dual lead pacemaker.  No acute cardiopulmonary disease identified. 2.  Question acute or subacute left fifth rib fracture. 3.  Chronic single level mid thoracic spine compression fracture.  Per CMS PQRS reporting requirements (PQRS Measure 24): Given the patient's age of greater than 50 and the fracture site (hip, distal radius, or spine), the patient should be tested for osteoporosis using DXA, and the appropriate treatment considered based on the DXA results.  Original Report Authenticated By: Britta Mccreedy, M.D.   2D ECHO on 05/22/2011  Study Conclusions - Left ventricle: Systolic function was normal. The estimated ejection fraction was in the range of 60% to 65%. Regional wall motion abnormalities cannot be excluded. There is probable basal inferior wall hypokinesis. Features are consistent with a pseudonormal left ventricular filling pattern, with concomitant abnormal relaxation and increased filling pressure (grade2 diastolic dysfunction). - Aortic valve: Mild regurgitation. - Mitral valve: Calcified annulus. Mildly thickened leaflets . Mild to moderate regurgitation. - Left atrium: The atrium was mildly dilated. - Pulmonary arteries: Systolic pressure was mildly increased. PA peak pressure: 34mm Hg (S).   Disposition: Assisted living facility with home health PT.  Diet: Heart healthy diet  Activity: Resume as tolerated per physical therapy.   Follow-up Appts: Discharge Orders    Future Orders Please Complete By Expires   Diet - low sodium heart healthy      Increase activity slowly      Discharge instructions      Comments:   Followup with Londell Moh, MD (PCP) in 1 week. Followup with Dr.  Katrinka Blazing (Cardiology) in 2 weeks. Home health RN to draw PT/INR and have results sent to Dr. Michaelle Copas office on 05/25/2011.      TESTS THAT NEED FOLLOW-UP None  Time spent on discharge, talking to the patient, and coordinating care: 35 mins.   Signed: Cristal Ford, MD 05/23/2011, 10:17 AM

## 2011-05-23 NOTE — Progress Notes (Signed)
ANTICOAGULATION CONSULT NOTE - follow up  Pharmacy Consult for Coumadin Indication: atrial fibrillation  Allergies  Allergen Reactions  . Ampicillin   . Ciprofloxin Hcl (Ciprofloxacin Hcl)   . Demerol   . Sulfa Antibiotics Rash   Vital Signs: Temp: 97.6 F (36.4 C) (01/09 0545) BP: 165/60 mmHg (01/09 1019) Pulse Rate: 60  (01/09 1019)  Labs:  Basename 05/23/11 0802 05/23/11 0435 05/22/11 1046 05/22/11 0320 05/21/11 1907 05/21/11 1545  HGB -- 11.3* -- -- -- 11.2*  HCT -- 34.5* -- -- -- 34.4*  PLT -- 231 -- -- -- 232  APTT -- -- -- -- -- --  LABPROT -- 25.4* -- 24.3* -- 25.7*  INR -- 2.27* -- 2.14* -- 2.30*  HEPARINUNFRC -- -- -- -- -- --  CREATININE 0.61 -- -- -- -- 0.60  CKTOTAL -- -- 45 43 45 --  CKMB -- -- 1.9 2.1 1.9 --  TROPONINI -- -- <0.30 <0.30 <0.30 --     Medical History: Past Medical History  Diagnosis Date  . Transient ischemic attack (TIA)   . Atrial fibrillation   . Hypertension   . Dyslipidemia   . Gastritis   . Vitamin B12 deficiency   . Peptic ulcer disease   . Osteopenia   . Macular degeneration, right eye   . Arthritis     back, shoulders  . Cough 05/21/2011    pt states cough x 2 days  . Edema     left leg  . Hypoglycemia   . Urinary incontinence   . CAD 06/15/2009  . Sinoatrial node dysfunction 06/15/2009    Medications:  Prescriptions prior to admission  Medication Sig Dispense Refill  . atorvastatin (LIPITOR) 10 MG tablet Take 10 mg by mouth every morning.        . benzocaine-menthol (CHLORAEPTIC) 6-10 MG lozenge Take 1 lozenge by mouth every 2 (two) hours as needed. For sore throat        . calcium carbonate (OS-CAL) 600 MG TABS Take 600 mg by mouth 2 (two) times daily.        . Cholecalciferol (VITAMIN D3) 1000 UNITS CAPS Take 1 capsule by mouth 2 (two) times daily.        Marland Kitchen docusate (COLACE) 50 MG/5ML liquid Place 50 mg into both ears as directed. Instill 3-5 drops into both ears at bedtime every Saturday & Sunday once a month        . Fluticasone-Salmeterol (ADVAIR DISKUS) 100-50 MCG/DOSE AEPB Inhale 1 puff into the lungs 2 (two) times daily. Rinse mouth after use.       Marland Kitchen HYDROcodone-acetaminophen (NORCO) 10-325 MG per tablet Take 1 tablet by mouth 3 (three) times daily as needed. For pain.       . Multiple Vitamin (MULITIVITAMIN WITH MINERALS) TABS Take 1 tablet by mouth daily.        Marland Kitchen omeprazole (PRILOSEC) 20 MG capsule Take 20 mg by mouth 2 (two) times daily.        . traMADol (ULTRAM-ER) 100 MG 24 hr tablet Take 100 mg by mouth daily.        . vitamin B-12 (CYANOCOBALAMIN) 1000 MCG tablet Place 1,000 mcg under the tongue daily.        Marland Kitchen DISCONTD: metoprolol (TOPROL-XL) 100 MG 24 hr tablet Take 150 mg by mouth daily.        Marland Kitchen acetaminophen (TYLENOL) 500 MG tablet Take 1,000 mg by mouth 3 (three) times daily as needed. For pain not to exceed 3  gm per day       . albuterol (VENTOLIN HFA) 108 (90 BASE) MCG/ACT inhaler Inhale 2 puffs into the lungs every 4 (four) hours as needed. For shortness of breath. Wait 1 minute between each puff.      . Multiple Vitamins-Minerals (ICAPS AREDS FORMULA PO) Take 1 tablet by mouth 2 (two) times daily.        . nitroGLYCERIN (NITROSTAT) 0.4 MG SL tablet Place 0.4 mg under the tongue every 5 (five) minutes as needed. For chest pain up to 3 doses       . Omega-3 Fatty Acids (SEA-OMEGA 30 PO) Take 1 capsule by mouth 2 (two) times daily.        . polyethylene glycol powder (GLYCOLAX/MIRALAX) powder Take 17 g by mouth daily as needed.        . warfarin (COUMADIN) 2.5 MG tablet Take 2.5 mg by mouth daily. On Monday, Wednesday, and Friday       . warfarin (COUMADIN) 5 MG tablet Take 5 mg by mouth daily. On Tuesday, Thursday, Saturday, and Sunday        Scheduled:     . dextromethorphan  30 mg Oral BID  . Fluticasone-Salmeterol  1 puff Inhalation BID  . metoprolol  150 mg Oral Daily  . nitroGLYCERIN  0.2 mg Transdermal Daily  . pantoprazole  40 mg Oral Q1200  . simvastatin  20 mg Oral QPC  supper  . warfarin  5 mg Oral ONCE-1800  . DISCONTD: furosemide  20 mg Oral Daily   Infusions:   PRN: albuterol, nitroGLYCERIN, polyethylene glycol  Assessment: 78 yo SN[facility] resident admitted with SOB and CP. On Coumadin PTA for hx Afib. INR is within therapeutic range. No bleeding reported.  Goal of Therapy:  INR 2-3   Plan:  Coumadin 2.5mg  here at [facility], if not discharged today.  Otho Bellows PharmD Pager [phone]  05/23/2011,10:30 AM.

## 2011-10-31 ENCOUNTER — Emergency Department (HOSPITAL_COMMUNITY)
Admission: EM | Admit: 2011-10-31 | Discharge: 2011-11-01 | Disposition: A | Discharge status: Home / Self Care | Payer: Medicare Other | Attending: Emergency Medicine | Admitting: Emergency Medicine

## 2011-10-31 DIAGNOSIS — Z8673 Personal history of transient ischemic attack (TIA), and cerebral infarction without residual deficits: Secondary | ICD-10-CM | POA: Insufficient documentation

## 2011-10-31 DIAGNOSIS — R059 Cough, unspecified: Secondary | ICD-10-CM | POA: Insufficient documentation

## 2011-10-31 DIAGNOSIS — I10 Essential (primary) hypertension: Secondary | ICD-10-CM | POA: Insufficient documentation

## 2011-10-31 DIAGNOSIS — R042 Hemoptysis: Secondary | ICD-10-CM

## 2011-10-31 DIAGNOSIS — Z950 Presence of cardiac pacemaker: Secondary | ICD-10-CM | POA: Insufficient documentation

## 2011-10-31 DIAGNOSIS — I4891 Unspecified atrial fibrillation: Secondary | ICD-10-CM | POA: Insufficient documentation

## 2011-10-31 DIAGNOSIS — I2510 Atherosclerotic heart disease of native coronary artery without angina pectoris: Secondary | ICD-10-CM | POA: Insufficient documentation

## 2011-10-31 DIAGNOSIS — K5900 Constipation, unspecified: Secondary | ICD-10-CM

## 2011-10-31 DIAGNOSIS — R1011 Right upper quadrant pain: Secondary | ICD-10-CM | POA: Insufficient documentation

## 2011-10-31 DIAGNOSIS — R05 Cough: Secondary | ICD-10-CM | POA: Insufficient documentation

## 2011-10-31 LAB — COMPREHENSIVE METABOLIC PANEL
Alkaline Phosphatase: 78 U/L (ref 39–117)
BUN: 26 mg/dL — ABNORMAL HIGH (ref 6–23)
CO2: 24 mEq/L (ref 19–32)
Chloride: 98 mEq/L (ref 96–112)
Creatinine, Ser: 0.79 mg/dL (ref 0.50–1.10)
GFR calc non Af Amer: 69 mL/min — ABNORMAL LOW (ref >90)
Glucose, Bld: 153 mg/dL — ABNORMAL HIGH (ref 70–99)
Potassium: 4.4 mEq/L (ref 3.5–5.1)
Total Bilirubin: 0.4 mg/dL (ref 0.3–1.2)

## 2011-10-31 LAB — DIFFERENTIAL
Lymphocytes Relative: 18 % (ref 12–46)
Lymphs Abs: 1.3 10*3/uL (ref 0.7–4.0)
Monocytes Absolute: 0.5 10*3/uL (ref 0.1–1.0)
Monocytes Relative: 7 % (ref 3–12)
Neutro Abs: 5.4 10*3/uL (ref 1.7–7.7)
Neutrophils Relative %: 72 % (ref 43–77)

## 2011-10-31 LAB — GLUCOSE, CAPILLARY
Glucose-Capillary: 159 mg/dL — ABNORMAL HIGH (ref 70–99)
Glucose-Capillary: 74 mg/dL (ref 70–99)

## 2011-10-31 LAB — CBC
HCT: 37.5 % (ref 36.0–46.0)
Hemoglobin: 12.4 g/dL (ref 12.0–15.0)
MCHC: 33.1 g/dL (ref 30.0–36.0)
RBC: 3.91 MIL/uL (ref 3.87–5.11)

## 2011-10-31 NOTE — ED Provider Notes (Signed)
History     CSN: [id number]  Arrival date & time 10/31/11  2107   First MD Initiated Contact with Patient 10/31/11 2335      Chief Complaint  Patient presents with  . Hemoptysis    Patient is a 78 y.o. female presenting with cough. The history is provided by the patient.  Cough This is a new problem. The current episode started more than 2 days ago (about 1 week). The problem has been gradually worsening. The cough is productive of blood-tinged sputum (she has coughed up cream colored sputum and then some blood tinged sputum today). There has been no fever. Associated symptoms include chest pain and sore throat. Pertinent negatives include no rhinorrhea and no shortness of breath. She is not a smoker. Her past medical history does not include COPD.  She is also having pain in the right upper abdomen.  No vomiting or diarrhea.  That has been ongoing for 4-5 days.  It is tight and uncomfortable.  She has also noticed a burning sensation in her throat and chest.  That has resolved at this point.  Past Medical History  Diagnosis Date  . Transient ischemic attack (TIA)   . Atrial fibrillation   . Hypertension   . Dyslipidemia   . Gastritis   . Vitamin B12 deficiency   . Peptic ulcer disease   . Osteopenia   . Macular degeneration, right eye   . Arthritis     back, shoulders  . Cough 05/21/2011    pt states cough x 2 days  . Edema     left leg  . Hypoglycemia   . Urinary incontinence   . CAD 06/15/2009  . Sinoatrial node dysfunction 06/15/2009    Past Surgical History  Procedure Date  . Perforated ulcer   . Eye surgery     bilateral  . Pacemaker insertion   . Hip surgery     broke hip and has pin in it  . Drug-eluting stent 2008,209    2009 to LAD  . Cardiac catheterization     No family history on file.  History  Substance Use Topics  . Smoking status: Never Smoker   . Smokeless tobacco: Never Used  . Alcohol Use: No    OB History    Grav Para Term Preterm  Abortions TAB SAB Ect Mult Living                  Review of Systems  HENT: Positive for sore throat. Negative for rhinorrhea.   Respiratory: Positive for cough. Negative for shortness of breath.   Cardiovascular: Positive for chest pain.  Gastrointestinal: Positive for constipation.  All other systems reviewed and are negative.    Allergies  Ampicillin; Ciprofloxin hcl; Demerol; and Sulfa antibiotics  Home Medications   Current Outpatient Rx  Name Route Sig Dispense Refill  . ALBUTEROL SULFATE HFA 108 (90 BASE) MCG/ACT IN AERS Inhalation Inhale 2 puffs into the lungs every 4 (four) hours as needed. For shortness of breath. Wait 1 minute between each puff.    . ATORVASTATIN CALCIUM 10 MG PO TABS Oral Take 10 mg by mouth every morning.      Marland Kitchen BENZOCAINE-MENTHOL 6-10 MG MT LOZG Oral Take 1 lozenge by mouth every 2 (two) hours as needed. For sore throat      . CALCIUM CARBONATE 600 MG PO TABS Oral Take 600 mg by mouth 2 (two) times daily.      Marland Kitchen VITAMIN D3 1000 UNITS  PO CAPS Oral Take 1 capsule by mouth 2 (two) times daily.      Marland Kitchen DOCUSATE SODIUM 50 MG/5ML PO LIQD Both Ears Place 50 mg into both ears as directed. Instill 3-5 drops into both ears at bedtime every Saturday & Sunday once a month     . ENOXAPARIN SODIUM 40 MG/0.4ML [place] SOLN Subcutaneous Inject 40 mg into the skin daily.    . FESOTERODINE FUMARATE ER 8 MG PO TB24 Oral Take 8 mg by mouth daily.    Marland Kitchen FLUTICASONE-SALMETEROL 100-50 MCG/DOSE IN AEPB Inhalation Inhale 1 puff into the lungs 2 (two) times daily. Rinse mouth after use.     . FUROSEMIDE 40 MG PO TABS Oral Take 40 mg by mouth daily.    Marland Kitchen HYDROCODONE-ACETAMINOPHEN 10-325 MG PO TABS Oral Take 1 tablet by mouth every 8 (eight) hours as needed for pain. For pain. 20 tablet 0  . METOPROLOL SUCCINATE ER 100 MG PO TB24 Oral Take 100 mg by mouth daily. Take with or immediately following a meal.    . ADULT MULTIVITAMIN W/MINERALS CH Oral Take 1 tablet by mouth daily.      .  ICAPS AREDS FORMULA PO Oral Take 1 tablet by mouth 2 (two) times daily.      Marland Kitchen NITROFURANTOIN MONOHYD MACRO 100 MG PO CAPS Oral Take 100 mg by mouth 2 (two) times daily. On BID on Wed    . NITROGLYCERIN 0.2 MG/HR TD PT24 Transdermal Place 1 patch (0.2 mg total) onto the skin daily. 30 patch 0  . NITROGLYCERIN 0.4 MG SL SUBL Sublingual Place 0.4 mg under the tongue every 5 (five) minutes as needed. For chest pain up to 3 doses     . SEA-OMEGA 30 PO Oral Take 1 capsule by mouth 2 (two) times daily.      Marland Kitchen OMEPRAZOLE 20 MG PO CPDR Oral Take 20 mg by mouth 2 (two) times daily.      Marland Kitchen POLYETHYLENE GLYCOL 3350 PO POWD Oral Take 17 g by mouth daily as needed.      Marland Kitchen POTASSIUM CHLORIDE ER 10 MEQ PO TBCR Oral Take 10 mEq by mouth 2 (two) times daily.    . TRAMADOL HCL ER 100 MG PO TB24 Oral Take 100 mg by mouth daily.      Marland Kitchen VITAMIN B-12 1000 MCG PO TABS Sublingual Place 1,000 mcg under the tongue daily.      . WARFARIN SODIUM 2.5 MG PO TABS Oral Take 2.5 mg by mouth daily. On Monday, Wednesday, and Friday    . WARFARIN SODIUM 5 MG PO TABS Oral Take 5 mg by mouth daily. On Tuesday, Thursday, Saturday, and Sunday       BP 130/60  Pulse 60  Temp 98.4 F (36.9 C) (Oral)  Resp 18  SpO2 94%  Physical Exam  Nursing note and vitals reviewed. Constitutional: No distress.  HENT:  Head: Normocephalic and atraumatic.  Right Ear: External ear normal.  Left Ear: External ear normal.  Eyes: Conjunctivae are normal. Right eye exhibits no discharge. Left eye exhibits no discharge. No scleral icterus.  Neck: Neck supple. No tracheal deviation present.  Cardiovascular: Normal rate, regular rhythm and intact distal pulses.   Pulmonary/Chest: Effort normal and breath sounds normal. No stridor. No respiratory distress. She has no wheezes. She has no rales.  Abdominal: Soft. Bowel sounds are normal. She exhibits no distension and no mass. There is tenderness (rlq). There is no rebound and no guarding.    Musculoskeletal:  She exhibits edema. She exhibits no tenderness.  Neurological: She is alert. She has normal strength. No sensory deficit. Cranial nerve deficit:  no gross defecits noted. She exhibits normal muscle tone. She displays no seizure activity. Coordination normal.  Skin: Skin is warm and dry. No rash noted.  Psychiatric: She has a normal mood and affect.    ED Course  Procedures (including critical care time)  Rate: 60  Rhythm: atrial paced  QRS Axis: normal  Intervals: normal  ST/T Wave abnormalities: Nonspecific ST-T wave changes  Conduction Disutrbances:none  Old EKG Reviewed: No significant changes  Labs Reviewed  COMPREHENSIVE METABOLIC PANEL - Abnormal; Notable for the following:    Sodium 134 (*)     Glucose, Bld 153 (*)     BUN 26 (*)     GFR calc non Af Amer 69 (*)     GFR calc Af Amer 80 (*)     All other components within normal limits  GLUCOSE, CAPILLARY - Abnormal; Notable for the following:    Glucose-Capillary 159 (*)     All other components within normal limits  PROTIME-INR - Abnormal; Notable for the following:    Prothrombin Time 23.4 (*)     INR 2.04 (*)     All other components within normal limits  URINALYSIS, ROUTINE W REFLEX MICROSCOPIC - Abnormal; Notable for the following:    Leukocytes, UA SMALL (*)     All other components within normal limits  GLUCOSE, CAPILLARY  CBC  DIFFERENTIAL  TROPONIN I  LIPASE, BLOOD  URINE MICROSCOPIC-ADD ON   Dg Chest 2 View  11/01/2011  *RADIOLOGY REPORT*  Clinical Data: Hemoptysis; atrial fibrillation.  Abdominal pain.  CHEST - 2 VIEW  Comparison: Chest radiograph performed 05/21/2011  Findings: The lungs are mildly hypoexpanded.  Minimal bibasilar atelectasis is noted.  Mild peribronchial thickening is again seen. No pleural effusion or pneumothorax is identified.  The heart is mildly enlarged; a pacemaker is noted at the left chest wall, with leads ending at the right atrium and right ventricle.  No  acute osseous abnormalities are seen.  Chronic degenerative change is noted at both glenohumeral joints. Scattered clips are seen about the gastroesophageal junction.  IMPRESSION:  1.  Lungs mildly hypoexpanded; minimal bibasilar atelectasis and mild chronic peribronchial thickening seen. 2.  Mild cardiomegaly. 3.  Chronic degenerative change at both glenohumeral joints.  Original Report Authenticated By: Tonia Ghent, M.D.   Ct Abdomen Pelvis W Contrast  11/01/2011  *RADIOLOGY REPORT*  Clinical Data: Left-sided abdominal pain; recent fall.  CT ABDOMEN AND PELVIS WITH CONTRAST  Technique:  Multidetector CT imaging of the abdomen and pelvis was performed following the standard protocol during bolus administration of intravenous contrast.  Contrast: OMNIPAQUE IOHEXOL 300 MG/ML  SOLN  Comparison: CT of the abdomen and pelvis from 11/26/2010  Findings: Minimal bibasilar atelectasis is noted.  Diffuse coronary artery calcifications are seen.  A pacemaker lead is partially imaged.  Postoperative change is noted at the gastroesophageal junction.  There is diffuse prominence of the common hepatic duct to 1.3 cm in maximal diameter, with mild intrahepatic biliary ductal dilatation and mild distension of the gallbladder.  This appears stable from the prior studies and likely reflects the patient's baseline.  Scattered hypodensities within the liver, measuring up to 2.0 cm in size, the right likely reflects cysts, and also appear grossly stable.  The liver is otherwise unremarkable in appearance.  The spleen is within normal limits.  The pancreas and adrenal  glands are grossly unremarkable.  A 6 mm nonobstructing stone is noted at the lower pole of the left kidney.  Scattered bilateral renal cysts are seen, measuring up to 2.1 cm in size.  There is no evidence of hydronephrosis; no obstructing ureteral stones are identified.  No perinephric stranding is appreciated.  No free fluid is identified.  The small bowel is  unremarkable in appearance.  The stomach is within normal limits.  No acute vascular abnormalities are seen.  Diffuse calcification is noted along the abdominal aorta and its branches, particularly along the proximal superior mesenteric artery and proximal left renal artery.  The appendix is normal in caliber and contains air, without evidence for appendicitis.  The colon is largely filled with stool and is unremarkable in appearance.  The bladder is mildly distended and is grossly unremarkable in appearance.  The uterus is grossly unremarkable.  The ovaries are relatively symmetric; no suspicious adnexal masses are seen.  No inguinal lymphadenopathy is seen.  No acute osseous abnormalities are identified.  Right hip screws are grossly unremarkable in appearance.  There is grade 1 anterolisthesis of L4 on L5.  Facet disease is noted along the lumbar spine.  There is mild diffuse osteopenia of visualized osseous structures.  IMPRESSION:  1.  Large amount of stool in the colon may reflect mild constipation. 2.  Diffuse calcification along the abdominal aorta and branches, particularly along the proximal superior mesenteric artery and proximal left renal artery. 3.  Diffuse coronary artery calcifications seen. 4.  Scattered hepatic cysts and renal cysts; 6 mm nonobstructing stone at the lower pole of the left kidney.  Original Report Authenticated By: Tonia Ghent, M.D.     1. Hemoptysis   2. Constipation       MDM  The patient has not had any hemoptysis here in the emergency department.  She has been coughing for one week. I will start her on a course of antibiotics for a possible bronchitis. Her symptoms are not suggestive of a pulmonary embolism and she is artery on anticoagulants. Regarding her abdominal pain, the CT scan demonstrates constipation.  There is no other acute abnormality to suggest a cause for her symptoms. I will discharge the patient home on a course of antibiotics and lactulose. I  instructed her to have her INR rechecked as the antibiotic because interaction with her Coumadin         Celene Kras, MD 11/01/11 [phone]

## 2011-10-31 NOTE — ED Notes (Signed)
WUJ:[id number]<JY> Expected date:<BR> Expected time: 9:08 PM<BR> Means of arrival:<BR> Comments:<BR> M212 - 89yoF Coughing blood x1 wk *TRIAGE-CAPABLE*

## 2011-10-31 NOTE — ED Notes (Signed)
CBG registered 159 on ED Glucometer post orange juice intake

## 2011-10-31 NOTE — ED Notes (Addendum)
CBG registered 74 on ED Glucometer, RN Abbie Notified, Orange juice given to patient.

## 2011-10-31 NOTE — ED Notes (Signed)
Pt states that she has had LRQ and has been coughing up blood for approx. A week. Also states she fell 6/14 and has a sore bottom. No break associated with that fall.

## 2011-11-01 ENCOUNTER — Emergency Department (HOSPITAL_COMMUNITY): Payer: Medicare Other

## 2011-11-01 LAB — PROTIME-INR: Prothrombin Time: 23.4 seconds — ABNORMAL HIGH (ref 11.6–15.2)

## 2011-11-01 LAB — URINALYSIS, ROUTINE W REFLEX MICROSCOPIC
Bilirubin Urine: NEGATIVE
Glucose, UA: NEGATIVE mg/dL
Ketones, ur: NEGATIVE mg/dL
Nitrite: NEGATIVE
Specific Gravity, Urine: 1.02 (ref 1.005–1.030)
pH: 5.5 (ref 5.0–8.0)

## 2011-11-01 LAB — URINE MICROSCOPIC-ADD ON

## 2011-11-01 LAB — LIPASE, BLOOD: Lipase: 26 U/L (ref 11–59)

## 2011-11-01 MED ORDER — AZITHROMYCIN 250 MG PO TABS: ORAL_TABLET | ORAL | Status: AC

## 2011-11-01 MED ORDER — MORPHINE SULFATE 2 MG/ML IJ SOLN
2.0000 mg | Freq: Once | INTRAMUSCULAR | Status: AC
  Administered 2011-11-01: 2 mg via INTRAVENOUS
  Filled 2011-11-01: qty 1

## 2011-11-01 MED ORDER — LACTULOSE 20 GM/30ML PO SOLN: 15.0000 mL | Freq: Two times a day (BID) | ORAL | Status: DC

## 2011-11-01 MED ORDER — IOHEXOL 300 MG/ML  SOLN
100.0000 mL | Freq: Once | INTRAMUSCULAR | Status: AC | PRN
  Administered 2011-11-01: 100 mL via INTRAVENOUS

## 2011-11-01 NOTE — Discharge Instructions (Signed)
The antibiotics may interfere with your coumadin.  Make sure to have it rechecked in the next week.  Constipation in Adults Constipation is having fewer than 2 bowel movements per week. Usually, the stools are hard. As we grow older, constipation is more common. If you try to fix constipation with laxatives, the problem may get worse. This is because laxatives taken over a long period of time make the colon muscles weaker. A low-fiber diet, not taking in enough fluids, and taking some medicines may make these problems worse. MEDICATIONS THAT MAY CAUSE CONSTIPATION  Water pills (diuretics).   Calcium channel blockers (used to control blood pressure and for the heart).   Certain pain medicines (narcotics).   Anticholinergics.   Anti-inflammatory agents.   Antacids that contain aluminum.  DISEASES THAT CONTRIBUTE TO CONSTIPATION  Diabetes.   Parkinson's disease.   Dementia.   Stroke.   Depression.   Illnesses that cause problems with salt and water metabolism.  HOME CARE INSTRUCTIONS   Constipation is usually best cared for without medicines. Increasing dietary fiber and eating more fruits and vegetables is the best way to manage constipation.   Slowly increase fiber intake to 25 to 38 grams per day. Whole grains, fruits, vegetables, and legumes are good sources of fiber. A dietitian can further help you incorporate high-fiber foods into your diet.   Drink enough water and fluids to keep your urine clear or pale yellow.   A fiber supplement may be added to your diet if you cannot get enough fiber from foods.   Increasing your activities also helps improve regularity.   Suppositories, as suggested by your caregiver, will also help. If you are using antacids, such as aluminum or calcium containing products, it will be helpful to switch to products containing magnesium if your caregiver says it is okay.   If you have been given a liquid injection (enema) today, this is only a  temporary measure. It should not be relied on for treatment of longstanding (chronic) constipation.   Stronger measures, such as magnesium sulfate, should be avoided if possible. This may cause uncontrollable diarrhea. Using magnesium sulfate may not allow you time to make it to the bathroom.  SEEK IMMEDIATE MEDICAL CARE IF:   There is bright red blood in the stool.   The constipation stays for more than 4 days.   There is belly (abdominal) or rectal pain.   You do not seem to be getting better.   You have any questions or concerns.  MAKE SURE YOU:   Understand these instructions.   Will watch your condition.   Will get help right away if you are not doing well or get worse.  Document Released: 01/27/2004 Document Revised: 04/19/2011 Document Reviewed: 04/03/2011 [facility] Patient Information 2012 [place], [place].ronchitis Bronchitis is the body's way of reacting to injury and/or infection (inflammation) of the bronchi. Bronchi are the air tubes that extend from the windpipe into the lungs. If the inflammation becomes severe, it may cause shortness of breath. CAUSES  Inflammation may be caused by:  A virus.   Germs (bacteria).   Dust.   Allergens.   Pollutants and many other irritants.  The cells lining the bronchial tree are covered with tiny hairs (cilia). These constantly beat upward, away from the lungs, toward the mouth. This keeps the lungs free of pollutants. When these cells become too irritated and are unable to do their job, mucus begins to develop. This causes the characteristic cough of bronchitis. The cough  clears the lungs when the cilia are unable to do their job. Without either of these protective mechanisms, the mucus would settle in the lungs. Then you would develop pneumonia. Smoking is a common cause of bronchitis and can contribute to pneumonia. Stopping this habit is the single most important thing you can do to help yourself. TREATMENT   Your caregiver  may prescribe an antibiotic if the cough is caused by bacteria. Also, medicines that open up your airways make it easier to breathe. Your caregiver may also recommend or prescribe an expectorant. It will loosen the mucus to be coughed up. Only take over-the-counter or prescription medicines for pain, discomfort, or fever as directed by your caregiver.   Removing whatever causes the problem (smoking, for example) is critical to preventing the problem from getting worse.   Cough suppressants may be prescribed for relief of cough symptoms.   Inhaled medicines may be prescribed to help with symptoms now and to help prevent problems from returning.   For those with recurrent (chronic) bronchitis, there may be a need for steroid medicines.  SEEK IMMEDIATE MEDICAL CARE IF:   During treatment, you develop more pus-like mucus (purulent sputum).   You have a fever.   Your baby is older than 3 months with a rectal temperature of 102 F (38.9 C) or higher.   Your baby is 38 months old or younger with a rectal temperature of 100.4 F (38 C) or higher.   You become progressively more ill.   You have increased difficulty breathing, wheezing, or shortness of breath.  It is necessary to seek immediate medical care if you are elderly or sick from any other disease. MAKE SURE YOU:   Understand these instructions.   Will watch your condition.   Will get help right away if you are not doing well or get worse.  Document Released: 04/30/2005 Document Revised: 04/19/2011 Document Reviewed: 03/09/2008 [facility] Patient Information 2012 [place], [place].

## 2011-11-01 NOTE — ED Notes (Signed)
Report called to Clydie Braun, Charity fundraiser at [facility].

## 2011-11-01 NOTE — ED Notes (Signed)
No s/s of distress noted at this time.  

## 2011-11-05 LAB — GLUCOSE, CAPILLARY: Glucose-Capillary: 67 mg/dL — ABNORMAL LOW (ref 70–99)

## 2012-01-07 ENCOUNTER — Ambulatory Visit
Admission: RE | Admit: 2012-01-07 | Discharge: 2012-01-07 | Disposition: A | Discharge status: Home / Self Care | Payer: Medicare Other | Source: Ambulatory Visit | Attending: Internal Medicine | Admitting: Internal Medicine

## 2012-01-07 ENCOUNTER — Other Ambulatory Visit: Payer: Self-pay | Admitting: Internal Medicine

## 2012-01-07 DIAGNOSIS — W19XXXA Unspecified fall, initial encounter: Secondary | ICD-10-CM

## 2012-04-03 ENCOUNTER — Observation Stay (HOSPITAL_COMMUNITY)
Admission: AD | Admit: 2012-04-03 | Discharge: 2012-04-04 | Disposition: A | Discharge status: Home / Self Care | Payer: Medicare Other | Source: Ambulatory Visit | Attending: Interventional Cardiology | Admitting: Interventional Cardiology

## 2012-04-03 ENCOUNTER — Encounter (HOSPITAL_COMMUNITY): Payer: Self-pay | Admitting: General Practice

## 2012-04-03 DIAGNOSIS — I495 Sick sinus syndrome: Secondary | ICD-10-CM | POA: Insufficient documentation

## 2012-04-03 DIAGNOSIS — I509 Heart failure, unspecified: Secondary | ICD-10-CM | POA: Insufficient documentation

## 2012-04-03 DIAGNOSIS — I10 Essential (primary) hypertension: Secondary | ICD-10-CM

## 2012-04-03 DIAGNOSIS — I5032 Chronic diastolic (congestive) heart failure: Secondary | ICD-10-CM | POA: Insufficient documentation

## 2012-04-03 DIAGNOSIS — Z9861 Coronary angioplasty status: Secondary | ICD-10-CM | POA: Insufficient documentation

## 2012-04-03 DIAGNOSIS — Z7901 Long term (current) use of anticoagulants: Secondary | ICD-10-CM

## 2012-04-03 DIAGNOSIS — J4489 Other specified chronic obstructive pulmonary disease: Secondary | ICD-10-CM | POA: Insufficient documentation

## 2012-04-03 DIAGNOSIS — K219 Gastro-esophageal reflux disease without esophagitis: Secondary | ICD-10-CM | POA: Insufficient documentation

## 2012-04-03 DIAGNOSIS — I2510 Atherosclerotic heart disease of native coronary artery without angina pectoris: Secondary | ICD-10-CM | POA: Insufficient documentation

## 2012-04-03 DIAGNOSIS — R079 Chest pain, unspecified: Principal | ICD-10-CM | POA: Insufficient documentation

## 2012-04-03 DIAGNOSIS — Z950 Presence of cardiac pacemaker: Secondary | ICD-10-CM

## 2012-04-03 DIAGNOSIS — Z8673 Personal history of transient ischemic attack (TIA), and cerebral infarction without residual deficits: Secondary | ICD-10-CM | POA: Insufficient documentation

## 2012-04-03 DIAGNOSIS — I4891 Unspecified atrial fibrillation: Secondary | ICD-10-CM | POA: Insufficient documentation

## 2012-04-03 DIAGNOSIS — J449 Chronic obstructive pulmonary disease, unspecified: Secondary | ICD-10-CM | POA: Insufficient documentation

## 2012-04-03 LAB — CBC WITH DIFFERENTIAL/PLATELET
Basophils Absolute: 0 10*3/uL (ref 0.0–0.1)
Eosinophils Absolute: 0.2 10*3/uL (ref 0.0–0.7)
Eosinophils Relative: 3 % (ref 0–5)
Lymphocytes Relative: 22 % (ref 12–46)
Lymphs Abs: 1.2 10*3/uL (ref 0.7–4.0)
MCV: 95.2 fL (ref 78.0–100.0)
Neutrophils Relative %: 65 % (ref 43–77)
Platelets: 188 10*3/uL (ref 150–400)
RBC: 3.74 MIL/uL — ABNORMAL LOW (ref 3.87–5.11)
RDW: 13.3 % (ref 11.5–15.5)
WBC: 5.3 10*3/uL (ref 4.0–10.5)

## 2012-04-03 LAB — PROTIME-INR
INR: 2.16 — ABNORMAL HIGH (ref 0.00–1.49)
Prothrombin Time: 23.2 seconds — ABNORMAL HIGH (ref 11.6–15.2)

## 2012-04-03 LAB — BASIC METABOLIC PANEL
Calcium: 9.5 mg/dL (ref 8.4–10.5)
GFR calc Af Amer: 89 mL/min — ABNORMAL LOW (ref >90)
GFR calc non Af Amer: 76 mL/min — ABNORMAL LOW (ref >90)
Glucose, Bld: 97 mg/dL (ref 70–99)
Potassium: 3.9 mEq/L (ref 3.5–5.1)
Sodium: 143 mEq/L (ref 135–145)

## 2012-04-03 LAB — PRO B NATRIURETIC PEPTIDE: Pro B Natriuretic peptide (BNP): 842.9 pg/mL — ABNORMAL HIGH (ref 0–450)

## 2012-04-03 MED ORDER — NITROGLYCERIN 0.4 MG SL SUBL: 0.4000 mg | SUBLINGUAL_TABLET | SUBLINGUAL | Status: DC | PRN

## 2012-04-03 MED ORDER — ACETAMINOPHEN 325 MG PO TABS: 650.0000 mg | ORAL_TABLET | ORAL | Status: DC | PRN

## 2012-04-03 MED ORDER — GI COCKTAIL ~~LOC~~
30.0000 mL | Freq: Once | ORAL | Status: AC
  Administered 2012-04-03: 30 mL via ORAL
  Filled 2012-04-03: qty 30

## 2012-04-03 MED ORDER — FESOTERODINE FUMARATE ER 8 MG PO TB24
8.0000 mg | ORAL_TABLET | Freq: Every day | ORAL | Status: DC
  Administered 2012-04-03 – 2012-04-04 (×2): 8 mg via ORAL
  Filled 2012-04-03 (×2): qty 1

## 2012-04-03 MED ORDER — METOPROLOL SUCCINATE ER 100 MG PO TB24
100.0000 mg | ORAL_TABLET | Freq: Every day | ORAL | Status: DC
  Administered 2012-04-03 – 2012-04-04 (×2): 100 mg via ORAL
  Filled 2012-04-03 (×2): qty 1

## 2012-04-03 MED ORDER — ONDANSETRON HCL 4 MG/2ML IJ SOLN: 4.0000 mg | Freq: Four times a day (QID) | INTRAMUSCULAR | Status: DC | PRN

## 2012-04-03 MED ORDER — ASPIRIN EC 81 MG PO TBEC: 81.0000 mg | DELAYED_RELEASE_TABLET | Freq: Every day | ORAL | Status: DC

## 2012-04-03 MED ORDER — PANTOPRAZOLE SODIUM 40 MG PO TBEC
40.0000 mg | DELAYED_RELEASE_TABLET | Freq: Every day | ORAL | Status: DC
  Administered 2012-04-03 – 2012-04-04 (×2): 40 mg via ORAL
  Filled 2012-04-03 (×2): qty 1

## 2012-04-03 MED ORDER — WARFARIN SODIUM 2.5 MG PO TABS
2.5000 mg | ORAL_TABLET | ORAL | Status: DC
  Administered 2012-04-03: 2.5 mg via ORAL
  Filled 2012-04-03: qty 1

## 2012-04-03 MED ORDER — POLYETHYLENE GLYCOL 3350 17 GM/SCOOP PO POWD
17.0000 g | Freq: Every day | ORAL | Status: DC | PRN
  Filled 2012-04-03: qty 255

## 2012-04-03 MED ORDER — NITROGLYCERIN 0.2 MG/HR TD PT24
0.2000 mg | MEDICATED_PATCH | Freq: Every day | TRANSDERMAL | Status: DC
  Administered 2012-04-03 – 2012-04-04 (×2): 0.2 mg via TRANSDERMAL
  Filled 2012-04-03 (×2): qty 1

## 2012-04-03 MED ORDER — SODIUM CHLORIDE 0.9 % IJ SOLN
3.0000 mL | Freq: Two times a day (BID) | INTRAMUSCULAR | Status: DC
  Administered 2012-04-03 (×2): 3 mL via INTRAVENOUS

## 2012-04-03 MED ORDER — CALCIUM CARBONATE 1250 (500 CA) MG PO TABS
1.0000 | ORAL_TABLET | Freq: Two times a day (BID) | ORAL | Status: DC
  Administered 2012-04-03 – 2012-04-04 (×2): 500 mg via ORAL
  Filled 2012-04-03 (×3): qty 1

## 2012-04-03 MED ORDER — WARFARIN - PHARMACIST DOSING INPATIENT: Freq: Every day | Status: DC

## 2012-04-03 MED ORDER — POTASSIUM CHLORIDE CRYS ER 10 MEQ PO TBCR
10.0000 meq | EXTENDED_RELEASE_TABLET | Freq: Two times a day (BID) | ORAL | Status: DC
  Administered 2012-04-03 – 2012-04-04 (×3): 10 meq via ORAL
  Filled 2012-04-03 (×4): qty 1

## 2012-04-03 MED ORDER — HYDROCODONE-ACETAMINOPHEN 10-325 MG PO TABS: 1.0000 | ORAL_TABLET | Freq: Four times a day (QID) | ORAL | Status: DC | PRN

## 2012-04-03 MED ORDER — ATORVASTATIN CALCIUM 10 MG PO TABS
10.0000 mg | ORAL_TABLET | Freq: Every day | ORAL | Status: DC
  Administered 2012-04-03: 10 mg via ORAL
  Filled 2012-04-03 (×2): qty 1

## 2012-04-03 MED ORDER — SODIUM CHLORIDE 0.9 % IV SOLN: 250.0000 mL | INTRAVENOUS | Status: DC | PRN

## 2012-04-03 MED ORDER — BENZOCAINE-MENTHOL 6-10 MG MT LOZG
1.0000 | LOZENGE | OROMUCOSAL | Status: DC | PRN
  Filled 2012-04-03: qty 18

## 2012-04-03 MED ORDER — SODIUM CHLORIDE 0.9 % IJ SOLN: 3.0000 mL | INTRAMUSCULAR | Status: DC | PRN

## 2012-04-03 MED ORDER — DOCUSATE SODIUM 50 MG/5ML PO LIQD
50.0000 mg | Freq: Every day | ORAL | Status: DC | PRN
  Filled 2012-04-03: qty 10

## 2012-04-03 MED ORDER — VITAMIN D 1000 UNITS PO TABS
1000.0000 [IU] | ORAL_TABLET | Freq: Two times a day (BID) | ORAL | Status: DC
  Administered 2012-04-03 – 2012-04-04 (×3): 1000 [IU] via ORAL
  Filled 2012-04-03 (×4): qty 1

## 2012-04-03 MED ORDER — NITROFURANTOIN MONOHYD MACRO 100 MG PO CAPS
100.0000 mg | ORAL_CAPSULE | Freq: Two times a day (BID) | ORAL | Status: DC
  Administered 2012-04-03 – 2012-04-04 (×3): 100 mg via ORAL
  Filled 2012-04-03 (×5): qty 1

## 2012-04-03 MED ORDER — ADULT MULTIVITAMIN W/MINERALS CH
1.0000 | ORAL_TABLET | Freq: Every day | ORAL | Status: DC
  Administered 2012-04-03 – 2012-04-04 (×2): 1 via ORAL
  Filled 2012-04-03 (×2): qty 1

## 2012-04-03 MED ORDER — ASPIRIN 81 MG PO CHEW
324.0000 mg | CHEWABLE_TABLET | ORAL | Status: AC
  Administered 2012-04-03: 324 mg via ORAL
  Filled 2012-04-03: qty 4

## 2012-04-03 MED ORDER — CALCIUM CARBONATE 600 MG PO TABS
600.0000 mg | ORAL_TABLET | Freq: Two times a day (BID) | ORAL | Status: DC
  Filled 2012-04-03 (×2): qty 1

## 2012-04-03 MED ORDER — POLYETHYLENE GLYCOL 3350 17 G PO PACK
17.0000 g | PACK | Freq: Every day | ORAL | Status: DC | PRN
  Filled 2012-04-03: qty 1

## 2012-04-03 MED ORDER — ALUM & MAG HYDROXIDE-SIMETH 200-200-20 MG/5ML PO SUSP
30.0000 mL | ORAL | Status: DC | PRN
Start: 2012-04-03 — End: 2012-04-04
  Administered 2012-04-03: 30 mL via ORAL
  Filled 2012-04-03: qty 30

## 2012-04-03 MED ORDER — VITAMIN B-12 1000 MCG PO TABS
1000.0000 ug | ORAL_TABLET | Freq: Every day | ORAL | Status: DC
  Administered 2012-04-03 – 2012-04-04 (×2): 1000 ug via ORAL
  Filled 2012-04-03 (×2): qty 1

## 2012-04-03 MED ORDER — WARFARIN SODIUM 5 MG PO TABS
5.0000 mg | ORAL_TABLET | ORAL | Status: DC
  Filled 2012-04-03: qty 1

## 2012-04-03 MED ORDER — ALBUTEROL SULFATE HFA 108 (90 BASE) MCG/ACT IN AERS
2.0000 | INHALATION_SPRAY | RESPIRATORY_TRACT | Status: DC | PRN
  Filled 2012-04-03: qty 6.7

## 2012-04-03 MED ORDER — MOMETASONE FURO-FORMOTEROL FUM 100-5 MCG/ACT IN AERO
2.0000 | INHALATION_SPRAY | Freq: Two times a day (BID) | RESPIRATORY_TRACT | Status: DC
  Administered 2012-04-03: 2 via RESPIRATORY_TRACT
  Filled 2012-04-03: qty 8.8

## 2012-04-03 MED ORDER — FUROSEMIDE 40 MG PO TABS
40.0000 mg | ORAL_TABLET | Freq: Every day | ORAL | Status: DC
  Administered 2012-04-03: 40 mg via ORAL
  Filled 2012-04-03 (×2): qty 1

## 2012-04-03 MED ORDER — ASPIRIN 300 MG RE SUPP
300.0000 mg | RECTAL | Status: AC
  Filled 2012-04-03: qty 1

## 2012-04-03 NOTE — Progress Notes (Signed)
ANTICOAGULATION CONSULT NOTE - Initial Consult  Pharmacy Consult for Coumadin Indication: atrial fibrillation  Allergies  Allergen Reactions  . Ampicillin     Unknown   . Ciprofloxin Hcl (Ciprofloxacin Hcl)     Unknown   . Demerol     Unknown   . Sulfa Antibiotics Rash    Patient Measurements:    Vital Signs: Temp: 98.5 F (36.9 C) (11/21 1130) Temp src: Oral (11/21 1130) BP: 151/75 mmHg (11/21 1130) Pulse Rate: 61  (11/21 1130)  Labs:  Basename 04/03/12 1150 04/03/12 1149  HGB 11.8* --  HCT 35.6* --  PLT 188 --  APTT -- --  LABPROT 23.2* --  INR 2.16* --  HEPARINUNFRC -- --  CREATININE 0.58 --  CKTOTAL -- --  CKMB -- --  TROPONINI -- <0.30    The CrCl is unknown because both a height and weight (above a minimum accepted value) are required for this calculation.   Medical History: Past Medical History  Diagnosis Date  . Transient ischemic attack (TIA)   . Atrial fibrillation   . Hypertension   . Dyslipidemia   . Gastritis   . Vitamin B12 deficiency   . Peptic ulcer disease   . Osteopenia   . Macular degeneration, right eye   . Arthritis     back, shoulders  . Cough 05/21/2011    pt states cough x 2 days  . Edema     left leg  . Hypoglycemia   . Urinary incontinence   . CAD 06/15/2009  . Sinoatrial node dysfunction 06/15/2009    Medications:  Await home med reconciliation  Assessment: 78 y.o. female presents with prolonged chest pain. Pt on coumadin PTA for PAF. INR 2.16 (therapeutic) at baseline. Last dose yesterday.  Dose at NH: 2.5mg  on Tues/Thur/Sun and 5mg  on Mon/Wed/Fri/Sat  Goal of Therapy:  INR 2-3 Monitor platelets by anticoagulation protocol: Yes   Plan:  1. Continue home dose of coumadin as above 2. Daily INR  Christoper Fabian, PharmD, BCPS Clinical pharmacist, pager [phone] 04/03/2012,1:04 PM

## 2012-04-03 NOTE — Progress Notes (Signed)
Pt arrived to unit from MD office. VSS, pt comfortable and resting. Will continue to monitor.

## 2012-04-03 NOTE — H&P (Addendum)
[name] is a 78 y.o. female  04/03/2012 Chief complaint / reason for admission:  Prolonged chest pain    Lesleigh Noe 04/03/2012 11:06 AM       Patient: [name] Provider: Verdis Prime, MD  DOB: 01-10-1916 Age: 7 Y Sex: Female Date: 04/03/2012  Phone: [phone]   Address: [address], [place], [id number]  Pcp: Merri Brunette, MD        Subjective:     CC:    1. HS/CHEST PAIN/PER DR. Katrinka Blazing.        HPI:  General:  Mrs. Vanwagoner awakened this morning feeling relatively well but then developed nausea. She did not vomit. She'll thereafter she developed a pressure sensation in the chest. This discomfort has persisted despite resolution of the nausea and 2 sublingual nitroglycerin tablets. She denies dyspnea, palpitations, neurological complaints, and cough/hemoptysis..        ROS:  CARDIOLOGY:  Patient complains of chest tightness.. no Chest tightness. no Claudication. no Cyanosis. no Dyspnea on exertion. no Edema. no Fatigue. no Irregular heart beat. no Murmurs. no Near Syncope. no Orthopnea. no Palpitations. no PND (paroxsymal nocturnal dyspnea). Signs of GI bleeding None. Snoring and/or insomnia None. Syncope no. Transient neurological symptoms None. Visual changes none.  GASTROENTEROLOGY:  Patient complains of Nausea has resolved and there is no chest pain..         Medical History: Coronary artery disease with LAD and RCA BMS, 2008 and LAD DES 2009 for restenosis. Both vessels patent by cath 03/18/09., Hypertension, Paroxysmal atrial fibrillation/Sinus node dysfunction treated with DDD pacer11/05/10, Transient ischemic attack, 1994, Asthma/ COPD, GERD with reflux, Irritable bowel.        Surgical History: Vein stripping , Perforated peptic ulcer , Right hip fracture .        Family History: Father: deceased 62 yrs sudden death Mother: deceased Brother 1: Alzheimer's        Social History:  General:  History of smoking   cigarettes: Former smoker Alcohol: yes, Rare.  Occupation: unemployed, retired.  Marital Status: single, widowed.  Children: Yes.  Miscellaneous: Lives in assisted living facility..        Medications: Nitroglycerin 0.4 MG/HR Patch 24 Hour as directed , Nitroglycerin 0.4 mg 0.4 mg tablet 1 tablet as directed as directed prn chest pain, Furosemide 40 MG Tablet 1 tablet Once a day, Lipitor 10 MG Tablet 1 tablet Once a day, Coumadin 5 MG Tablet as directed 1 5mg  tab Mon, Wed, Fri and Saturday and 2.5 mg tablet the rest of the days, K-Dur 10 meq Tablet 1 daily, Calcium Carbonate 600 MG Tablet 1 tablet twice a day, Vitamin B12 1000 mcg Tablet 1 tablet Once a day, Delsym 30 MG/5ML Liquid Extended Release 5 ml every 6 hours, Nitrofurantoin-Macrobid 100 mg 5d 100 mg Capsule one capsule once a day, Advair Diskus 100-50 MCG/DOSE Aerosol Powder Breath Activated 1 puff every 12 hrs, Vitamin D3 1000 units Capsule twice a day, MiraLax Powder as directed , Prilosec 20 MG Capsule Delayed Release 1 capsule twice a day, Ventolin HFA 108 (90 Base) MCG/ACT Aerosol Solution 2 puffs as needed , Tylenol Extra Strength 500 MG Tablet as directed as needed, Chloraseptic 6-10 MG Lozenge 1 lozenge as needed every 2 hrs, ICaps Capsule 1 capsule twice a day, Certa-Vite Liquid 1 tablet Once a day, Ultram 50 MG Tablet 1 tablet as needed for pain every 6 hrs, Enulose 10 GM/15ML Solution 30 ml twice a day as needed,  Metoprolol Succinate 100 MG Tablet Extended Release 24 Hour 1 tablet Once a day, Fish Oil 1000 MG Capsule 1 capsule with a meal twice a day, OTC colace liquid ear drops as directed as directed , Tramadol ER 100 mg #30 ER 100 mg Tablets as directed as needed, Medication List reviewed and reconciled with the patient       Allergies: sulfa, Ampicillin, Demerol, Cipro.       Objective:     Vitals: Wt 125, Pulse sitting 64, BP sitting 150/82.       Examination:  Cardiology Exam:  GENERAL APPEARANCE: pleasant, NAD,  comfortable, elderly, female.  HEENT: normal.  CAROTID UPSTROKE: no bruit, upstrokes intact.  JVD: flat.  HEART: regular rate and rhythm, normal S1S2, no rub, no gallop, or click.  LUNGS: clear to auscultation, no wheezing/rhonchi/rales.  ABDOMEN: soft, non-tender, no hepatomegaly, no masses palpated.  EXTREMITIES: Left > right lower extremity edema with stasis dermatitis. Left leg less swollen anddermatitis improved.Marland Kitchen  PERIPHERAL PULSES: 2+, bilateral.  NEUROLOGIC: grossly intact, cranial nerves intact, gait WNL.  MOOD: normal.           Assessment:  1. Chest pain - 786.50 (Primary), prolonged ischemic quality pain with no evidence of ischemia by ECG  2. Coronary atherosclerosis of native coronary artery - 414.01  3. Sinoatrial node dysfunction - 427.81  4. Anticoagulant monitoring - V58.61  5. Chronic diastolic congestive heart failure - 428.32  6. Leg edema, left - 782.3    Plan:     1. Chest pain Start Nitroglycerin Patch 24 Hour, 0.4 MG/HR, as directed, Transdermal .  LAB: CK, MB ([id number]) LAB: Troponin I ([id number]) Diagnostic Imaging:EKG No acute abnormality, Boehler,Eileen 04/03/2012 10:24:36 AM > Brenlynn Fake 04/03/2012 10:41:10 AM > no change from prior  Obsevation to r/o MI and manage chest pain. [address].        Immunizations:        Labs:        Procedure Codes: 95621 EKG I AND R       Preventive:        Provider: Verdis Prime, MD  Patient: [name] DOB: 29-Apr-1916 Date: 04/03/2012   Lesleigh Noe

## 2012-04-03 NOTE — Progress Notes (Signed)
Pt c/o SOB. Pt repositioned with HOB elevated to 90 degrees. 02 sats remained stable at 94%, lungs clear.

## 2012-04-04 LAB — PROTIME-INR: Prothrombin Time: 22.8 seconds — ABNORMAL HIGH (ref 11.6–15.2)

## 2012-04-04 NOTE — Discharge Summary (Signed)
Patient ID: TYYANA THUESEN MRN: [id number] DOB/AGE: 76-Jan-1917 76 y.o.  Admit date: 04/03/2012 Discharge date: 04/04/2012  Primary Discharge Diagnosis:  Prolonged chest pain, with myocardial infarction ruled out  Secondary Discharge Diagnosis:  1. Coronary artery disease with prior coronary stenting  2. Paroxysmal atrial fibrillation  3. Tachybradycardia syndrome, DDD pacemaker therapy  4. Diastolic heart failure, chronic  5. Gastroesophageal reflux  6. Elderly and frail but independent  7. Chronic anticoagulation with Coumadin  Significant Diagnostic Studies: None  Consults: none  Hospital Course:  The patient developed 7/10 intensity precordial chest discomfort on the morning of admission. Sublingual nitroglycerin and a GI cocktail led to eventual relief after several hours. She slept well during the night without recurrence of chest discomfort. EKGs and cardiac markers did not reveal evidence of infarction. Given her age and frailty no further workup was felt prudent. Medications were not adjusted. The nitroglycerin that she has at home was outdated. A new prescription was written.   Discharge Exam: Blood pressure 131/70, pulse 60, temperature 97.6 F (36.4 C), temperature source Axillary, resp. rate 16, height 5\' 2"  (1.575 m), weight 56.7 kg (125 lb), SpO2 94.00%.    Appears comfortable.  Chest is clear.  Cardiac exam reveals no rub.  Left greater than right lower extremity edema, chronic and unchanged. Labs:   Lab Results  Component Value Date   WBC 5.3 04/03/2012   HGB 11.8* 04/03/2012   HCT 35.6* 04/03/2012   MCV 95.2 04/03/2012   PLT 188 04/03/2012    Lab 04/03/12 1150  NA 143  K 3.9  CL 107  CO2 29  BUN 16  CREATININE 0.58  CALCIUM 9.5  PROT --  BILITOT --  ALKPHOS --  ALT --  AST --  GLUCOSE 97   Lab Results  Component Value Date   CKTOTAL 45 05/22/2011   CKMB 1.9 05/22/2011   TROPONINI <0.30 04/03/2012    Lab Results  Component  Value Date   CHOL 124 05/22/2011   CHOL  Value: 131        ATP III CLASSIFICATION:  <200     mg/dL   Desirable  478-295  mg/dL   Borderline High  >=621    mg/dL   High 30/12/6576   CHOL  Value: 174        ATP III CLASSIFICATION:  <200     mg/dL   Desirable  469-629  mg/dL   Borderline High  >=528    mg/dL   High 08/25/2438   Lab Results  Component Value Date   HDL 57 05/22/2011   HDL 81 02/13/2008   HDL 93 05/16/7251   Lab Results  Component Value Date   LDLCALC 50 05/22/2011   LDLCALC  Value: 39        Total Cholesterol/HDL:CHD Risk Coronary Heart Disease Risk Table                     Men   Women  1/2 Average Risk   3.4   3.3 02/13/2008   LDLCALC  Value: 70        Total Cholesterol/HDL:CHD Risk Coronary Heart Disease Risk Table                     Men   Women  1/2 Average Risk   3.4   3.3 07/05/2007   Lab Results  Component Value Date   TRIG 84 05/22/2011   TRIG 55 02/13/2008  TRIG 54 07/05/2007   Lab Results  Component Value Date   CHOLHDL 2.2 05/22/2011   CHOLHDL 1.6 02/13/2008   CHOLHDL 1.9 07/05/2007   No results found for this basename: LDLDIRECT      Radiology: No acute abnormality.  EKG: A pacing with no ST abnormality or evolution of infarction.  FOLLOW UP PLANS AND APPOINTMENTS    Medication List     As of 04/04/2012  8:37 AM    TAKE these medications         acetaminophen 500 MG tablet   Commonly known as: TYLENOL   Take 1,000 mg by mouth 3 (three) times daily as needed. For pain      ADVAIR DISKUS 100-50 MCG/DOSE Aepb   Generic drug: Fluticasone-Salmeterol   Inhale 1 puff into the lungs 2 (two) times daily. Rinse mouth after use.      atorvastatin 10 MG tablet   Commonly known as: LIPITOR   Take 10 mg by mouth every morning.      benzocaine-menthol 6-10 MG lozenge   Commonly known as: CHLORAEPTIC   Take 1 lozenge by mouth every 2 (two) hours as needed. For sore throat      calcium carbonate 600 MG Tabs   Commonly known as: OS-CAL   Take 600 mg by mouth 2 (two)  times daily.      docusate 50 MG/5ML liquid   Commonly known as: COLACE   Place 50 mg into both ears as directed. Instill 3-5 drops into both ears at bedtime every Saturday & Sunday once a month      furosemide 40 MG tablet   Commonly known as: LASIX   Take 40 mg by mouth daily.      ICAPS AREDS FORMULA PO   Take 1 tablet by mouth 2 (two) times daily.      lactulose 10 GM/15ML solution   Commonly known as: CHRONULAC   Take 10 g by mouth 2 (two) times daily as needed. For constipation      metoprolol succinate 100 MG 24 hr tablet   Commonly known as: TOPROL-XL   Take 100 mg by mouth daily. Take with or immediately following a meal.      multivitamin with minerals Tabs   Take 1 tablet by mouth daily.      nitrofurantoin (macrocrystal-monohydrate) 100 MG capsule   Commonly known as: MACROBID   Take 100 mg by mouth at bedtime.      nitroGLYCERIN 0.4 MG SL tablet   Commonly known as: NITROSTAT   Place 0.4 mg under the tongue every 5 (five) minutes as needed. For chest pain up to 3 doses      nitroGLYCERIN 0.4 mg/hr   Commonly known as: NITRODUR - Dosed in mg/24 hr   Place 1 patch onto the skin daily.      omeprazole 20 MG capsule   Commonly known as: PRILOSEC   Take 20 mg by mouth 2 (two) times daily.      polyethylene glycol packet   Commonly known as: MIRALAX / GLYCOLAX   Take 17 g by mouth daily.      potassium chloride 10 MEQ tablet   Commonly known as: K-DUR,KLOR-CON   Take 10 mEq by mouth daily.      SEA-OMEGA 30 PO   Take 1 capsule by mouth 2 (two) times daily.      traMADol 100 MG 24 hr tablet   Commonly known as: ULTRAM-ER   Take 100 mg by mouth  daily.      traMADol 50 MG tablet   Commonly known as: ULTRAM   Take 50 mg by mouth See admin instructions. Take 1 tablet every morning (scheduled). May take another tablet later in the day if additional pain relief is needed.      VENTOLIN HFA 108 (90 BASE) MCG/ACT inhaler   Generic drug: albuterol   Inhale 2  puffs into the lungs every 4 (four) hours as needed. For shortness of breath. Wait 1 minute between each puff.      vitamin B-12 1000 MCG tablet   Commonly known as: CYANOCOBALAMIN   Place 1,000 mcg under the tongue daily.      Vitamin D3 1000 UNITS Caps   Take 1 capsule by mouth 2 (two) times daily.      warfarin 2.5 MG tablet   Commonly known as: COUMADIN   Take 2.5 mg by mouth See admin instructions. Take 2.5mg  on Tues, Wed, Thurs, and Sun.      warfarin 5 MG tablet   Commonly known as: COUMADIN   Take 5 mg by mouth See admin instructions. Take 5mg  Mon, Fri, and Sat           Follow-up Information    Follow up with Lesleigh Noe, MD. (As needed)    Contact information:   [address] [place] [address] [phone]          BRING ALL MEDICATIONS WITH YOU TO FOLLOW UP APPOINTMENTS  Time spent with patient to include physician time: 20 minutes  Signed: Lesleigh Noe 04/04/2012, 8:37 AM

## 2012-04-04 NOTE — Clinical Social Work Psychosocial (Signed)
     Clinical Social Work Department BRIEF PSYCHOSOCIAL ASSESSMENT 04/04/2012  Patient:  [name], [name]     Account Number:  [id number]     Admit date:  04/03/2012  Clinical Social Worker:  Margaree Mackintosh  Date/Time:  04/04/2012 10:15 AM  Referred by:  RN  Date Referred:  04/04/2012 Referred for  ALF Placement   Other Referral:   Interview type:  Family Other interview type:    PSYCHOSOCIAL DATA Living Status:  FACILITY Admitted from facility:  [facility] Level of care:  Assisted Living Primary support name:  HYQM:[id number] Primary support relationship to patient:  CHILD, ADULT Degree of support available:    CURRENT CONCERNS Current Concerns  Post-Acute Placement   Other Concerns:    SOCIAL WORK ASSESSMENT / PLAN Clinical Social Worker recieved notification from RN that pt is from [facility] and is ready to return to facility. Pt has been in hospital for less than 24 hours. CSW phoned facility to alert them of dc.  Facility does not require any additional paperwork from the dc packet.  CSW to sign off at this time, please re consult if needed.   Assessment/plan status:  Information/Referral to [facility] Other assessment/ plan:   Information/referral to community resources:   ALF.    PATIENTS/FAMILYS RESPONSE TO PLAN OF CARE: Pt and son agreeable to return to ALF.

## 2012-04-04 NOTE — Progress Notes (Signed)
ANTICOAGULATION CONSULT NOTE - Follow-Up Consult  Pharmacy Consult for Coumadin Indication: atrial fibrillation  Allergies  Allergen Reactions  . Ampicillin Other (See Comments)    Reaction Unknown   . Ciprofloxin Hcl (Ciprofloxacin Hcl) Other (See Comments)    Reaction Unknown   . Demerol Other (See Comments)    Reaction Unknown   . Sulfa Antibiotics Rash    Patient Measurements: Height: 5\' 2"  (157.5 cm) Weight: 125 lb (56.7 kg) IBW/kg (Calculated) : 50.1   Vital Signs: Temp: 97.6 F (36.4 C) (11/22 0700) Temp src: Axillary (11/22 0700) BP: 131/70 mmHg (11/22 0700) Pulse Rate: 60  (11/22 0700)  Labs:  Basename 04/04/12 0445 04/03/12 2350 04/03/12 1811 04/03/12 1150 04/03/12 1149  HGB -- -- -- 11.8* --  HCT -- -- -- 35.6* --  PLT -- -- -- 188 --  APTT -- -- -- -- --  LABPROT 22.8* -- -- 23.2* --  INR 2.11* -- -- 2.16* --  HEPARINUNFRC -- -- -- -- --  CREATININE -- -- -- 0.58 --  CKTOTAL -- -- -- -- --  CKMB -- -- -- -- --  TROPONINI -- <0.30 <0.30 -- <0.30    Estimated Creatinine Clearance: 34 ml/min (by C-G formula based on Cr of 0.58).  Assessment: 78 y.o. female presents with prolonged chest pain. Pt on coumadin PTA for PAF. INR remains therapeutic on home dose.  Dose at NH: 2.5mg  on Tues/Thur/Sun and 5mg  on Mon/Wed/Fri/Sat  Goal of Therapy:  INR 2-3 Monitor platelets by anticoagulation protocol: Yes   Plan:  1. Continue home dose of coumadin as above 2. Daily INR  Christoper Fabian, PharmD, BCPS Clinical pharmacist, pager [phone] 04/04/2012,8:45 AM

## 2012-04-04 NOTE — Progress Notes (Signed)
Utilization review completed.  

## 2012-04-30 ENCOUNTER — Other Ambulatory Visit: Payer: Self-pay | Admitting: Internal Medicine

## 2012-04-30 DIAGNOSIS — R1013 Epigastric pain: Secondary | ICD-10-CM

## 2012-05-08 ENCOUNTER — Ambulatory Visit
Admission: RE | Admit: 2012-05-08 | Discharge: 2012-05-08 | Disposition: A | Discharge status: Home / Self Care | Payer: Medicare Other | Source: Ambulatory Visit | Attending: Internal Medicine | Admitting: Internal Medicine

## 2012-05-08 DIAGNOSIS — R1013 Epigastric pain: Secondary | ICD-10-CM

## 2012-08-25 ENCOUNTER — Emergency Department (HOSPITAL_COMMUNITY)
Admission: EM | Admit: 2012-08-25 | Discharge: 2012-08-25 | Disposition: A | Discharge status: Home / Self Care | Payer: Medicare Other | Attending: Emergency Medicine | Admitting: Emergency Medicine

## 2012-08-25 ENCOUNTER — Emergency Department (HOSPITAL_COMMUNITY): Payer: Medicare Other

## 2012-08-25 ENCOUNTER — Encounter (HOSPITAL_COMMUNITY): Payer: Self-pay | Admitting: *Deleted

## 2012-08-25 DIAGNOSIS — Z7901 Long term (current) use of anticoagulants: Secondary | ICD-10-CM | POA: Insufficient documentation

## 2012-08-25 DIAGNOSIS — R630 Anorexia: Secondary | ICD-10-CM | POA: Insufficient documentation

## 2012-08-25 DIAGNOSIS — Z8639 Personal history of other endocrine, nutritional and metabolic disease: Secondary | ICD-10-CM | POA: Insufficient documentation

## 2012-08-25 DIAGNOSIS — Z79899 Other long term (current) drug therapy: Secondary | ICD-10-CM | POA: Insufficient documentation

## 2012-08-25 DIAGNOSIS — R110 Nausea: Secondary | ICD-10-CM | POA: Insufficient documentation

## 2012-08-25 DIAGNOSIS — K5900 Constipation, unspecified: Secondary | ICD-10-CM | POA: Insufficient documentation

## 2012-08-25 DIAGNOSIS — R0602 Shortness of breath: Secondary | ICD-10-CM | POA: Insufficient documentation

## 2012-08-25 DIAGNOSIS — M129 Arthropathy, unspecified: Secondary | ICD-10-CM | POA: Insufficient documentation

## 2012-08-25 DIAGNOSIS — Z862 Personal history of diseases of the blood and blood-forming organs and certain disorders involving the immune mechanism: Secondary | ICD-10-CM | POA: Insufficient documentation

## 2012-08-25 DIAGNOSIS — Z9861 Coronary angioplasty status: Secondary | ICD-10-CM | POA: Insufficient documentation

## 2012-08-25 DIAGNOSIS — M899 Disorder of bone, unspecified: Secondary | ICD-10-CM | POA: Insufficient documentation

## 2012-08-25 DIAGNOSIS — E538 Deficiency of other specified B group vitamins: Secondary | ICD-10-CM | POA: Insufficient documentation

## 2012-08-25 DIAGNOSIS — G8929 Other chronic pain: Secondary | ICD-10-CM | POA: Insufficient documentation

## 2012-08-25 DIAGNOSIS — E785 Hyperlipidemia, unspecified: Secondary | ICD-10-CM | POA: Insufficient documentation

## 2012-08-25 DIAGNOSIS — Z87891 Personal history of nicotine dependence: Secondary | ICD-10-CM | POA: Insufficient documentation

## 2012-08-25 DIAGNOSIS — Z8719 Personal history of other diseases of the digestive system: Secondary | ICD-10-CM | POA: Insufficient documentation

## 2012-08-25 DIAGNOSIS — R079 Chest pain, unspecified: Secondary | ICD-10-CM | POA: Insufficient documentation

## 2012-08-25 DIAGNOSIS — I2510 Atherosclerotic heart disease of native coronary artery without angina pectoris: Secondary | ICD-10-CM | POA: Insufficient documentation

## 2012-08-25 DIAGNOSIS — R109 Unspecified abdominal pain: Secondary | ICD-10-CM | POA: Insufficient documentation

## 2012-08-25 DIAGNOSIS — I509 Heart failure, unspecified: Secondary | ICD-10-CM | POA: Insufficient documentation

## 2012-08-25 DIAGNOSIS — Z8711 Personal history of peptic ulcer disease: Secondary | ICD-10-CM | POA: Insufficient documentation

## 2012-08-25 DIAGNOSIS — Z8679 Personal history of other diseases of the circulatory system: Secondary | ICD-10-CM | POA: Insufficient documentation

## 2012-08-25 DIAGNOSIS — I4891 Unspecified atrial fibrillation: Secondary | ICD-10-CM | POA: Insufficient documentation

## 2012-08-25 DIAGNOSIS — Z8673 Personal history of transient ischemic attack (TIA), and cerebral infarction without residual deficits: Secondary | ICD-10-CM | POA: Insufficient documentation

## 2012-08-25 DIAGNOSIS — H3530 Unspecified macular degeneration: Secondary | ICD-10-CM | POA: Insufficient documentation

## 2012-08-25 DIAGNOSIS — I10 Essential (primary) hypertension: Secondary | ICD-10-CM | POA: Insufficient documentation

## 2012-08-25 DIAGNOSIS — Z950 Presence of cardiac pacemaker: Secondary | ICD-10-CM | POA: Insufficient documentation

## 2012-08-25 LAB — CBC WITH DIFFERENTIAL/PLATELET
Eosinophils Absolute: 0.2 10*3/uL (ref 0.0–0.7)
Hemoglobin: 12 g/dL (ref 12.0–15.0)
Lymphocytes Relative: 25 % (ref 12–46)
Lymphs Abs: 1.8 10*3/uL (ref 0.7–4.0)
MCH: 29.9 pg (ref 26.0–34.0)
MCV: 91.3 fL (ref 78.0–100.0)
Monocytes Relative: 7 % (ref 3–12)
Neutrophils Relative %: 65 % (ref 43–77)
RBC: 4.01 MIL/uL (ref 3.87–5.11)
WBC: 7.2 10*3/uL (ref 4.0–10.5)

## 2012-08-25 LAB — COMPREHENSIVE METABOLIC PANEL
ALT: 20 U/L (ref 0–35)
Alkaline Phosphatase: 68 U/L (ref 39–117)
BUN: 14 mg/dL (ref 6–23)
CO2: 30 mEq/L (ref 19–32)
GFR calc Af Amer: 65 mL/min — ABNORMAL LOW (ref >90)
GFR calc non Af Amer: 56 mL/min — ABNORMAL LOW (ref >90)
Glucose, Bld: 101 mg/dL — ABNORMAL HIGH (ref 70–99)
Potassium: 3.9 mEq/L (ref 3.5–5.1)
Total Bilirubin: 0.4 mg/dL (ref 0.3–1.2)
Total Protein: 6.8 g/dL (ref 6.0–8.3)

## 2012-08-25 LAB — URINALYSIS, ROUTINE W REFLEX MICROSCOPIC
Bilirubin Urine: NEGATIVE
Hgb urine dipstick: NEGATIVE
Ketones, ur: NEGATIVE mg/dL
Nitrite: POSITIVE — AB
Protein, ur: NEGATIVE mg/dL
Specific Gravity, Urine: 1.008 (ref 1.005–1.030)
Urobilinogen, UA: 0.2 mg/dL (ref 0.0–1.0)

## 2012-08-25 LAB — POCT I-STAT TROPONIN I: Troponin i, poc: 0 ng/mL (ref 0.00–0.08)

## 2012-08-25 LAB — URINE MICROSCOPIC-ADD ON

## 2012-08-25 LAB — PRO B NATRIURETIC PEPTIDE: Pro B Natriuretic peptide (BNP): 930.8 pg/mL — ABNORMAL HIGH (ref 0–450)

## 2012-08-25 MED ORDER — GI COCKTAIL ~~LOC~~
30.0000 mL | Freq: Once | ORAL | Status: AC
  Administered 2012-08-25: 30 mL via ORAL
  Filled 2012-08-25: qty 30

## 2012-08-25 NOTE — ED Notes (Signed)
Pt dc to home. Pt states understanding to dc instructions.  Pt taken to exit via w/c with son.

## 2012-08-25 NOTE — ED Notes (Signed)
Patient is resting comfortably. 

## 2012-08-25 NOTE — ED Notes (Signed)
Patient transported to X-ray 

## 2012-08-25 NOTE — ED Provider Notes (Addendum)
History     CSN: [id number]  Arrival date & time 08/25/12  1621   First MD Initiated Contact with Patient 08/25/12 1622      Chief Complaint  Patient presents with  . Chest Pain  . Nausea    (Consider location/radiation/quality/duration/timing/severity/associated sxs/prior treatment) Patient is a 79 y.o. female presenting with chest pain. The history is provided by the patient.  Chest Pain Pain location:  Substernal area Pain quality: aching and tightness   Pain radiates to:  L shoulder Pain radiates to the back: no   Pain severity:  Mild Onset quality:  Gradual Duration:  2 weeks Timing:  Intermittent Progression:  Worsening Chronicity:  Chronic Relieved by:  Nothing Worsened by:  Exertion Ineffective treatments:  Aspirin and nitroglycerin Associated symptoms: abdominal pain, anorexia, lower extremity edema, nausea and shortness of breath   Associated symptoms: no diaphoresis, not vomiting and no weakness   Associated symptoms comment:  Intermittent abd pain and constipation for the last 3 days but denies abd pain currently.  When she does have it, it is more like a cramp.  Also felt like she has not urinated as much today as normal Risk factors: coronary artery disease and hypertension   Risk factors: no prior DVT/PE and no smoking     Past Medical History  Diagnosis Date  . Transient ischemic attack (TIA)   . Atrial fibrillation   . Hypertension   . Dyslipidemia   . Gastritis   . Vitamin B12 deficiency   . Peptic ulcer disease   . Osteopenia   . Macular degeneration, right eye   . Arthritis     back, shoulders  . Cough 05/21/2011    pt states cough x 2 days  . Edema     left leg  . Hypoglycemia   . Urinary incontinence   . CAD 06/15/2009  . Sinoatrial node dysfunction 06/15/2009    Past Surgical History  Procedure Laterality Date  . Perforated ulcer    . Eye surgery      bilateral  . Pacemaker insertion    . Hip surgery      broke hip and has pin in it   . Drug-eluting stent  2008,209    2009 to LAD  . Cardiac catheterization      No family history on file.  History  Substance Use Topics  . Smoking status: Former Smoker    Quit date: 03/14/1996  . Smokeless tobacco: Never Used  . Alcohol Use: No    OB History   Grav Para Term Preterm Abortions TAB SAB Ect Mult Living                  Review of Systems  Constitutional: Negative for diaphoresis.  Respiratory: Positive for shortness of breath.   Cardiovascular: Positive for chest pain.  Gastrointestinal: Positive for nausea, abdominal pain, constipation and anorexia. Negative for vomiting and diarrhea.  Neurological: Negative for weakness.  All other systems reviewed and are negative.    Allergies  Ampicillin; Ciprofloxin hcl; Demerol; and Sulfa antibiotics  Home Medications   Current Outpatient Rx  Name  Route  Sig  Dispense  Refill  . acetaminophen (TYLENOL) 500 MG tablet   Oral   Take 1,000 mg by mouth 3 (three) times daily as needed. For pain         . albuterol (VENTOLIN HFA) 108 (90 BASE) MCG/ACT inhaler   Inhalation   Inhale 2 puffs into the lungs every 4 (four) hours  as needed. For shortness of breath. Wait 1 minute between each puff.         Marland Kitchen atorvastatin (LIPITOR) 10 MG tablet   Oral   Take 10 mg by mouth every morning.           . benzocaine-menthol (CHLORAEPTIC) 6-10 MG lozenge   Oral   Take 1 lozenge by mouth every 2 (two) hours as needed. For sore throat         . calcium carbonate (OS-CAL) 600 MG TABS   Oral   Take 600 mg by mouth 2 (two) times daily.           . Cholecalciferol (VITAMIN D3) 1000 UNITS CAPS   Oral   Take 1 capsule by mouth 2 (two) times daily.           Marland Kitchen docusate (COLACE) 50 MG/5ML liquid   Both Ears   Place 50 mg into both ears as directed. Instill 3-5 drops into both ears at bedtime every Saturday & Sunday once a month         . Fluticasone-Salmeterol (ADVAIR DISKUS) 100-50 MCG/DOSE AEPB   Inhalation    Inhale 1 puff into the lungs 2 (two) times daily. Rinse mouth after use.         . furosemide (LASIX) 40 MG tablet   Oral   Take 40 mg by mouth daily.         Marland Kitchen lactulose (CHRONULAC) 10 GM/15ML solution   Oral   Take 10 g by mouth 2 (two) times daily as needed. For constipation         . metoprolol succinate (TOPROL-XL) 100 MG 24 hr tablet   Oral   Take 100 mg by mouth daily. Take with or immediately following a meal.         . Multiple Vitamin (MULITIVITAMIN WITH MINERALS) TABS   Oral   Take 1 tablet by mouth daily.           . Multiple Vitamins-Minerals (ICAPS AREDS FORMULA PO)   Oral   Take 1 tablet by mouth 2 (two) times daily.           . nitrofurantoin, macrocrystal-monohydrate, (MACROBID) 100 MG capsule   Oral   Take 100 mg by mouth at bedtime.         . nitroGLYCERIN (NITRODUR - DOSED IN MG/24 HR) 0.4 mg/hr   Transdermal   Place 1 patch onto the skin daily.         . nitroGLYCERIN (NITROSTAT) 0.4 MG SL tablet   Sublingual   Place 0.4 mg under the tongue every 5 (five) minutes as needed. For chest pain up to 3 doses         . Omega-3 Fatty Acids (SEA-OMEGA 30 PO)   Oral   Take 1 capsule by mouth 2 (two) times daily.           Marland Kitchen omeprazole (PRILOSEC) 20 MG capsule   Oral   Take 20 mg by mouth 2 (two) times daily.           . polyethylene glycol (MIRALAX / GLYCOLAX) packet   Oral   Take 17 g by mouth daily.         . potassium chloride (K-DUR,KLOR-CON) 10 MEQ tablet   Oral   Take 10 mEq by mouth daily.         . traMADol (ULTRAM) 50 MG tablet   Oral   Take 50 mg by mouth See admin instructions.  Take 1 tablet every morning (scheduled). May take another tablet later in the day if additional pain relief is needed.         . traMADol (ULTRAM-ER) 100 MG 24 hr tablet   Oral   Take 100 mg by mouth daily.           . vitamin B-12 (CYANOCOBALAMIN) 1000 MCG tablet   Sublingual   Place 1,000 mcg under the tongue daily.            Marland Kitchen warfarin (COUMADIN) 2.5 MG tablet   Oral   Take 2.5 mg by mouth See admin instructions. Take 2.5mg  on Tues, Wed, Thurs, and Sun.         . warfarin (COUMADIN) 5 MG tablet   Oral   Take 5 mg by mouth See admin instructions. Take 5mg  Mon, Fri, and Sat           There were no vitals taken for this visit.  Physical Exam  Nursing note and vitals reviewed. Constitutional: She is oriented to person, place, and time. She appears well-developed and well-nourished. No distress.  HENT:  Head: Normocephalic and atraumatic.  Mouth/Throat: Oropharynx is clear and moist.  Eyes: Conjunctivae and EOM are normal. Pupils are equal, round, and reactive to light.  Neck: Normal range of motion. Neck supple.  Cardiovascular: Normal rate and intact distal pulses.  An irregularly irregular rhythm present.  Murmur heard.  Systolic murmur is present with a grade of 2/6  Murmur heard best at the LSB  Pulmonary/Chest: Effort normal and breath sounds normal. No respiratory distress. She has no wheezes. She has no rales.  Abdominal: Soft. Bowel sounds are normal. She exhibits no distension. There is no tenderness. There is no rebound and no guarding.  Musculoskeletal: Normal range of motion. She exhibits edema. She exhibits no tenderness.  Trace edema in the right foot and 2+ edema in the left up to ankle  Neurological: She is alert and oriented to person, place, and time.  Skin: Skin is warm and dry. No rash noted. No erythema.  Psychiatric: She has a normal mood and affect. Her behavior is normal.    ED Course  Procedures (including critical care time)  Labs Reviewed  COMPREHENSIVE METABOLIC PANEL - Abnormal; Notable for the following:    Glucose, Bld 101 (*)    Albumin 3.3 (*)    GFR calc non Af Amer 56 (*)    GFR calc Af Amer 65 (*)    All other components within normal limits  PRO B NATRIURETIC PEPTIDE - Abnormal; Notable for the following:    Pro B Natriuretic peptide (BNP) 930.8 (*)     All other components within normal limits  URINALYSIS, ROUTINE W REFLEX MICROSCOPIC - Abnormal; Notable for the following:    Nitrite POSITIVE (*)    All other components within normal limits  PROTIME-INR - Abnormal; Notable for the following:    Prothrombin Time 24.1 (*)    INR 2.28 (*)    All other components within normal limits  URINE MICROSCOPIC-ADD ON - Abnormal; Notable for the following:    Bacteria, UA MANY (*)    All other components within normal limits  CBC WITH DIFFERENTIAL  LIPASE, BLOOD  POCT I-STAT TROPONIN I  POCT I-STAT TROPONIN I   Dg Chest 2 View  08/25/2012  *RADIOLOGY REPORT*  Clinical Data: Chest pain and shortness of breath.  CHEST - 2 VIEW  Comparison: 01/11/2012.  Findings: The cardiac silhouette, mediastinal and hilar  contours are stable.  There is tortuosity, ectasia and calcification of the thoracic aorta.  Coronary artery stents are noted.  The pacer wires are stable.  The lungs demonstrate chronic changes.  No acute pulmonary findings.  No pleural effusion.  The bony thorax is intact.  Stable compression fracture in the mid thoracic spine.  IMPRESSION: Chronic lung changes but no definite acute overlying pulmonary process.   Original Report Authenticated By: Rudie Meyer, M.D.      Date: 08/25/2012  Rate: 60  Rhythm: normal sinus rhythm and premature ventricular contractions (PVC)  QRS Axis: normal  Intervals: normal  ST/T Wave abnormalities: nonspecific ST changes  Conduction Disutrbances:none  Narrative Interpretation:   Old EKG Reviewed: unchanged   1. Chest pain       MDM   Patient with a significant past medical history first CHF, coronary artery disease status post pacemaker placement and atrial fibrillation on Coumadin.  Patient sent from her doctor's office today because they felt her EKG was different from prior. Patient has chest pain daily and wears a nitroglycerin patch however she has had the patch and had multiple sublingual nitros  without improvement in her chest pain today. Also she states over last few weeks she's had worsening shortness of breath. In addition she complains of nausea and constipation that has been ongoing for the last few days. She states she has not had a bowel movement for the last 3 days despite taking her normal medications. Patient has no abnormal pain on exam. She has mild basilar rales bilaterally in lower extremity edema worse on the left foot she states is unchanged from her baseline.  She is currently in no acute distress and hemodynamically stable with normal vital signs. Patient's symptoms could densely be cardiac related given her prior heart history. Angina versus CHF as the cause for her symptoms of chest pain and shortness of breath. Also some of her nausea and other symptoms could be related to constipation and she has not gone to the bathroom. She has no abdominal pain on exam and has normal bowel sounds. Low suspicion for obstruction, pancreatitis, cholecystitis or appendicitis.  CBC, CMP, BMP, UA, lipase, PT/INR pending.  Patient has received aspirin and multiple nitroglycerin without change in her chest pain. We'll give IV morphine and Zofran to see if that improves her symptoms. Based on EKG today there are no acute changes  6:03 PM All labs unchanged with persistent BNP in the 800-900 and troponin neg.  Pt seen in nov for similar and at that time was ruled out for cardiac cause.  Pt was given a GI cocktail here and pain has completely resolved.  Discussed with son and pt and feel it is reasonable to get second troponin and will discuss with cards.  9:08 PM Second enzymes neg.  Discussed with Dr. Katrinka Blazing and he is ok with pt going home and f/u in office.  Gwyneth Sprout, MD 08/25/12 2536  Gwyneth Sprout, MD 08/25/12 2112

## 2012-08-25 NOTE — ED Notes (Signed)
Family at bedside. 

## 2012-08-25 NOTE — ED Notes (Signed)
Sent from MD's office (pt from [facility]) - pt c/o constant CP, nausea every morning x 10 days. Given NTG x 2 at nrsg home, NTG x1 at MD's office, ASA given by EMS enroute. Pt reports pain unchanged after NTG. MD's office reports EKG changes

## 2012-08-27 DIAGNOSIS — R0789 Other chest pain: Secondary | ICD-10-CM

## 2012-08-27 DIAGNOSIS — R630 Anorexia: Secondary | ICD-10-CM

## 2012-08-27 HISTORY — DX: Other chest pain: R07.89

## 2012-08-27 HISTORY — DX: Anorexia: R63.0

## 2012-10-10 ENCOUNTER — Ambulatory Visit (INDEPENDENT_AMBULATORY_CARE_PROVIDER_SITE_OTHER): Payer: Medicare Other | Admitting: Emergency Medicine

## 2012-10-10 ENCOUNTER — Ambulatory Visit: Payer: Medicare Other

## 2012-10-10 VITALS — BP 118/60 | HR 60 | Temp 97.7°F | Resp 16 | Ht 62.0 in | Wt 114.0 lb

## 2012-10-10 DIAGNOSIS — R042 Hemoptysis: Secondary | ICD-10-CM

## 2012-10-10 MED ORDER — AZITHROMYCIN 250 MG PO TABS: ORAL_TABLET | ORAL | Status: DC

## 2012-10-10 NOTE — Progress Notes (Signed)
[facility] [address], [place] [address] [phone]  Date:  10/10/2012   Name:  [name]   DOB:  1915/07/25   MRN:  [id number]  PCP:  Londell Moh, MD    Chief Complaint: Cough   History of Present Illness:  [name] is a 79 y.o. very pleasant female patient who presents with the following:  Gives a history of hemoptysis for the past ten days.  Has some sputum production in AM cough.   No shortness of breath or wheezing. Has nasal congestion and a mucoid drainage.  Some difficulty swallowing carbonated drinks.   15 pounds weight loss.  No nausea or vomiting. No stool change.  Stopped smoking 20 years ago.  No rash.  ljo Denies other complaint or health concern today.   Patient Active Problem List   Diagnosis Date Noted  . Rib fracture 05/22/2011  . Compression fracture of thoracic vertebra 05/22/2011  . Chest pain at rest 05/21/2011    Class: Acute  . Dyspnea 05/21/2011  . Long-term (current) use of anticoagulants   . HYPERTENSION 06/15/2009  . CAD 06/15/2009  . Paroxysmal atrial fibrillation 06/15/2009  . Sinoatrial node dysfunction 06/15/2009  . Cardiac pacemaker in situ 03/22/2009    Past Medical History  Diagnosis Date  . Transient ischemic attack (TIA)   . Atrial fibrillation   . Hypertension   . Dyslipidemia   . Gastritis   . Vitamin B12 deficiency   . Peptic ulcer disease   . Osteopenia   . Macular degeneration, right eye   . Arthritis     back, shoulders  . Cough 05/21/2011    pt states cough x 2 days  . Edema     left leg  . Hypoglycemia   . Urinary incontinence   . CAD 06/15/2009  . Sinoatrial node dysfunction 06/15/2009    Past Surgical History  Procedure Laterality Date  . Perforated ulcer    . Eye surgery      bilateral  . Pacemaker insertion    . Hip surgery      broke hip and has pin in it  . Drug-eluting stent  2008,209    2009 to LAD  . Cardiac catheterization      History   Substance Use Topics  . Smoking status: Former Smoker    Quit date: 03/14/1996  . Smokeless tobacco: Never Used  . Alcohol Use: No    No family history on file.  Allergies  Allergen Reactions  . Ampicillin Other (See Comments)    Reaction Unknown   . Ciprofloxin Hcl (Ciprofloxacin Hcl) Other (See Comments)    Reaction Unknown   . Demerol Other (See Comments)    Reaction Unknown   . Sulfa Antibiotics Rash    Medication list has been reviewed and updated.  Current Outpatient Prescriptions on File Prior to Visit  Medication Sig Dispense Refill  . albuterol (VENTOLIN HFA) 108 (90 BASE) MCG/ACT inhaler Inhale 2 puffs into the lungs every 4 (four) hours as needed for wheezing or shortness of breath. Wait 1 minute between each puff.      . calcium carbonate (OS-CAL) 600 MG TABS Take 600 mg by mouth 2 (two) times daily.        . Cholecalciferol (VITAMIN D3) 1000 UNITS CAPS Take 1 capsule by mouth 2 (two) times daily.       . Cyanocobalamin (VITAMIN B-12) 1000 MCG SUBL Place 1 tablet under the tongue daily.      Marland Kitchen  docusate (COLACE) 50 MG/5ML liquid Place into both ears See admin instructions. Instill 3-5 drops into both ears at bedtime  once a month on Saturday and Sunday      . Fluticasone-Salmeterol (ADVAIR DISKUS) 100-50 MCG/DOSE AEPB Inhale 1 puff into the lungs 2 (two) times daily. Rinse mouth after use.      . furosemide (LASIX) 40 MG tablet Take 40 mg by mouth daily.      . Hydrocodone-Acetaminophen 5-300 MG TABS Take 1 tablet by mouth 3 (three) times daily.      Marland Kitchen lactose free nutrition (BOOST) LIQD Take 237 mLs by mouth daily as needed (for low weight). 10 am snack if weight drops below 120 lbs.      . metoprolol succinate (TOPROL-XL) 100 MG 24 hr tablet Take 100 mg by mouth daily. Take with or immediately following a meal.      . Multiple Vitamins-Minerals (CERTAVITE/ANTIOXIDANTS) TABS Take 1 tablet by mouth daily.      . Multiple Vitamins-Minerals (ICAPS AREDS FORMULA PO) Take  1 tablet by mouth 2 (two) times daily.        . nitroGLYCERIN (NITRODUR - DOSED IN MG/24 HR) 0.4 mg/hr Place 1 patch onto the skin every evening. Nitrate free interval from 4pm - 8pm every day      . nitroGLYCERIN (NITROSTAT) 0.4 MG SL tablet Place 0.4 mg under the tongue every 5 (five) minutes as needed for chest pain (up to 3 doses).       . Omega-3 Fatty Acids (FISH OIL) 1000 MG CAPS Take 1,000 mg by mouth 2 (two) times daily.      . pantoprazole (PROTONIX) 40 MG tablet Take 40 mg by mouth 2 (two) times daily.      . polyethylene glycol (MIRALAX / GLYCOLAX) packet Take 17 g by mouth every other day.       . potassium chloride (K-DUR,KLOR-CON) 10 MEQ tablet Take 10 mEq by mouth daily.      . solifenacin (VESICARE) 10 MG tablet Take 10 mg by mouth every evening.      . traMADol (ULTRAM) 50 MG tablet Take 50 mg by mouth daily.       Marland Kitchen warfarin (COUMADIN) 2.5 MG tablet Take 2.5 mg by mouth See admin instructions. Take 2.5mg  (1 tablet) in the evening on Tuesday, Wednesday, Thursday, Friday, Saturday and Sunday (5 mg tablet on Monday)      . warfarin (COUMADIN) 5 MG tablet Take 5 mg by mouth See admin instructions. Take 5mg  tablet in the evening on Monday, (2.5 mg tablet on all other days)      . atorvastatin (LIPITOR) 10 MG tablet Take 10 mg by mouth every evening.       . nitrofurantoin, macrocrystal-monohydrate, (MACROBID) 100 MG capsule Take 100 mg by mouth at bedtime.       No current facility-administered medications on file prior to visit.    Review of Systems:  As per HPI, otherwise negative.    Physical Examination: Filed Vitals:   10/10/12 1624  BP: 118/60  Pulse: 60  Temp: 97.7 F (36.5 C)  Resp: 16   Filed Vitals:   10/10/12 1624  Height: 5\' 2"  (1.575 m)  Weight: 114 lb (51.71 kg)   Body mass index is 20.85 kg/(m^2). Ideal Body Weight: Weight in (lb) to have BMI = 25: 136.4  GEN: WDWN, NAD, Non-toxic, A & O x 3 HEENT: Atraumatic, Normocephalic. Neck supple. No masses,  No LAD. Ears and Nose: No external deformity.  CV: RRR, No M/G/R. No JVD. No thrill. No extra heart sounds. PULM: CTA B, no wheezes, crackles, rhonchi. No retractions. No resp. distress. No accessory muscle use. ABD: S, NT, ND, +BS. No rebound. No HSM. EXTR: No c/c/e NEURO Normal gait.  PSYCH: Normally interactive. Conversant. Not depressed or anxious appearing.  Calm demeanor.    Assessment and Plan: Hemoptysis zithromax Follow up as needed   Signed,  Phillips Odor, MD   [facility] reading (PRIMARY) by  Dr. Dareen Piano. Unchanged from previous study.  No acute disease.

## 2012-10-10 NOTE — Patient Instructions (Addendum)
Hemoptysis  Hemoptysis, which means coughing up blood, can be a sign of a minor problem or a serious medical condition. The blood that is coughed up may come from the lungs and airways. Coughed-up blood can also come from bleeding that occurs outside the lungs and airways. Blood can drain into the windpipe during a severe nosebleed or when blood is vomited from the stomach. Because hemoptysis can be a sign of something serious, a medical evaluation is required. For some people with hemoptysis, no definite cause is ever identified.  CAUSES   The most common cause of hemoptysis is bronchitis. Some other common causes include:   · A ruptured blood vessel caused by coughing or an infection.    · A medical condition that causes damage to the large air passageways (bronchiectasis).    · A blood clot in the lungs (pulmonary embolism).    · Pneumonia.    · Tuberculosis.    · Breathing in a small foreign object.    · Cancer.  For some people with hemoptysis, no definite cause is ever identified.    HOME CARE INSTRUCTIONS  · Only take over-the-counter or prescription medicines as directed by your caregiver. Do not use cough suppressants unless your caregiver approves.  · If your caregiver prescribes antibiotic medicines, take them as directed. Finish them even if you start to feel better.  · Do not smoke. Also avoid secondhand smoke.  · Follow up with your caregiver as directed.  SEEK IMMEDIATE MEDICAL CARE IF:   · You cough up bloody mucus for longer than a week.  · You have a blood-producing cough that is severe or getting worse.  · You have a blood-producing cough that comes and goes over time.  · You develop problems with your breathing.    · You vomit blood.  · You develop bloody or black-colored stools.  · You have chest pain.    · You develop night sweats.  · You feel faint or pass out.    · You have a fever or persistent symptoms for more than 2 3 days.    · You have a fever and your symptoms suddenly get worse.  MAKE  SURE YOU:  · Understand these instructions.  · Will watch your condition.  · Will get help right away if you are not doing well or get worse.  Document Released: 07/09/2001 Document Revised: 04/16/2012 Document Reviewed: 02/15/2012  [employer]® Patient Information ©2014 [employer].

## 2013-04-29 ENCOUNTER — Ambulatory Visit (INDEPENDENT_AMBULATORY_CARE_PROVIDER_SITE_OTHER): Payer: Medicare Other | Admitting: Internal Medicine

## 2013-04-29 ENCOUNTER — Encounter (INDEPENDENT_AMBULATORY_CARE_PROVIDER_SITE_OTHER): Payer: Self-pay

## 2013-04-29 ENCOUNTER — Encounter: Payer: Self-pay | Admitting: Internal Medicine

## 2013-04-29 VITALS — BP 92/68 | HR 60 | Ht 62.0 in | Wt 120.1 lb

## 2013-04-29 DIAGNOSIS — Z7901 Long term (current) use of anticoagulants: Secondary | ICD-10-CM

## 2013-04-29 DIAGNOSIS — Z950 Presence of cardiac pacemaker: Secondary | ICD-10-CM

## 2013-04-29 DIAGNOSIS — I4891 Unspecified atrial fibrillation: Secondary | ICD-10-CM

## 2013-04-29 DIAGNOSIS — I495 Sick sinus syndrome: Secondary | ICD-10-CM

## 2013-04-29 DIAGNOSIS — I959 Hypotension, unspecified: Secondary | ICD-10-CM

## 2013-04-29 LAB — MDC_IDC_ENUM_SESS_TYPE_INCLINIC
Battery Impedance: 1000 Ohm — CL
Brady Statistic RA Percent Paced: 90 %
Implantable Pulse Generator Serial Number: 7048529
Lead Channel Setting Pacing Amplitude: 2 V
Lead Channel Setting Pacing Amplitude: 2.5 V
Lead Channel Setting Pacing Pulse Width: 0.5 ms
Lead Channel Setting Sensing Sensitivity: 2 mV

## 2013-04-29 MED ORDER — METOPROLOL SUCCINATE ER 50 MG PO TB24: 50.0000 mg | ORAL_TABLET | Freq: Every day | ORAL | Status: DC

## 2013-04-29 MED ORDER — METOPROLOL SUCCINATE ER 100 MG PO TB24: 50.0000 mg | ORAL_TABLET | Freq: Every day | ORAL | Status: DC

## 2013-04-29 NOTE — Patient Instructions (Addendum)
Your physician wants you to follow-up in: 6 months in the device clinic and 12 months with Dr [name] [name] will receive a reminder letter in the mail two months in advance. If you don't receive a letter, please call our office to schedule the follow-up appointment.  Your physician has recommended you make the following change in your medication:  1) Decrease Metoprolol to 50mg  daily---take 1/2 of a 100mg  tablet

## 2013-05-01 IMAGING — CR DG CHEST 2V
2 series · 2 of 2 positions shown · non-contrast
Comparison: Chest radiograph performed 05/21/2011

CLINICAL DATA: Hemoptysis; atrial fibrillation.  Abdominal pain.

CHEST - 2 VIEW

[w chest lat]
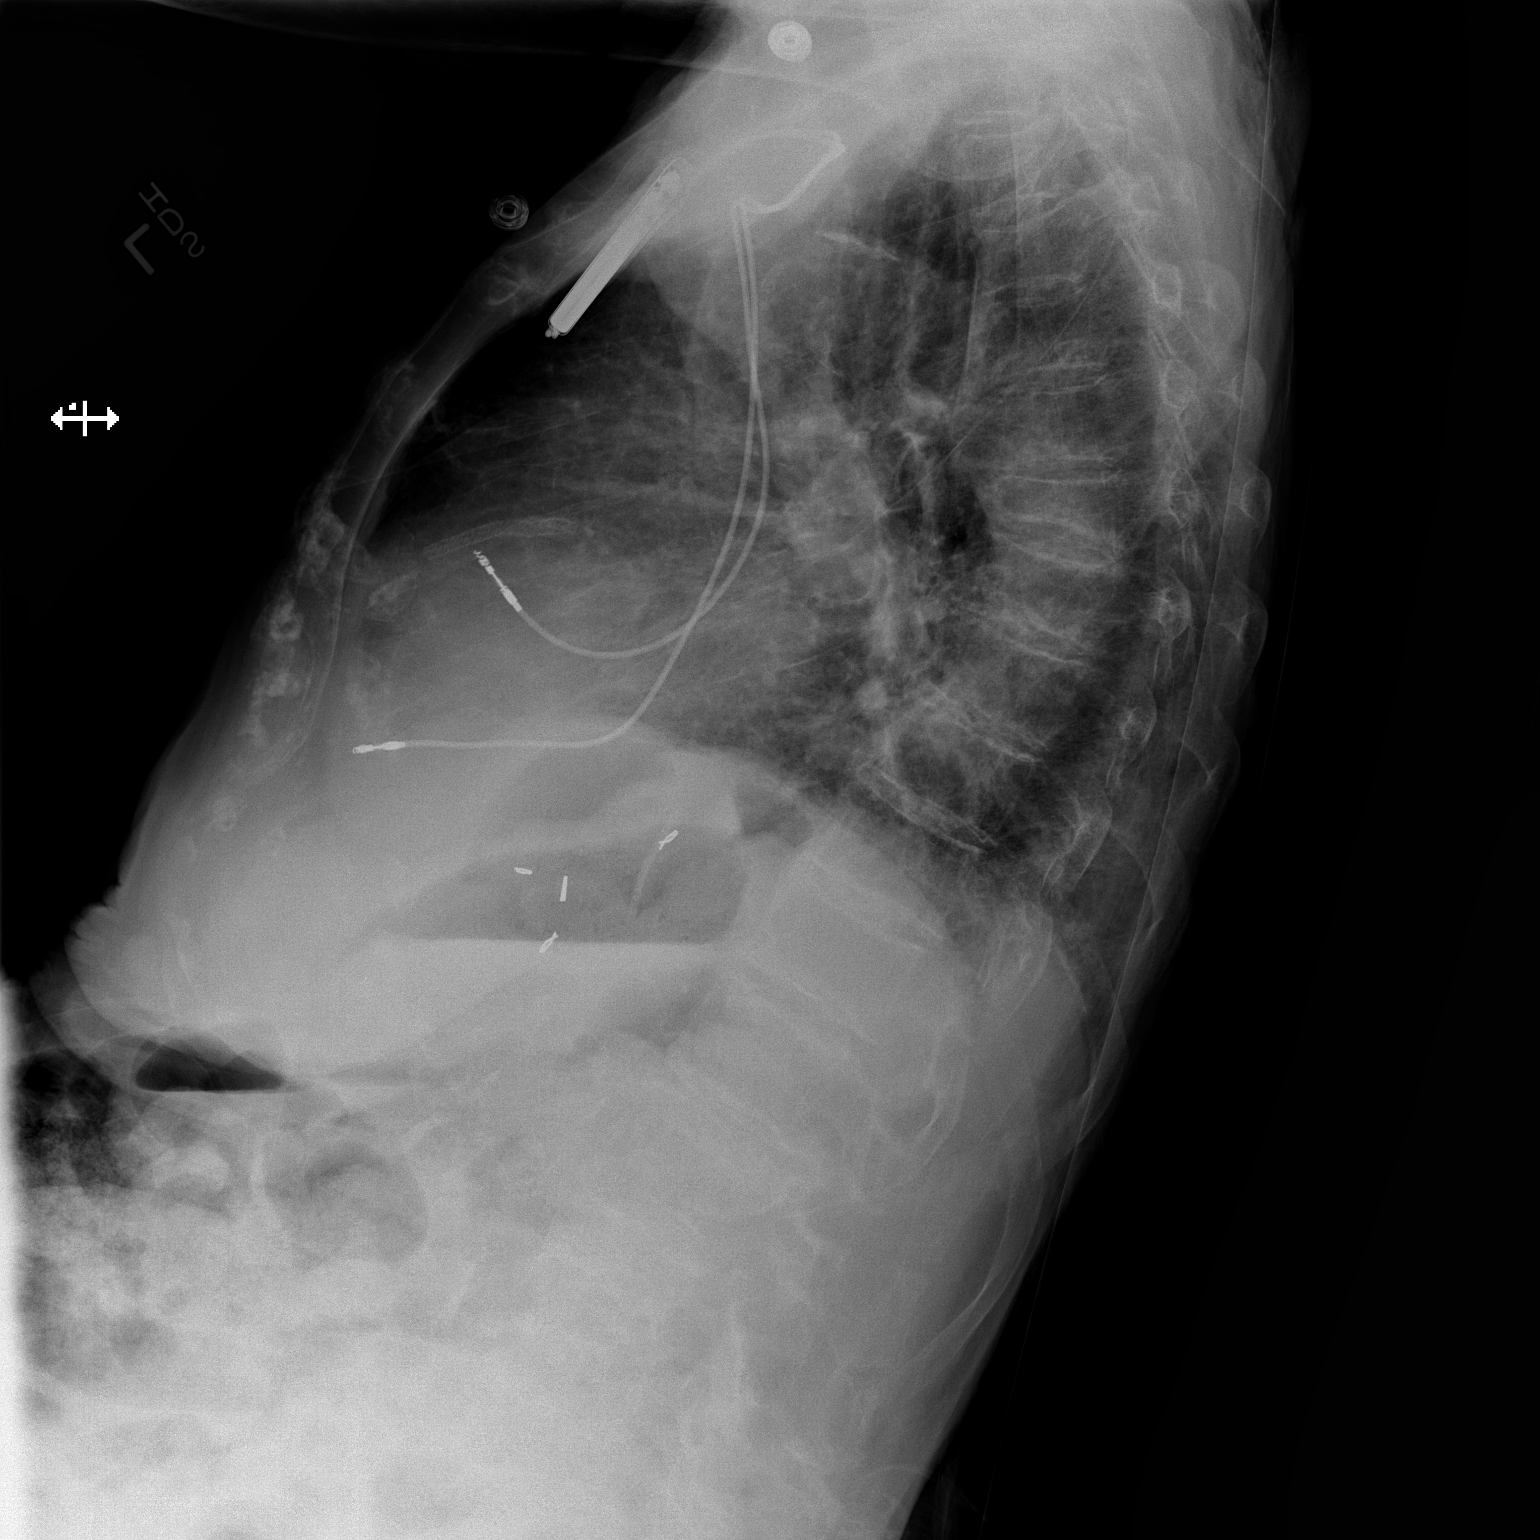

[x chest ap]
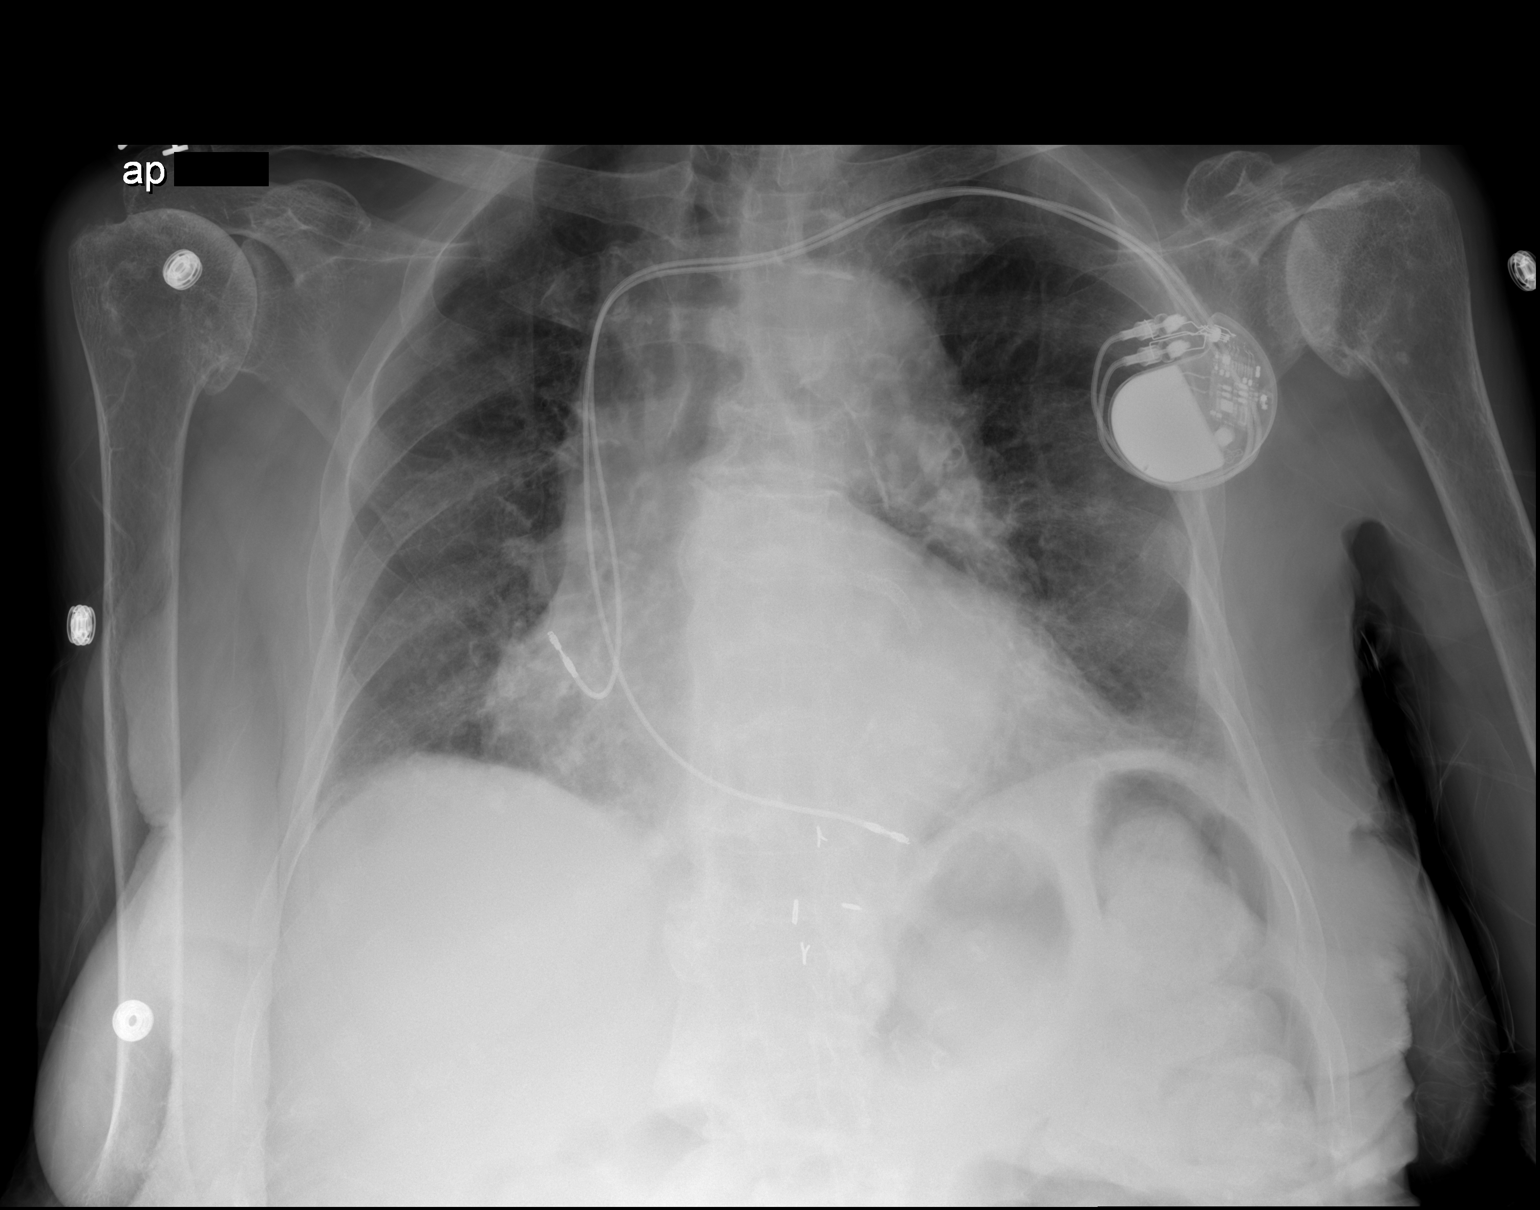

[2 of 2 positions shown; findings below may reference images not displayed]

FINDINGS: The lungs are mildly hypoexpanded.  Minimal bibasilar
atelectasis is noted.  Mild peribronchial thickening is again seen.
No pleural effusion or pneumothorax is identified.

The heart is mildly enlarged; a pacemaker is noted at the left
chest wall, with leads ending at the right atrium and right
ventricle.  No acute osseous abnormalities are seen.  Chronic
degenerative change is noted at both glenohumeral joints.
Scattered clips are seen about the gastroesophageal junction.
IMPRESSION: 1.  Lungs mildly hypoexpanded; minimal bibasilar atelectasis and
mild chronic peribronchial thickening seen.
2.  Mild cardiomegaly.
3.  Chronic degenerative change at both glenohumeral joints.

## 2013-05-04 ENCOUNTER — Inpatient Hospital Stay (HOSPITAL_COMMUNITY)
Admission: EM | Admit: 2013-05-04 | Discharge: 2013-05-11 | DRG: 193 | Disposition: A | Discharge status: Skilled Nursing Facility | Payer: Medicare Other | Attending: Internal Medicine | Admitting: Internal Medicine

## 2013-05-04 ENCOUNTER — Emergency Department (HOSPITAL_COMMUNITY): Payer: Medicare Other

## 2013-05-04 ENCOUNTER — Encounter (HOSPITAL_COMMUNITY): Payer: Self-pay | Admitting: Emergency Medicine

## 2013-05-04 DIAGNOSIS — Z950 Presence of cardiac pacemaker: Secondary | ICD-10-CM

## 2013-05-04 DIAGNOSIS — E876 Hypokalemia: Secondary | ICD-10-CM

## 2013-05-04 DIAGNOSIS — Z8673 Personal history of transient ischemic attack (TIA), and cerebral infarction without residual deficits: Secondary | ICD-10-CM

## 2013-05-04 DIAGNOSIS — E785 Hyperlipidemia, unspecified: Secondary | ICD-10-CM

## 2013-05-04 DIAGNOSIS — I495 Sick sinus syndrome: Secondary | ICD-10-CM

## 2013-05-04 DIAGNOSIS — M899 Disorder of bone, unspecified: Secondary | ICD-10-CM | POA: Diagnosis present

## 2013-05-04 DIAGNOSIS — Z823 Family history of stroke: Secondary | ICD-10-CM

## 2013-05-04 DIAGNOSIS — S2239XS Fracture of one rib, unspecified side, sequela: Secondary | ICD-10-CM

## 2013-05-04 DIAGNOSIS — Z807 Family history of other malignant neoplasms of lymphoid, hematopoietic and related tissues: Secondary | ICD-10-CM

## 2013-05-04 DIAGNOSIS — R0609 Other forms of dyspnea: Secondary | ICD-10-CM

## 2013-05-04 DIAGNOSIS — I4891 Unspecified atrial fibrillation: Secondary | ICD-10-CM

## 2013-05-04 DIAGNOSIS — Z820 Family history of epilepsy and other diseases of the nervous system: Secondary | ICD-10-CM

## 2013-05-04 DIAGNOSIS — I10 Essential (primary) hypertension: Secondary | ICD-10-CM

## 2013-05-04 DIAGNOSIS — I959 Hypotension, unspecified: Secondary | ICD-10-CM

## 2013-05-04 DIAGNOSIS — Z79899 Other long term (current) drug therapy: Secondary | ICD-10-CM

## 2013-05-04 DIAGNOSIS — Z8711 Personal history of peptic ulcer disease: Secondary | ICD-10-CM

## 2013-05-04 DIAGNOSIS — Z87891 Personal history of nicotine dependence: Secondary | ICD-10-CM

## 2013-05-04 DIAGNOSIS — Z8249 Family history of ischemic heart disease and other diseases of the circulatory system: Secondary | ICD-10-CM

## 2013-05-04 DIAGNOSIS — J189 Pneumonia, unspecified organism: Principal | ICD-10-CM

## 2013-05-04 DIAGNOSIS — J9600 Acute respiratory failure, unspecified whether with hypoxia or hypercapnia: Secondary | ICD-10-CM | POA: Diagnosis present

## 2013-05-04 DIAGNOSIS — E538 Deficiency of other specified B group vitamins: Secondary | ICD-10-CM | POA: Diagnosis present

## 2013-05-04 DIAGNOSIS — I509 Heart failure, unspecified: Secondary | ICD-10-CM

## 2013-05-04 DIAGNOSIS — I2510 Atherosclerotic heart disease of native coronary artery without angina pectoris: Secondary | ICD-10-CM

## 2013-05-04 DIAGNOSIS — J4489 Other specified chronic obstructive pulmonary disease: Secondary | ICD-10-CM | POA: Diagnosis present

## 2013-05-04 DIAGNOSIS — R791 Abnormal coagulation profile: Secondary | ICD-10-CM

## 2013-05-04 DIAGNOSIS — R0600 Dyspnea, unspecified: Secondary | ICD-10-CM

## 2013-05-04 DIAGNOSIS — E440 Moderate protein-calorie malnutrition: Secondary | ICD-10-CM | POA: Diagnosis present

## 2013-05-04 DIAGNOSIS — I5030 Unspecified diastolic (congestive) heart failure: Secondary | ICD-10-CM | POA: Diagnosis present

## 2013-05-04 DIAGNOSIS — Z808 Family history of malignant neoplasm of other organs or systems: Secondary | ICD-10-CM

## 2013-05-04 DIAGNOSIS — K219 Gastro-esophageal reflux disease without esophagitis: Secondary | ICD-10-CM | POA: Diagnosis present

## 2013-05-04 DIAGNOSIS — J449 Chronic obstructive pulmonary disease, unspecified: Secondary | ICD-10-CM | POA: Diagnosis present

## 2013-05-04 DIAGNOSIS — J40 Bronchitis, not specified as acute or chronic: Secondary | ICD-10-CM | POA: Diagnosis present

## 2013-05-04 DIAGNOSIS — H3530 Unspecified macular degeneration: Secondary | ICD-10-CM | POA: Diagnosis present

## 2013-05-04 DIAGNOSIS — I5032 Chronic diastolic (congestive) heart failure: Secondary | ICD-10-CM

## 2013-05-04 DIAGNOSIS — D638 Anemia in other chronic diseases classified elsewhere: Secondary | ICD-10-CM

## 2013-05-04 DIAGNOSIS — S22000A Wedge compression fracture of unspecified thoracic vertebra, initial encounter for closed fracture: Secondary | ICD-10-CM

## 2013-05-04 DIAGNOSIS — Z7901 Long term (current) use of anticoagulants: Secondary | ICD-10-CM

## 2013-05-04 DIAGNOSIS — S2239XA Fracture of one rib, unspecified side, initial encounter for closed fracture: Secondary | ICD-10-CM

## 2013-05-04 DIAGNOSIS — I5033 Acute on chronic diastolic (congestive) heart failure: Secondary | ICD-10-CM | POA: Diagnosis present

## 2013-05-04 LAB — BASIC METABOLIC PANEL
Calcium: 9.2 mg/dL (ref 8.4–10.5)
Creatinine, Ser: 0.75 mg/dL (ref 0.50–1.10)
GFR calc non Af Amer: 70 mL/min — ABNORMAL LOW (ref >90)
Glucose, Bld: 138 mg/dL — ABNORMAL HIGH (ref 70–99)
Sodium: 135 mEq/L (ref 135–145)

## 2013-05-04 LAB — CBC WITH DIFFERENTIAL/PLATELET
Basophils Absolute: 0 10*3/uL (ref 0.0–0.1)
Eosinophils Absolute: 0 10*3/uL (ref 0.0–0.7)
Eosinophils Relative: 0 % (ref 0–5)
Lymphs Abs: 0.7 10*3/uL (ref 0.7–4.0)
MCH: 30.6 pg (ref 26.0–34.0)
MCV: 93.2 fL (ref 78.0–100.0)
Monocytes Absolute: 0.8 10*3/uL (ref 0.1–1.0)
Platelets: 163 10*3/uL (ref 150–400)
RDW: 13.4 % (ref 11.5–15.5)

## 2013-05-04 LAB — TROPONIN I: Troponin I: <0.3 ng/mL (ref <0.30)

## 2013-05-04 LAB — CBC
HCT: 34.2 % — ABNORMAL LOW (ref 36.0–46.0)
Hemoglobin: 11 g/dL — ABNORMAL LOW (ref 12.0–15.0)
MCH: 30.5 pg (ref 26.0–34.0)
MCV: 94.7 fL (ref 78.0–100.0)
Platelets: 166 10*3/uL (ref 150–400)
RBC: 3.61 MIL/uL — ABNORMAL LOW (ref 3.87–5.11)
RDW: 13.3 % (ref 11.5–15.5)
WBC: 8.8 10*3/uL (ref 4.0–10.5)

## 2013-05-04 LAB — MAGNESIUM: Magnesium: 2.1 mg/dL (ref 1.5–2.5)

## 2013-05-04 LAB — EXPECTORATED SPUTUM ASSESSMENT W GRAM STAIN, RFLX TO RESP C

## 2013-05-04 LAB — PROTIME-INR
INR: 2.48 — ABNORMAL HIGH (ref 0.00–1.49)
Prothrombin Time: 26 seconds — ABNORMAL HIGH (ref 11.6–15.2)

## 2013-05-04 LAB — CREATININE, SERUM: GFR calc Af Amer: 82 mL/min — ABNORMAL LOW (ref >90)

## 2013-05-04 MED ORDER — SODIUM CHLORIDE 0.9 % IV SOLN
INTRAVENOUS | Status: DC
  Administered 2013-05-04 – 2013-05-06 (×3): via INTRAVENOUS

## 2013-05-04 MED ORDER — ALBUTEROL SULFATE (5 MG/ML) 0.5% IN NEBU
2.5000 mg | INHALATION_SOLUTION | RESPIRATORY_TRACT | Status: DC
  Administered 2013-05-04: 2.5 mg via RESPIRATORY_TRACT

## 2013-05-04 MED ORDER — ENOXAPARIN SODIUM 40 MG/0.4ML ~~LOC~~ SOLN
40.0000 mg | SUBCUTANEOUS | Status: DC
  Filled 2013-05-04: qty 0.4

## 2013-05-04 MED ORDER — CALCIUM CARBONATE 600 MG PO TABS: 600.0000 mg | ORAL_TABLET | Freq: Two times a day (BID) | ORAL | Status: DC

## 2013-05-04 MED ORDER — POTASSIUM CHLORIDE CRYS ER 10 MEQ PO TBCR
10.0000 meq | EXTENDED_RELEASE_TABLET | Freq: Every day | ORAL | Status: DC
  Administered 2013-05-05 – 2013-05-11 (×7): 10 meq via ORAL
  Filled 2013-05-04 (×7): qty 1

## 2013-05-04 MED ORDER — IPRATROPIUM BROMIDE 0.02 % IN SOLN
RESPIRATORY_TRACT | Status: AC
  Filled 2013-05-04: qty 2.5

## 2013-05-04 MED ORDER — VITAMIN B-12 1000 MCG SL SUBL: 1.0000 | SUBLINGUAL_TABLET | Freq: Every day | SUBLINGUAL | Status: DC

## 2013-05-04 MED ORDER — OMEGA-3-ACID ETHYL ESTERS 1 G PO CAPS
1.0000 g | ORAL_CAPSULE | Freq: Two times a day (BID) | ORAL | Status: DC
  Administered 2013-05-04 – 2013-05-11 (×12): 1 g via ORAL
  Filled 2013-05-04 (×15): qty 1

## 2013-05-04 MED ORDER — WARFARIN SODIUM 2.5 MG PO TABS
2.5000 mg | ORAL_TABLET | Freq: Once | ORAL | Status: AC
  Administered 2013-05-04: 2.5 mg via ORAL
  Filled 2013-05-04: qty 1

## 2013-05-04 MED ORDER — ADULT MULTIVITAMIN W/MINERALS CH
1.0000 | ORAL_TABLET | Freq: Every day | ORAL | Status: DC
  Administered 2013-05-05 – 2013-05-11 (×7): 1 via ORAL
  Filled 2013-05-04 (×7): qty 1

## 2013-05-04 MED ORDER — MOMETASONE FURO-FORMOTEROL FUM 100-5 MCG/ACT IN AERO
2.0000 | INHALATION_SPRAY | Freq: Two times a day (BID) | RESPIRATORY_TRACT | Status: DC
  Administered 2013-05-04 – 2013-05-11 (×14): 2 via RESPIRATORY_TRACT
  Filled 2013-05-04: qty 8.8

## 2013-05-04 MED ORDER — ALBUTEROL SULFATE (2.5 MG/3ML) 0.083% IN NEBU
2.5000 mg | INHALATION_SOLUTION | Freq: Three times a day (TID) | RESPIRATORY_TRACT | Status: DC
  Administered 2013-05-05 – 2013-05-11 (×20): 2.5 mg via RESPIRATORY_TRACT
  Filled 2013-05-04 (×4): qty 0.5
  Filled 2013-05-04: qty 3
  Filled 2013-05-04 (×4): qty 0.5
  Filled 2013-05-04: qty 3
  Filled 2013-05-04 (×9): qty 0.5

## 2013-05-04 MED ORDER — NITROGLYCERIN 0.4 MG SL SUBL: 0.4000 mg | SUBLINGUAL_TABLET | SUBLINGUAL | Status: DC | PRN

## 2013-05-04 MED ORDER — AZTREONAM 2 G IJ SOLR
2.0000 g | Freq: Three times a day (TID) | INTRAMUSCULAR | Status: DC
  Administered 2013-05-04 – 2013-05-05 (×2): 2 g via INTRAVENOUS
  Filled 2013-05-04 (×3): qty 2

## 2013-05-04 MED ORDER — PANTOPRAZOLE SODIUM 40 MG PO TBEC
40.0000 mg | DELAYED_RELEASE_TABLET | Freq: Two times a day (BID) | ORAL | Status: DC
  Administered 2013-05-04 – 2013-05-11 (×12): 40 mg via ORAL
  Filled 2013-05-04 (×16): qty 1

## 2013-05-04 MED ORDER — DARIFENACIN HYDROBROMIDE ER 15 MG PO TB24
15.0000 mg | ORAL_TABLET | Freq: Every day | ORAL | Status: DC
  Administered 2013-05-05 – 2013-05-11 (×7): 15 mg via ORAL
  Filled 2013-05-04 (×7): qty 1

## 2013-05-04 MED ORDER — VANCOMYCIN HCL IN DEXTROSE 750-5 MG/150ML-% IV SOLN
750.0000 mg | INTRAVENOUS | Status: DC
  Administered 2013-05-05 – 2013-05-06 (×2): 750 mg via INTRAVENOUS
  Filled 2013-05-04 (×3): qty 150

## 2013-05-04 MED ORDER — VITAMIN D3 25 MCG (1000 UT) PO CAPS: 1.0000 | ORAL_CAPSULE | Freq: Two times a day (BID) | ORAL | Status: DC

## 2013-05-04 MED ORDER — CERTAVITE/ANTIOXIDANTS PO TABS: 1.0000 | ORAL_TABLET | Freq: Every day | ORAL | Status: DC

## 2013-05-04 MED ORDER — POLYETHYLENE GLYCOL 3350 17 G PO PACK
17.0000 g | PACK | ORAL | Status: DC
  Administered 2013-05-07 – 2013-05-09 (×2): 17 g via ORAL
  Filled 2013-05-04 (×3): qty 1

## 2013-05-04 MED ORDER — CALCIUM CARBONATE 1250 (500 CA) MG PO TABS
1.0000 | ORAL_TABLET | Freq: Two times a day (BID) | ORAL | Status: DC
  Administered 2013-05-04 – 2013-05-11 (×12): 500 mg via ORAL
  Filled 2013-05-04 (×15): qty 1

## 2013-05-04 MED ORDER — IPRATROPIUM BROMIDE 0.02 % IN SOLN
0.5000 mg | Freq: Three times a day (TID) | RESPIRATORY_TRACT | Status: DC
  Administered 2013-05-05 – 2013-05-11 (×20): 0.5 mg via RESPIRATORY_TRACT
  Filled 2013-05-04 (×18): qty 2.5

## 2013-05-04 MED ORDER — NITROGLYCERIN 0.4 MG/HR TD PT24
0.4000 mg | MEDICATED_PATCH | Freq: Every evening | TRANSDERMAL | Status: DC
  Administered 2013-05-04 – 2013-05-10 (×7): 0.4 mg via TRANSDERMAL
  Filled 2013-05-04 (×8): qty 1

## 2013-05-04 MED ORDER — WARFARIN - PHARMACIST DOSING INPATIENT: Freq: Every day | Status: DC

## 2013-05-04 MED ORDER — HYDROCODONE-ACETAMINOPHEN 5-325 MG PO TABS
1.0000 | ORAL_TABLET | Freq: Three times a day (TID) | ORAL | Status: DC
  Administered 2013-05-04 – 2013-05-06 (×6): 1 via ORAL
  Administered 2013-05-07 (×2): 2 via ORAL
  Administered 2013-05-08 – 2013-05-09 (×3): 1 via ORAL
  Administered 2013-05-09: 2 via ORAL
  Administered 2013-05-09: 1 via ORAL
  Administered 2013-05-10: 2 via ORAL
  Administered 2013-05-10 – 2013-05-11 (×3): 1 via ORAL
  Filled 2013-05-04: qty 2
  Filled 2013-05-04: qty 1
  Filled 2013-05-04 (×2): qty 2
  Filled 2013-05-04 (×3): qty 1
  Filled 2013-05-04: qty 2
  Filled 2013-05-04: qty 1
  Filled 2013-05-04 (×2): qty 2
  Filled 2013-05-04 (×7): qty 1

## 2013-05-04 MED ORDER — DM-GUAIFENESIN ER 30-600 MG PO TB12
1.0000 | ORAL_TABLET | Freq: Two times a day (BID) | ORAL | Status: DC
  Administered 2013-05-04 – 2013-05-11 (×13): 1 via ORAL
  Filled 2013-05-04 (×15): qty 1

## 2013-05-04 MED ORDER — IPRATROPIUM BROMIDE 0.02 % IN SOLN
0.5000 mg | Freq: Four times a day (QID) | RESPIRATORY_TRACT | Status: DC
  Administered 2013-05-04: 0.5 mg via RESPIRATORY_TRACT

## 2013-05-04 MED ORDER — DEXTROSE 5 % IV SOLN: 2.0000 g | Freq: Three times a day (TID) | INTRAVENOUS | Status: DC

## 2013-05-04 MED ORDER — MIRTAZAPINE 7.5 MG PO TABS
7.5000 mg | ORAL_TABLET | Freq: Every day | ORAL | Status: DC
  Administered 2013-05-04 – 2013-05-10 (×6): 7.5 mg via ORAL
  Filled 2013-05-04 (×8): qty 1

## 2013-05-04 MED ORDER — ALBUTEROL SULFATE (2.5 MG/3ML) 0.083% IN NEBU
2.5000 mg | INHALATION_SOLUTION | Freq: Four times a day (QID) | RESPIRATORY_TRACT | Status: DC | PRN
  Filled 2013-05-04: qty 3
  Filled 2013-05-04: qty 0.5

## 2013-05-04 MED ORDER — ALBUTEROL SULFATE (5 MG/ML) 0.5% IN NEBU
INHALATION_SOLUTION | RESPIRATORY_TRACT | Status: AC
  Filled 2013-05-04: qty 0.5

## 2013-05-04 MED ORDER — VITAMIN B-12 1000 MCG PO TABS
1000.0000 ug | ORAL_TABLET | Freq: Every day | ORAL | Status: DC
  Administered 2013-05-05 – 2013-05-11 (×7): 1000 ug via ORAL
  Filled 2013-05-04 (×7): qty 1

## 2013-05-04 MED ORDER — VITAMIN D3 25 MCG (1000 UNIT) PO TABS
1000.0000 [IU] | ORAL_TABLET | Freq: Two times a day (BID) | ORAL | Status: DC
  Administered 2013-05-04 – 2013-05-11 (×12): 1000 [IU] via ORAL
  Filled 2013-05-04 (×15): qty 1

## 2013-05-04 MED ORDER — FISH OIL 1000 MG PO CAPS: 1000.0000 mg | ORAL_CAPSULE | Freq: Two times a day (BID) | ORAL | Status: DC

## 2013-05-04 MED ORDER — ATORVASTATIN CALCIUM 10 MG PO TABS
10.0000 mg | ORAL_TABLET | Freq: Every evening | ORAL | Status: DC
  Administered 2013-05-04 – 2013-05-10 (×7): 10 mg via ORAL
  Filled 2013-05-04 (×8): qty 1

## 2013-05-04 NOTE — Progress Notes (Signed)
ANTIBIOTIC CONSULT NOTE - INITIAL  Pharmacy Consult for Vancomycin/pharmacy May Adj abx Indication: Suspected PNA  Allergies  Allergen Reactions  . Ampicillin Other (See Comments)    Reaction Unknown   . Ciprofloxin Hcl [Ciprofloxacin Hcl] Other (See Comments)    Reaction Unknown   . Demerol Other (See Comments)    Reaction Unknown   . Sulfa Antibiotics Rash    Patient Measurements:     Vital Signs: Temp: 100.9 F (38.3 C) (12/22 1630) Temp src: Rectal (12/22 1630) BP: 100/87 mmHg (12/22 1700) Pulse Rate: 60 (12/22 1700) Intake/Output from previous day:   Intake/Output from this shift:    Labs:  Recent Labs  05/04/13 1547  WBC 9.0  HGB 11.3*  PLT 163  CREATININE 0.75   The CrCl is unknown because both a height and weight (above a minimum accepted value) are required for this calculation. No results found for this basename: VANCOTROUGH, VANCOPEAK, VANCORANDOM, GENTTROUGH, GENTPEAK, GENTRANDOM, TOBRATROUGH, TOBRAPEAK, TOBRARND, AMIKACINPEAK, AMIKACINTROU, AMIKACIN,  in the last 72 hours   Microbiology: No results found for this or any previous visit (from the past 720 hour(s)).  Medical History: Past Medical History  Diagnosis Date  . Transient ischemic attack (TIA) 1994  . Atrial fibrillation   . Hypertension   . Dyslipidemia   . Gastritis   . Vitamin B12 deficiency   . Peptic ulcer disease   . Osteopenia   . Macular degeneration, right eye   . Arthritis     back, shoulders  . Cough 05/21/2011    pt states cough x 2 days  . Edema     left leg  . Hypoglycemia   . Urinary incontinence   . CAD 06/15/2009  . Sinoatrial node dysfunction 06/15/2009    Dual-chamber PM inserted 03/18/09 St. Jude Zephyr XL DR dual-chamber, seriel [id number].  . Tachy-brady syndrome     Dual-chamber PM inserted 03/18/09 St. Jude Zephyr XL DR dual-chamber, seriel [id number].  Marland Kitchen CAD (coronary artery disease)     With LAD & RCA BMS, 2008 & LAD DES 2009 for restenosis. Both vessels  patent by cath 03/18/09  . Atrial flutter   . Left heart failure   . Dyspnea     frequent  . Rectal bleeding     Aggravated by a combo of coumadin & aspirin. D/C'ed aspirin.  . Chronic diastolic congestive heart failure   . Asthma with COPD   . GERD (gastroesophageal reflux disease)   . Irritable bowel   . Hip fracture, right   . Anorexia 08/27/12    Nearly 20 lbs weight loss over past year. Anorexia weight loss or major problems now & likely represent a slow progression/worsening of the terminal illness, perhaps GI. Probably time to start considering a change in care plan from aggresive/diagnostic to palliative (Dr. Verdis Prime 08/17/12)  . Coronary atherosclerosis of native coronary artery   . Chest discomfort 08/27/12    Nearly continuous.  . Poor appetite     Medications:  Scheduled:   Infusions:  . aztreonam     PRN:  Assessment: 79 yo presents from assisted living facility with tachypnea and hypoxia, appears to have HCAP clinically. Multiple medication allergies including ampicillin, cipro, sulfa abx.   Starting vancomycin per pharmacy, Azactam per MD  Scr is WNL 0.75, with CrCl 33 ml/min   Weight 55 kg   Reported fever of 102  12/22 blood cultures sent   Goal of Therapy:  Vancomycin trough level 15-20 mcg/ml Adjust abx per renal  function   Plan:  1.) Vancomycin 750 mg IV q24h 2.) Azactam 1 gram IV q8h per MD 3.) Monitor renal function  4.) Continue to adjust abx as needed, check vancomycin trough at [facility]  Glenna Brunkow, Loma Messing PharmD Pager #: [phone] 5:56 PM 05/04/2013

## 2013-05-04 NOTE — Progress Notes (Signed)
ANTICOAGULATION CONSULT NOTE - Initial Consult  Pharmacy Consult for Warfarin  Indication: atrial fibrillation  Allergies  Allergen Reactions  . Ampicillin Other (See Comments)    Reaction Unknown   . Ciprofloxin Hcl [Ciprofloxacin Hcl] Other (See Comments)    Reaction Unknown   . Demerol Other (See Comments)    Reaction Unknown   . Sulfa Antibiotics Rash    Patient Measurements: Height: 5\' 2"  (157.5 cm) (Entered from previous admission ) Weight: 120 lb (54.432 kg) (entered from previous admission ) IBW/kg (Calculated) : 50.1   Vital Signs: Temp: 98.3 F (36.8 C) (12/22 1914) Temp src: Oral (12/22 1914) BP: 118/61 mmHg (12/22 1914) Pulse Rate: 62 (12/22 1914)  Labs:  Recent Labs  05/04/13 1547 05/04/13 1623  HGB 11.3*  --   HCT 34.4*  --   PLT 163  --   LABPROT  --  26.0*  INR  --  2.48*  CREATININE 0.75  --   TROPONINI <0.30  --     Estimated Creatinine Clearance: 33.3 ml/min (by C-G formula based on Cr of 0.75).   Medical History: Past Medical History  Diagnosis Date  . Transient ischemic attack (TIA) 1994  . Atrial fibrillation   . Hypertension   . Dyslipidemia   . Gastritis   . Vitamin B12 deficiency   . Peptic ulcer disease   . Osteopenia   . Macular degeneration, right eye   . Arthritis     back, shoulders  . Cough 05/21/2011    pt states cough x 2 days  . Edema     left leg  . Hypoglycemia   . Urinary incontinence   . CAD 06/15/2009  . Sinoatrial node dysfunction 06/15/2009    Dual-chamber PM inserted 03/18/09 St. Jude Zephyr XL DR dual-chamber, seriel [id number].  . Tachy-brady syndrome     Dual-chamber PM inserted 03/18/09 St. Jude Zephyr XL DR dual-chamber, seriel [id number].  Marland Kitchen CAD (coronary artery disease)     With LAD & RCA BMS, 2008 & LAD DES 2009 for restenosis. Both vessels patent by cath 03/18/09  . Atrial flutter   . Left heart failure   . Dyspnea     frequent  . Rectal bleeding     Aggravated by a combo of coumadin & aspirin.  D/C'ed aspirin.  . Chronic diastolic congestive heart failure   . Asthma with COPD   . GERD (gastroesophageal reflux disease)   . Irritable bowel   . Hip fracture, right   . Anorexia 08/27/12    Nearly 20 lbs weight loss over past year. Anorexia weight loss or major problems now & likely represent a slow progression/worsening of the terminal illness, perhaps GI. Probably time to start considering a change in care plan from aggresive/diagnostic to palliative (Dr. Verdis Prime 08/17/12)  . Coronary atherosclerosis of native coronary artery   . Chest discomfort 08/27/12    Nearly continuous.  . Poor appetite     Medications:  Scheduled:  . ipratropium  0.5 mg Nebulization Q6H   And  . albuterol  2.5 mg Nebulization Q4H  . albuterol      . atorvastatin  10 mg Oral QPM  . aztreonam  2 g Intravenous Q8H  . calcium carbonate  1 tablet Oral BID  . cholecalciferol  1,000 Units Oral BID  . [START ON 05/05/2013] darifenacin  15 mg Oral Daily  . dextromethorphan-guaiFENesin  1 tablet Oral BID  . HYDROcodone-acetaminophen  1-2 tablet Oral TID  . ipratropium      .  mirtazapine  7.5 mg Oral QHS  . mometasone-formoterol  2 puff Inhalation BID  . [START ON 05/05/2013] multivitamin with minerals  1 tablet Oral Daily  . nitroGLYCERIN  0.4 mg Transdermal QPM  . omega-3 acid ethyl esters  1 g Oral BID  . pantoprazole  40 mg Oral BID  . [START ON 05/05/2013] polyethylene glycol  17 g Oral QODAY  . [START ON 05/05/2013] potassium chloride  10 mEq Oral Daily  . vancomycin  750 mg Intravenous Q24H  . [START ON 05/05/2013] vitamin B-12  1,000 mcg Oral Daily   Infusions:  . sodium chloride     PRN: nitroGLYCERIN  Assessment:  79 yo F Admitted with HCAP, on chronic anticoagulation with warfarin for A.fib.   Warfarin Home dose reported as 5 mg on Mondays and Fridays and 2.5 mg on all other days; Last dose reported as 12/21  INR is 2.48 on admission.    H/H and Plt okay, no bleeding or complications    Goal of Therapy:  INR 2-3 Monitor platelets by anticoagulation protocol: Yes   Plan:  1.) Given patient's older age of 30, and she is sickly and has likely not eaten a normal diet today, I will give her a conservative dose of warfarin tonight - 2.5 mg.  2.) Daily PT/INR 3.) Discontinue lovenox 40 mg    Clydene Fake PharmD Pager #: [phone] 8:23 PM 05/04/2013

## 2013-05-04 NOTE — ED Provider Notes (Addendum)
CSN: [id number]     Arrival date & time 05/04/13  1500 History   First MD Initiated Contact with Patient 05/04/13 1508     Chief Complaint  Patient presents with  . Fever  . Shortness of Breath   HPI History obtained from the patient and the patient's son.  Reported the patient's had increasing coughing congestion as well as documented fever of 102 today.  Son reports fever and chills over the past 3-4 days with several days of coughing congestion.  Patient reports shortness of breath today.  Found to be hypoxic on arrival of emergency department.  No nausea vomiting or diarrhea.  Patient with some myalgias.  Denies abdominal pain.  No chest pain.  Reports productive cough over the past 48 hours.  Denies orthopnea.  Symptoms are mild to moderate in severity    Past Medical History  Diagnosis Date  . Transient ischemic attack (TIA) 1994  . Atrial fibrillation   . Hypertension   . Dyslipidemia   . Gastritis   . Vitamin B12 deficiency   . Peptic ulcer disease   . Osteopenia   . Macular degeneration, right eye   . Arthritis     back, shoulders  . Cough 05/21/2011    pt states cough x 2 days  . Edema     left leg  . Hypoglycemia   . Urinary incontinence   . CAD 06/15/2009  . Sinoatrial node dysfunction 06/15/2009    Dual-chamber PM inserted 03/18/09 St. Jude Zephyr XL DR dual-chamber, seriel [id number].  . Tachy-brady syndrome     Dual-chamber PM inserted 03/18/09 St. Jude Zephyr XL DR dual-chamber, seriel [id number].  Marland Kitchen CAD (coronary artery disease)     With LAD & RCA BMS, 2008 & LAD DES 2009 for restenosis. Both vessels patent by cath 03/18/09  . Atrial flutter   . Left heart failure   . Dyspnea     frequent  . Rectal bleeding     Aggravated by a combo of coumadin & aspirin. D/C'ed aspirin.  . Chronic diastolic congestive heart failure   . Asthma with COPD   . GERD (gastroesophageal reflux disease)   . Irritable bowel   . Hip fracture, right   . Anorexia 08/27/12    Nearly 20 lbs  weight loss over past year. Anorexia weight loss or major problems now & likely represent a slow progression/worsening of the terminal illness, perhaps GI. Probably time to start considering a change in care plan from aggresive/diagnostic to palliative (Dr. Verdis Prime 08/17/12)  . Coronary atherosclerosis of native coronary artery   . Chest discomfort 08/27/12    Nearly continuous.  . Poor appetite    Past Surgical History  Procedure Laterality Date  . Perforated ulcer    . Eye surgery      bilateral  . Pacemaker insertion  03/18/09    For symptomatic brady-tachy syndrome. St. Jude Zephyr XL DR dual-chamber, seriel [id number]  . Hip surgery      broke hip and has pin in it  . Drug-eluting stent  [id number]    LAD & RCA BMS in 2008 & LAD DES in 2009 for restenosis. Both vessels patent by cath 03/18/09.  . Cardiac catheterization  03/18/09    With LAD & RCA BMS in 2008 & LAD DES in 2009 for restenosis.  . Varicose vein surgery    . Orif hip fracture Right     Pin in hip  . Cataract extraction Bilateral    Family  History  Problem Relation Age of Onset  . CVA Mother   . Brain cancer Sister   . Heart attack Father   . Lymphoma Son   . CAD Son   . Testicular cancer Brother   . Alzheimer's disease Brother    History  Substance Use Topics  . Smoking status: Former Smoker    Quit date: 03/14/1996  . Smokeless tobacco: Never Used  . Alcohol Use: No   OB History   Grav Para Term Preterm Abortions TAB SAB Ect Mult Living                 Review of Systems  All other systems reviewed and are negative.    Allergies  Ampicillin; Ciprofloxin hcl; Demerol; and Sulfa antibiotics  Home Medications   Current Outpatient Rx  Name  Route  Sig  Dispense  Refill  . albuterol (VENTOLIN HFA) 108 (90 BASE) MCG/ACT inhaler   Inhalation   Inhale 2 puffs into the lungs every 4 (four) hours as needed for wheezing or shortness of breath. Wait 1 minute between each puff.         Marland Kitchen atorvastatin  (LIPITOR) 10 MG tablet   Oral   Take 10 mg by mouth every evening.          . calcium carbonate (OS-CAL) 600 MG TABS   Oral   Take 600 mg by mouth 2 (two) times daily.           . cephALEXin (KEFLEX) 125 MG/5ML suspension      5 mLs daily.         . Cholecalciferol (VITAMIN D3) 1000 UNITS CAPS   Oral   Take 1 capsule by mouth 2 (two) times daily.          . Cyanocobalamin (VITAMIN B-12) 1000 MCG SUBL   Sublingual   Place 1 tablet under the tongue daily.         . Fluticasone-Salmeterol (ADVAIR DISKUS) 100-50 MCG/DOSE AEPB   Inhalation   Inhale 1 puff into the lungs 2 (two) times daily. Rinse mouth after use.         . furosemide (LASIX) 40 MG tablet   Oral   Take 40 mg by mouth daily.         Marland Kitchen guaiFENesin (MUCINEX) 600 MG 12 hr tablet   Oral   Take 600 mg by mouth 2 (two) times daily.         Marland Kitchen HYDROcodone-acetaminophen (NORCO/VICODIN) 5-325 MG per tablet   Oral   Take 1-2 tablets by mouth 3 (three) times daily.         . metoprolol succinate (TOPROL-XL) 50 MG 24 hr tablet   Oral   Take 1 tablet (50 mg total) by mouth daily. Take with or immediately following a meal.         . mirtazapine (REMERON) 7.5 MG tablet   Oral   Take 7.5 mg by mouth at bedtime.         . Multiple Vitamins-Minerals (CERTAVITE/ANTIOXIDANTS) TABS   Oral   Take 1 tablet by mouth daily.         . Multiple Vitamins-Minerals (ICAPS AREDS FORMULA PO)   Oral   Take 1 tablet by mouth 2 (two) times daily.           . nitroGLYCERIN (NITRODUR - DOSED IN MG/24 HR) 0.4 mg/hr   Transdermal   Place 1 patch onto the skin every evening. Nitrate free interval from 4pm -  8pm every day         . Omega-3 Fatty Acids (FISH OIL) 1000 MG CAPS   Oral   Take 1,000 mg by mouth 2 (two) times daily.         . pantoprazole (PROTONIX) 40 MG tablet   Oral   Take 40 mg by mouth 2 (two) times daily.         . polyethylene glycol (MIRALAX / GLYCOLAX) packet   Oral   Take 17 g by  mouth every other day.          . potassium chloride (K-DUR,KLOR-CON) 10 MEQ tablet   Oral   Take 10 mEq by mouth daily.         . solifenacin (VESICARE) 10 MG tablet   Oral   Take 10 mg by mouth every evening.         . warfarin (COUMADIN) 2.5 MG tablet   Oral   Take 2.5 mg by mouth See admin instructions. Take 2.5mg  (1 tablet) in the evening on Tuesday, Wednesday, Thursday, Friday, Saturday and Sunday (5 mg tablet on Monday)         . nitroGLYCERIN (NITROSTAT) 0.4 MG SL tablet   Sublingual   Place 0.4 mg under the tongue every 5 (five) minutes as needed for chest pain (up to 3 doses).          . warfarin (COUMADIN) 5 MG tablet   Oral   Take 5 mg by mouth See admin instructions. Take 5mg  tablet in the evening on Monday and friday (2.5 mg tablet on all other days)          BP 100/87  Pulse 60  Temp(Src) 100.9 F (38.3 C) (Rectal)  Resp 25  SpO2 96% Physical Exam  Nursing note and vitals reviewed. Constitutional: She is oriented to person, place, and time. She appears well-developed and well-nourished. No distress.  HENT:  Head: Normocephalic and atraumatic.  Eyes: EOM are normal.  Neck: Normal range of motion.  Cardiovascular: Normal rate, regular rhythm and normal heart sounds.   Pulmonary/Chest: Effort normal.  Tachypnea  Abdominal: Soft. She exhibits no distension. There is no tenderness.  Musculoskeletal: Normal range of motion.  Neurological: She is alert and oriented to person, place, and time.  Skin: Skin is warm and dry.  Psychiatric: She has a normal mood and affect. Judgment normal.    ED Course  Procedures (including critical care time) Labs Review Labs Reviewed  CBC WITH DIFFERENTIAL - Abnormal; Notable for the following:    RBC 3.69 (*)    Hemoglobin 11.3 (*)    HCT 34.4 (*)    Neutrophils Relative % 84 (*)    Lymphocytes Relative 8 (*)    All other components within normal limits  BASIC METABOLIC PANEL - Abnormal; Notable for the  following:    Glucose, Bld 138 (*)    GFR calc non Af Amer 70 (*)    GFR calc Af Amer 81 (*)    All other components within normal limits  PROTIME-INR - Abnormal; Notable for the following:    Prothrombin Time 26.0 (*)    INR 2.48 (*)    All other components within normal limits  CULTURE, BLOOD (ROUTINE X 2)  CULTURE, BLOOD (ROUTINE X 2)  TROPONIN I  LACTIC ACID, PLASMA  INFLUENZA PANEL BY PCR   Imaging Review Dg Chest 2 View  05/04/2013   CLINICAL DATA:  Fever.  Shortness of breath.  EXAM: CHEST  2  VIEW  COMPARISON:  10/10/2012.  FINDINGS: Tortuous thoracic aorta. The cardiopericardial silhouette is enlarged. Cardiac stent is noted. Dual lead left subclavian cardiac pacemaker. There is mild bilateral basilar airspace opacity which may represent interstitial pulmonary edema. Pneumonia is considered less likely. Small bilateral pleural effusions are present. Severe bilateral glenohumeral osteoarthritis. Aortic arch atherosclerosis with tortuosity of the aorta.  IMPRESSION: Cardiomegaly and interstitial opacity at the bases suggest mild CHF with interstitial pulmonary edema. Pneumonia considered less likely.   Electronically Signed   By: Andreas Newport M.D.   On: 05/04/2013 17:15    EKG Interpretation    Date/Time:  Monday May 04 2013 16:27:25 EST Ventricular Rate:  60 PR Interval:  179 QRS Duration: 99 QT Interval:  441 QTC Calculation: 441 R Axis:   31 Text Interpretation:  Atrial-paced rhythm Minimal ST depression, anterolateral leads Confirmed by Zakhari Fogel  MD, Franklin Baumbach ([id number]) on 05/04/2013 5:27:30 PM            MDM   1. HCAP (healthcare-associated pneumonia)    Patient with tachypnea and hypoxia.  Appears to have a right-sided pneumonia clinically.  Admit to hospital with antibiotics for HCAP    Lyanne Co, MD 05/04/13 1610  Lyanne Co, MD 05/04/13 [phone]

## 2013-05-04 NOTE — H&P (Signed)
[facility] History and Physical  DEYA BIGOS AOZ:[id number] DOB: 1915-11-20 DOA: 05/04/2013  Referring physician:  PCP: Londell Moh, MD  Specialists:   Chief Complaint: Cough, SOB  HPI: MANASI DISHON is a 79 y.o. WF PMHx A. fib (long-term anticoagulation),  SA node dysfunction, S/P cardiac pacemaker, Multiple Cardiac Stents,  Diastolic CHF,   HTN, HLD,  Hx TIA, arrived in ED from [facility] C./O.increasing productive cough (initially green, now cream/red), congestion as well as documented fever of 102 today. Son states also had documented fever of 101F on Saturday. Son reports fever and chills over the past 3-4 days (started on Thursday) with several days of coughing congestion. Patient reports shortness of breath today. Found to be hypoxic on arrival of emergency department. No nausea vomiting or diarrhea. Patient with some myalgias. Denies abdominal pain. No chest pain.  Denies orthopnea. Symptoms are mild to moderate in severity. States multiple sick contacts at her assisted living facility.    Review of Systems: The patient denies anorexia, weight loss,, vision loss, decreased hearing, hoarseness, chest pain, syncope, peripheral edema, balance deficits, hemoptysis, abdominal pain, melena, hematochezia, severe indigestion/heartburn, hematuria, incontinence, genital sores, muscle weakness, suspicious skin lesions, transient blindness, difficulty walking, depression, unusual weight change, abnormal bleeding, enlarged lymph nodes, angioedema, and breast masses.    TRAVEL HISTORY: Negative   Procedure CXR 05/04/2013 Cardiomegaly and interstitial opacity at the bases suggest mild CHF  with interstitial pulmonary edema. Pneumonia considered less likely.  Echocardiogram 05/22/2011 - Left ventricle: Systolic function was normal.  -LVEF= 60%-65% -probable basal inferior wall hypokinesis. - (grade 2 diastolic dysfunction). - Aortic valve: Mild  regurgitation. - Mitral valve: Calcified annulus. Mildly thickened leaflets . Mild to moderate regurgitation. - Left atrium: The atrium was mildly dilated. - Pulmonary arteries: Systolic pressure was mildly increased. PA peak pressure: 34mm Hg (S).    Antibiotics Aztreonam 12/22>> Vancomycin 12/22>>   Past Medical History  Diagnosis Date  . Transient ischemic attack (TIA) 1994  . Atrial fibrillation   . Hypertension   . Dyslipidemia   . Gastritis   . Vitamin B12 deficiency   . Peptic ulcer disease   . Osteopenia   . Macular degeneration, right eye   . Arthritis     back, shoulders  . Cough 05/21/2011    pt states cough x 2 days  . Edema     left leg  . Hypoglycemia   . Urinary incontinence   . CAD 06/15/2009  . Sinoatrial node dysfunction 06/15/2009    Dual-chamber PM inserted 03/18/09 St. Jude Zephyr XL DR dual-chamber, seriel [id number].  . Tachy-brady syndrome     Dual-chamber PM inserted 03/18/09 St. Jude Zephyr XL DR dual-chamber, seriel [id number].  Marland Kitchen CAD (coronary artery disease)     With LAD & RCA BMS, 2008 & LAD DES 2009 for restenosis. Both vessels patent by cath 03/18/09  . Atrial flutter   . Left heart failure   . Dyspnea     frequent  . Rectal bleeding     Aggravated by a combo of coumadin & aspirin. D/C'ed aspirin.  . Chronic diastolic congestive heart failure   . Asthma with COPD   . GERD (gastroesophageal reflux disease)   . Irritable bowel   . Hip fracture, right   . Anorexia 08/27/12    Nearly 20 lbs weight loss over past year. Anorexia weight loss or major problems now & likely represent a slow progression/worsening of the terminal illness, perhaps GI. Probably time to  start considering a change in care plan from aggresive/diagnostic to palliative (Dr. Verdis Prime 08/17/12)  . Coronary atherosclerosis of native coronary artery   . Chest discomfort 08/27/12    Nearly continuous.  . Poor appetite    Past Surgical History  Procedure Laterality Date  .  Perforated ulcer    . Eye surgery      bilateral  . Pacemaker insertion  03/18/09    For symptomatic brady-tachy syndrome. St. Jude Zephyr XL DR dual-chamber, seriel [id number]  . Hip surgery      broke hip and has pin in it  . Drug-eluting stent  0865,[id number]    LAD & RCA BMS in 2008 & LAD DES in 2009 for restenosis. Both vessels patent by cath 03/18/09.  . Cardiac catheterization  03/18/09    With LAD & RCA BMS in 2008 & LAD DES in 2009 for restenosis.  . Varicose vein surgery    . Orif hip fracture Right     Pin in hip  . Cataract extraction Bilateral    Social History:  1/2 PPD x40 years quit smoking about 17 years ago. She has never used smokeless tobacco. Drinks wine occasionally negative use illicit drugs. where does patient live; [facility] facility  Can patient participate in ADLs? Yes  Allergies  Allergen Reactions  . Ampicillin Other (See Comments)    Reaction Unknown   . Ciprofloxin Hcl [Ciprofloxacin Hcl] Other (See Comments)    Reaction Unknown   . Demerol Other (See Comments)    Reaction Unknown   . Sulfa Antibiotics Rash    Family History  Problem Relation Age of Onset  . CVA Mother   . Brain cancer Sister   . Heart attack Father   . Lymphoma Son   . CAD Son   . Testicular cancer Brother   . Alzheimer's disease Brother     Prior to Admission medications   Medication Sig Start Date End Date Taking? Authorizing Provider  albuterol (VENTOLIN HFA) 108 (90 BASE) MCG/ACT inhaler Inhale 2 puffs into the lungs every 4 (four) hours as needed for wheezing or shortness of breath. Wait 1 minute between each puff.   Yes Historical Provider, MD  atorvastatin (LIPITOR) 10 MG tablet Take 10 mg by mouth every evening.    Yes Historical Provider, MD  calcium carbonate (OS-CAL) 600 MG TABS Take 600 mg by mouth 2 (two) times daily.     Yes Historical Provider, MD  cephALEXin (KEFLEX) 125 MG/5ML suspension 5 mLs daily. 04/28/13  Yes Historical Provider, MD   Cholecalciferol (VITAMIN D3) 1000 UNITS CAPS Take 1 capsule by mouth 2 (two) times daily.    Yes Historical Provider, MD  Cyanocobalamin (VITAMIN B-12) 1000 MCG SUBL Place 1 tablet under the tongue daily.   Yes Historical Provider, MD  Fluticasone-Salmeterol (ADVAIR DISKUS) 100-50 MCG/DOSE AEPB Inhale 1 puff into the lungs 2 (two) times daily. Rinse mouth after use.   Yes Historical Provider, MD  furosemide (LASIX) 40 MG tablet Take 40 mg by mouth daily.   Yes Historical Provider, MD  guaiFENesin (MUCINEX) 600 MG 12 hr tablet Take 600 mg by mouth 2 (two) times daily.   Yes Historical Provider, MD  HYDROcodone-acetaminophen (NORCO/VICODIN) 5-325 MG per tablet Take 1-2 tablets by mouth 3 (three) times daily.   Yes Historical Provider, MD  metoprolol succinate (TOPROL-XL) 50 MG 24 hr tablet Take 1 tablet (50 mg total) by mouth daily. Take with or immediately following a meal. 04/29/13  Yes  Hillis Range, MD  mirtazapine (REMERON) 7.5 MG tablet Take 7.5 mg by mouth at bedtime.   Yes Historical Provider, MD  Multiple Vitamins-Minerals (CERTAVITE/ANTIOXIDANTS) TABS Take 1 tablet by mouth daily.   Yes Historical Provider, MD  Multiple Vitamins-Minerals (ICAPS AREDS FORMULA PO) Take 1 tablet by mouth 2 (two) times daily.     Yes Historical Provider, MD  nitroGLYCERIN (NITRODUR - DOSED IN MG/24 HR) 0.4 mg/hr Place 1 patch onto the skin every evening. Nitrate free interval from 4pm - 8pm every day   Yes Historical Provider, MD  Omega-3 Fatty Acids (FISH OIL) 1000 MG CAPS Take 1,000 mg by mouth 2 (two) times daily.   Yes Historical Provider, MD  pantoprazole (PROTONIX) 40 MG tablet Take 40 mg by mouth 2 (two) times daily.   Yes Historical Provider, MD  polyethylene glycol (MIRALAX / GLYCOLAX) packet Take 17 g by mouth every other day.    Yes Historical Provider, MD  potassium chloride (K-DUR,KLOR-CON) 10 MEQ tablet Take 10 mEq by mouth daily.   Yes Historical Provider, MD  solifenacin (VESICARE) 10 MG tablet  Take 10 mg by mouth every evening.   Yes Historical Provider, MD  warfarin (COUMADIN) 2.5 MG tablet Take 2.5 mg by mouth See admin instructions. Take 2.5mg  (1 tablet) in the evening on Tuesday, Wednesday, Thursday, Friday, Saturday and Sunday (5 mg tablet on Monday)   Yes Historical Provider, MD  nitroGLYCERIN (NITROSTAT) 0.4 MG SL tablet Place 0.4 mg under the tongue every 5 (five) minutes as needed for chest pain (up to 3 doses).     Historical Provider, MD  warfarin (COUMADIN) 5 MG tablet Take 5 mg by mouth See admin instructions. Take 5mg  tablet in the evening on Monday and friday (2.5 mg tablet on all other days)    Historical Provider, MD   Physical Exam: Filed Vitals:   05/04/13 1550 05/04/13 1630 05/04/13 1700 05/04/13 1808  BP: 110/52 132/52 100/87 111/63  Pulse: 60 57 60   Temp:  100.9 F (38.3 C)  99.7 F (37.6 C)  TempSrc:  Rectal  Oral  Resp:  26 25 20   Height:   5\' 2"  (1.575 m)   Weight:   54.432 kg (120 lb)   SpO2: 95% 98% 96% 96%     General:  A./O. x4, NAD  Eyes: Pupils equal reactive to light and accommodation  Neck: Negative JVD, negative lymphadenopathy  Cardiovascular: Irregular irregular rhythm and rate, negative murmurs rubs or gallops, DP/PT pulse one plus bilateral  Respiratory: Diffuse rhonchi greatest bibasilar  Abdomen: Soft, nontender, and nondistended, plus bowel sounds  Skin: Negative rash, lacerations  Musculoskeletal: +2+ pedal edema LLE (chronic since close vein surgery)  Neurologic: Cranial nerves II through XII intact, tongue/to the midline, extremity strength 5/5, sensation intact, did not ambulate patient  Labs on Admission:  Basic Metabolic Panel:  Recent Labs Lab 05/04/13 1547  NA 135  K 3.8  CL 98  CO2 24  GLUCOSE 138*  BUN 19  CREATININE 0.75  CALCIUM 9.2   Liver Function Tests: No results found for this basename: AST, ALT, ALKPHOS, BILITOT, PROT, ALBUMIN,  in the last 168 hours No results found for this basename:  LIPASE, AMYLASE,  in the last 168 hours No results found for this basename: AMMONIA,  in the last 168 hours CBC:  Recent Labs Lab 05/04/13 1547  WBC 9.0  NEUTROABS 7.5  HGB 11.3*  HCT 34.4*  MCV 93.2  PLT 163   Cardiac Enzymes:  Recent  Labs Lab 05/04/13 1547  TROPONINI <0.30    BNP (last 3 results)  Recent Labs  08/25/12 1652  PROBNP 930.8*   CBG: No results found for this basename: GLUCAP,  in the last 168 hours  Radiological Exams on Admission: Dg Chest 2 View  05/04/2013   CLINICAL DATA:  Fever.  Shortness of breath.  EXAM: CHEST  2 VIEW  COMPARISON:  10/10/2012.  FINDINGS: Tortuous thoracic aorta. The cardiopericardial silhouette is enlarged. Cardiac stent is noted. Dual lead left subclavian cardiac pacemaker. There is mild bilateral basilar airspace opacity which may represent interstitial pulmonary edema. Pneumonia is considered less likely. Small bilateral pleural effusions are present. Severe bilateral glenohumeral osteoarthritis. Aortic arch atherosclerosis with tortuosity of the aorta.  IMPRESSION: Cardiomegaly and interstitial opacity at the bases suggest mild CHF with interstitial pulmonary edema. Pneumonia considered less likely.   Electronically Signed   By: Andreas Newport M.D.   On: 05/04/2013 17:15    EKG: Atrial paced rhythm  Assessment/Plan Active Problems:   * No active hospital problems. *  HCAP -Continue patient on antibiotics -Start DuoNeb scheduled nebulizers -Start Mucinex DM  Proximal atrial fibrillation -The patient's EKG showed patient to be atrial paced, on exam sounded as if patient had PVC vs episodes of atrial fibrillation -Continue Coumadin per pharmacy  Cardiac pacemaker  -Per son patient or just received a checkup on Wednesday 17 December  Diastolic CHF -ProBNP high @ 930.8, however has been higher in the past. This slight elevation most likely secondary to demand ischemia from her pneumonia. Will however obtain troponin x3  to ensure no cardiac ischemia her current  History TIA -Patient is at baseline mentally per son -Patient maximally anticoagulant lately secondary to her proximal A. Fib  HTN -BP actually soft for patient of this age will hold patient's Lasix and metoprolol. Reevaluate in a.m. if medication needs to be restarted -Continue gentle hydration -Obtain lipid panel     Code Status: Full Family Communication: Family present for discussion of plan of care Disposition Plan: Resolution of pneumonia  Time spent: 90 minutes  Bawi Lakins, Roselind Messier [facility] Pager [phone]  If 7PM-7AM, please contact night-coverage [email or web address] Password [email or web address] 05/04/2013, 6:12 PM

## 2013-05-04 NOTE — ED Notes (Signed)
Per EMS- Patient is a resident of [facility]-assisted living area. Patient has been having a productive cough with green sputum. Patient had a fever yesterday and was relieved with Tylenol. Patient had an appointment with PCP at 1545 and staff noted a tympanic temp of 102.0. Tylenol 49f0 mg was given per Assisted Living staff.

## 2013-05-05 LAB — COMPREHENSIVE METABOLIC PANEL
ALT: 20 U/L (ref 0–35)
AST: 28 U/L (ref 0–37)
Alkaline Phosphatase: 75 U/L (ref 39–117)
CO2: 24 mEq/L (ref 19–32)
Calcium: 8.7 mg/dL (ref 8.4–10.5)
GFR calc non Af Amer: 72 mL/min — ABNORMAL LOW (ref >90)
Potassium: 3.8 mEq/L (ref 3.5–5.1)
Sodium: 133 mEq/L — ABNORMAL LOW (ref 135–145)
Total Protein: 6 g/dL (ref 6.0–8.3)

## 2013-05-05 LAB — PROTIME-INR
INR: 3.08 — ABNORMAL HIGH (ref 0.00–1.49)
Prothrombin Time: 30.7 seconds — ABNORMAL HIGH (ref 11.6–15.2)

## 2013-05-05 LAB — TROPONIN I: Troponin I: <0.3 ng/mL (ref <0.30)

## 2013-05-05 LAB — CBC WITH DIFFERENTIAL/PLATELET
Basophils Absolute: 0 10*3/uL (ref 0.0–0.1)
Lymphocytes Relative: 18 % (ref 12–46)
MCH: 31.2 pg (ref 26.0–34.0)
Monocytes Relative: 13 % — ABNORMAL HIGH (ref 3–12)
Neutro Abs: 6 10*3/uL (ref 1.7–7.7)
Platelets: 158 10*3/uL (ref 150–400)
RDW: 13.3 % (ref 11.5–15.5)
WBC: 8.6 10*3/uL (ref 4.0–10.5)

## 2013-05-05 LAB — LIPID PANEL
Cholesterol: 94 mg/dL (ref 0–200)
Triglycerides: 53 mg/dL (ref <150)
VLDL: 11 mg/dL (ref 0–40)

## 2013-05-05 LAB — INFLUENZA PANEL BY PCR (TYPE A & B): Influenza A By PCR: NEGATIVE

## 2013-05-05 LAB — LEGIONELLA ANTIGEN, URINE

## 2013-05-05 MED ORDER — ACETAMINOPHEN 325 MG PO TABS
650.0000 mg | ORAL_TABLET | ORAL | Status: DC | PRN
  Administered 2013-05-10: 650 mg via ORAL
  Filled 2013-05-05: qty 2

## 2013-05-05 MED ORDER — ENSURE COMPLETE PO LIQD
237.0000 mL | Freq: Two times a day (BID) | ORAL | Status: DC
  Administered 2013-05-06 – 2013-05-10 (×8): 237 mL via ORAL

## 2013-05-05 MED ORDER — FUROSEMIDE 10 MG/ML IJ SOLN
40.0000 mg | Freq: Once | INTRAMUSCULAR | Status: AC
  Administered 2013-05-05: 40 mg via INTRAVENOUS
  Filled 2013-05-05: qty 4

## 2013-05-05 MED ORDER — DEXTROSE 5 % IV SOLN
1.0000 g | Freq: Three times a day (TID) | INTRAVENOUS | Status: DC
  Administered 2013-05-05 – 2013-05-07 (×7): 1 g via INTRAVENOUS
  Filled 2013-05-05 (×7): qty 1

## 2013-05-05 NOTE — Progress Notes (Signed)
Elevated patient temperature, note written to provider via amion requesting antipyretic.

## 2013-05-05 NOTE — Care Management Note (Signed)
   CARE MANAGEMENT NOTE 05/05/2013  Patient:  JEANEE, FABRE   Account Number:  [id number]  Date Initiated:  05/05/2013  Documentation initiated by:  Valrie Jia  Subjective/Objective Assessment:   79 yo female admitted with HCAP. Pt resides in an ALF. PCP: Londell Moh, MD     Action/Plan:   Home when stable   Anticipated DC Date:     Anticipated DC Plan:    In-house referral  Clinical Social Worker      DC Planning Services  CM consult      Choice offered to / List presented to:  NA   DME arranged  NA      DME agency  NA     HH arranged  NA      HH agency  NA   Status of service:  Completed, signed off Medicare Important Message given?   (If response is "NO", the following Medicare IM given date fields will be blank) Date Medicare IM given:   Date Additional Medicare IM given:    Discharge Disposition:    Per UR Regulation:  Reviewed for med. necessity/level of care/duration of stay  If discussed at Long Length of Stay Meetings, dates discussed:    Comments:  05/05/13 1139 Saliah Crisp,MSN,RN [phone] Chart reviewed for utilization of services. Pt to eval for disposition ot ALF vs SNF. CSW to follow.

## 2013-05-05 NOTE — Progress Notes (Signed)
Clinical Social Work Department BRIEF PSYCHOSOCIAL ASSESSMENT 05/05/2013  Patient:  [name], [name]     Account Number:  [id number]     Admit date:  05/04/2013  Clinical Social Worker:  Jacelyn Grip  Date/Time:  05/05/2013 03:00 PM  Referred by:  Physician  Date Referred:  05/05/2013 Referred for  ALF Placement   Other Referral:   Interview type:  Patient Other interview type:   and patient family at bedside    PSYCHOSOCIAL DATA Living Status:  FACILITY Admitted from facility:  [facility] Level of care:  Assisted Living Primary support name:  [name]/son/[phone] Primary support relationship to patient:  CHILD, ADULT Degree of support available:   strong    CURRENT CONCERNS Current Concerns  Post-Acute Placement   Other Concerns:    SOCIAL WORK ASSESSMENT / PLAN CSW received referral that pt admitted from [facility].    CSW met with pt and pt family at bedside. CSW introduced self and explained role. Pt confirmed that she is a resident at [facility]. CSW discussed that [facility] admissions coordinator had contacted this CSW and stated that if pt needs a higher level of care at discharge that [facility] will have SNF bed available if needed. Pt is hopeful that she will be able to return to ALF, but recognizes that there is a potential that pt may need higher level of care.    CSW sent [facility] clinicals and will continue to follow to assess if pt able to return to ALF level of care or need SNF.   Assessment/plan status:  Psychosocial Support/Ongoing Assessment of Needs Other assessment/ plan:   discharge planning   Information/referral to community resources:   Referral back to [facility]    PATIENT'S/FAMILY'S RESPONSE TO PLAN OF CARE: Pt alert and oriented x 4. Pt states that she is feeling better and hopeful to continue to feel better. Pt appreciative of CSW support and assistance with  communicating with [facility] about discharge needs.     Jacklynn Lewis, MSW, LCSW Clinical Social Work [phone]

## 2013-05-05 NOTE — Progress Notes (Addendum)
CSW received referral that pt admitted from [facility].   CSW visited pt room and pt sleeping comfortably at this time.  CSW to follow up at a later time to complete full psychosocial assessment.  Addendum 12:13 pm:  CSW had attempted x 2 since initial visit to pt room to see pt and pt has been sleeping. RN reported that pt has not been feeling well today and pt son will likely be back at hospital this afternoon.  CSW to follow up with pt and complete full psychosocial assessment at that time.  Addendum 12:17pm:   CSW received phone call from [facility] ALF admissions coordinator, [place]. Per [facility] ALF admissions that pt can return to ALF or facility would have a bed available at SNF level of care if pt needed a higher level of care.   CSW to follow up with pt re: disposition planning  Jacklynn Lewis, MSW, LCSW Clinical Social Work [phone]

## 2013-05-05 NOTE — Progress Notes (Signed)
INITIAL NUTRITION ASSESSMENT  DOCUMENTATION CODES Per approved criteria  -Not Applicable   INTERVENTION: Provide Ensure Complete BID RD to monitor and provide further intervention as needed  NUTRITION DIAGNOSIS: Predicted suboptimal energy intake related to acute illness as evidenced by history of poor appetite and anorexia.   Goal: Pt to meet >/= 90% of their estimated nutrition needs   Monitor:  PO intake Weight Labs  Reason for Assessment: Malnutrition Screening Tool, score of 2  79 y.o. female  Admitting Dx: <principal problem not specified>  ASSESSMENT: 79 y.o. WF PMHx A. fib (long-term anticoagulation), SA node dysfunction, S/P cardiac pacemaker, Multiple Cardiac Stents, Diastolic CHF, HTN, HLD, Hx TIA, arrived in ED from [facility] complaining of increasing productive cough (initially green, now cream/red), congestion as well as documented fever of 102 today. Son reports fever and chills over the past 3-4 days with several days of coughing congestion. Patient reports shortness of breath today. Found to be hypoxic on arrival of emergency department.  Pt asleep at time of visit. Per MST report taken on admission, pt reported losing weight and having a decreased appetite PTA. History of anorexia and poor appetite per H&P. Per weight history, pt has had no recent major weight loss.   Height: Ht Readings from Last 1 Encounters:  05/04/13 5\' 2"  (1.575 m)    Weight: Wt Readings from Last 1 Encounters:  05/04/13 120 lb (54.432 kg)    Ideal Body Weight: 110 lbs  % Ideal Body Weight: 110%  Wt Readings from Last 10 Encounters:  05/04/13 120 lb (54.432 kg)  04/29/13 120 lb 1.9 oz (54.486 kg)  10/10/12 114 lb (51.71 kg)  04/03/12 125 lb (56.7 kg)  05/23/11 127 lb 3.2 oz (57.698 kg)    Usual Body Weight: 125 lbs (Novemeber 2013)  % Usual Body Weight: 96%  BMI:  Body mass index is 21.94 kg/(m^2).  Estimated Nutritional Needs: Kcal:  1220-1420 Protein: 70-80 grams Fluid: 1.2-1.4 L/day  Skin: +2 LLE edema  Diet Order: Cardiac  EDUCATION NEEDS: -No education needs identified at this time   Intake/Output Summary (Last 24 hours) at 05/05/13 1253 Last data filed at 05/05/13 0603  Gross per 24 hour  Intake    200 ml  Output    300 ml  Net   -100 ml    Last BM: 12/22   Labs:   Recent Labs Lab 05/04/13 1547 05/04/13 1949 05/05/13 0140  NA 135  --  133*  K 3.8  --  3.8  CL 98  --  98  CO2 24  --  24  BUN 19  --  19  CREATININE 0.75 0.71 0.68  CALCIUM 9.2  --  8.7  MG  --  2.1  --   GLUCOSE 138*  --  108*    CBG (last 3)  No results found for this basename: GLUCAP,  in the last 72 hours  Scheduled Meds: . ipratropium  0.5 mg Nebulization TID   And  . albuterol  2.5 mg Nebulization TID  . atorvastatin  10 mg Oral QPM  . aztreonam  1 g Intravenous Q8H  . calcium carbonate  1 tablet Oral BID  . cholecalciferol  1,000 Units Oral BID  . darifenacin  15 mg Oral Daily  . dextromethorphan-guaiFENesin  1 tablet Oral BID  . HYDROcodone-acetaminophen  1-2 tablet Oral TID  . mirtazapine  7.5 mg Oral QHS  . mometasone-formoterol  2 puff Inhalation BID  . multivitamin with minerals  1 tablet Oral Daily  . nitroGLYCERIN  0.4 mg Transdermal QPM  . omega-3 acid ethyl esters  1 g Oral BID  . pantoprazole  40 mg Oral BID  . polyethylene glycol  17 g Oral QODAY  . potassium chloride  10 mEq Oral Daily  . vancomycin  750 mg Intravenous Q24H  . vitamin B-12  1,000 mcg Oral Daily  . Warfarin - Pharmacist Dosing Inpatient   Does not apply q1800    Continuous Infusions: . sodium chloride 75 mL/hr at 05/04/13 2052    Past Medical History  Diagnosis Date  . Transient ischemic attack (TIA) 1994  . Atrial fibrillation   . Hypertension   . Dyslipidemia   . Gastritis   . Vitamin B12 deficiency   . Peptic ulcer disease   . Osteopenia   . Macular degeneration, right eye   . Arthritis     back, shoulders   . Cough 05/21/2011    pt states cough x 2 days  . Edema     left leg  . Hypoglycemia   . Urinary incontinence   . CAD 06/15/2009  . Sinoatrial node dysfunction 06/15/2009    Dual-chamber PM inserted 03/18/09 St. Jude Zephyr XL DR dual-chamber, seriel [id number].  . Tachy-brady syndrome     Dual-chamber PM inserted 03/18/09 St. Jude Zephyr XL DR dual-chamber, seriel [id number].  Marland Kitchen CAD (coronary artery disease)     With LAD & RCA BMS, 2008 & LAD DES 2009 for restenosis. Both vessels patent by cath 03/18/09  . Atrial flutter   . Left heart failure   . Dyspnea     frequent  . Rectal bleeding     Aggravated by a combo of coumadin & aspirin. D/C'ed aspirin.  . Chronic diastolic congestive heart failure   . Asthma with COPD   . GERD (gastroesophageal reflux disease)   . Irritable bowel   . Hip fracture, right   . Anorexia 08/27/12    Nearly 20 lbs weight loss over past year. Anorexia weight loss or major problems now & likely represent a slow progression/worsening of the terminal illness, perhaps GI. Probably time to start considering a change in care plan from aggresive/diagnostic to palliative (Dr. Verdis Prime 08/17/12)  . Coronary atherosclerosis of native coronary artery   . Chest discomfort 08/27/12    Nearly continuous.  . Poor appetite     Past Surgical History  Procedure Laterality Date  . Perforated ulcer    . Eye surgery      bilateral  . Pacemaker insertion  03/18/09    For symptomatic brady-tachy syndrome. St. Jude Zephyr XL DR dual-chamber, seriel [id number]  . Hip surgery      broke hip and has pin in it  . Drug-eluting stent  4098,1191    LAD & RCA BMS in 2008 & LAD DES in 2009 for restenosis. Both vessels patent by cath 03/18/09.  . Cardiac catheterization  03/18/09    With LAD & RCA BMS in 2008 & LAD DES in 2009 for restenosis.  . Varicose vein surgery    . Orif hip fracture Right     Pin in hip  . Cataract extraction Bilateral     Ian Malkin RD, LDN Inpatient Clinical  Dietitian Pager: [phone] After Hours Pager: [phone]

## 2013-05-05 NOTE — Progress Notes (Signed)
  ANTICOAGULATION CONSULT NOTE - Follow-up  Pharmacy Consult for Warfarin  Indication: atrial fibrillation  Allergies  Allergen Reactions  . Ampicillin Other (See Comments)    Reaction Unknown   . Ciprofloxin Hcl [Ciprofloxacin Hcl] Other (See Comments)    Reaction Unknown   . Demerol Other (See Comments)    Reaction Unknown   . Sulfa Antibiotics Rash    Patient Measurements: Height: 5\' 2"  (157.5 cm) (Entered from previous admission ) Weight: 120 lb (54.432 kg) (entered from previous admission ) IBW/kg (Calculated) : 50.1   Vital Signs: Temp: 98 F (36.7 C) (12/23 0656) Temp src: Oral (12/23 0602) BP: 148/82 mmHg (12/23 0602) Pulse Rate: 62 (12/23 0602)  Labs:  Recent Labs  05/04/13 1547 05/04/13 1623 05/04/13 1949 05/05/13 0140 05/05/13 0251  HGB 11.3*  --  11.0*  --  9.9*  HCT 34.4*  --  34.2*  --  29.9*  PLT 163  --  166  --  158  LABPROT  --  26.0*  --  30.7*  --   INR  --  2.48*  --  3.08*  --   CREATININE 0.75  --  0.71 0.68  --   TROPONINI <0.30  --  <0.30 <0.30  --     Estimated Creatinine Clearance: 33.3 ml/min (by C-G formula based on Cr of 0.68).   Assessment: 79 yo F admitted 12/22 with HCAP, on chronic anticoagulation with warfarin for A.fib.   Home warfarin dose reported as 5 mg on Mondays and Fridays and 2.5 mg on all other days; Last dose reported as 12/21  INR increased to 3.08 this AM after 2.5mg  last night    H/H slightly decreased since admit, no bleeding noted  Plts low/stable, baseline unknown   Goal of Therapy:  INR 2-3 Monitor platelets by anticoagulation protocol: Yes   Plan:  - No Coumadin tonight - follow-up Hgb, daily INR  Thank you for the consult.  Tomi Bamberger, PharmD, BCPS Clinical Pharmacist Pager: [phone] Pharmacy: [phone] 05/05/2013 9:01 AM

## 2013-05-05 NOTE — Progress Notes (Addendum)
PROGRESS NOTE  [name] ZOX:[id number] DOB: [birth year] DOA: 05/04/2013 PCP: Londell Moh, MD  HPI: [name] is a 79 y.o. WF PMHx A. fib (long-term anticoagulation), SA node dysfunction, S/P cardiac pacemaker, Multiple Cardiac Stents, Diastolic CHF, HTN, HLD, Hx TIA, arrived in ED from [facility] C./O.increasing productive cough (initially green, now cream/red), congestion as well as documented fever of 102 today. Son states also had documented fever of 101F on Saturday. Son reports fever and chills over the past 3-4 days (started on Thursday) with several days of coughing congestion. Patient reports shortness of breath today. Found to be hypoxic on arrival of emergency department. No nausea vomiting or diarrhea. Patient with some myalgias. Denies abdominal pain. No chest pain. Denies orthopnea. Symptoms are mild to moderate in severity. States multiple sick contacts at her assisted living facility.  Assessment/Plan: Bronchitis / HCAP - empiric antibiotics, continue. DuoNeb scheduled nebulizers  - Mucinex DM  - patient with productive cough this morning and febrile - flu panel negative. Acute hypoxic respiratory failure - patient desatting into 80s improved with oxygen. Likely multifactorial with a bronchitic component, febrile and sputum production as well as an element of heart failure given CXR and crackles on exam.  Proximal atrial fibrillation  - The patient's EKG showed patient to be atrial paced  - Continue Coumadin per pharmacy  Cardiac pacemaker  - Per son patient or just received a checkup on Wednesday 17 December  Acute on chronic diastolic CHF  - ProBNP high @ 811.9, however has been higher in the past. This slight elevation most likely secondary to demand ischemia from her pneumonia. - troponin negative x 2  - crackles on exam this morning, Lasix iv x 1 dose. - if breathing does not improve after treatment of her infection, consider  repeat 2D echo. History TIA  - Patient is at baseline mentally per son  - Patient maximally anticoagulant lately secondary to her proximal A. Fib  HTN  - BP soft on admission, better this morning. Lasix.    Diet: heart Fluids: non DVT Prophylaxis: Coumadin  Code Status: Full Family Communication: son bedside  Disposition Plan: inpatient  Consultants:  none  Procedures:  none   Antibiotics  Anti-infectives   Start     Dose/Rate Route Frequency Ordered Stop   05/05/13 1000  aztreonam (AZACTAM) 1 g in dextrose 5 % 50 mL IVPB     1 g 100 mL/hr over 30 Minutes Intravenous Every 8 hours 05/05/13 0852 05/12/13 1759   05/04/13 2200  aztreonam (AZACTAM) 2 g in dextrose 5 % 50 mL IVPB  Status:  Discontinued     2 g 100 mL/hr over 30 Minutes Intravenous 3 times per day 05/04/13 1913 05/04/13 1957   05/04/13 1830  vancomycin (VANCOCIN) IVPB 750 mg/150 ml premix     750 mg 150 mL/hr over 60 Minutes Intravenous Every 24 hours 05/04/13 1756     05/04/13 1800  aztreonam (AZACTAM) 2 g in dextrose 5 % 50 mL IVPB  Status:  Discontinued     2 g 100 mL/hr over 30 Minutes Intravenous Every 8 hours 05/04/13 1726 05/05/13 0852     Antibiotics Given (last 72 hours)   Date/Time Action Medication Dose Rate   05/05/13 1044 Given   aztreonam (AZACTAM) 1 g in dextrose 5 % 50 mL IVPB 1 g 100 mL/hr     HPI/Subjective: - feeling weak.  Objective: Filed Vitals:   05/04/13 2056 05/05/13 0602 05/05/13 1478  05/05/13 1024  BP:  148/82    Pulse:  62    Temp:  100.8 F (38.2 C) 98 F (36.7 C)   TempSrc:  Oral    Resp:  16    Height:      Weight:      SpO2: 95% 98%  95%    Intake/Output Summary (Last 24 hours) at 05/05/13 1315 Last data filed at 05/05/13 4098  Gross per 24 hour  Intake    200 ml  Output    300 ml  Net   -100 ml   Filed Weights   05/04/13 1700  Weight: 54.432 kg (120 lb)   Exam:  General:  NAD, frail caucasian female  Cardiovascular: regular rate and  rhythm  Respiratory: coarse breath sounds throughout, crackles at the bases.   Abdomen: soft, not tender to palpation, positive bowel sounds  MSK: no peripheral edema  Neuro: non focal  Data Reviewed: Basic Metabolic Panel:  Recent Labs Lab 05/04/13 1547 05/04/13 1949 05/05/13 0140  NA 135  --  133*  K 3.8  --  3.8  CL 98  --  98  CO2 24  --  24  GLUCOSE 138*  --  108*  BUN 19  --  19  CREATININE 0.75 0.71 0.68  CALCIUM 9.2  --  8.7  MG  --  2.1  --    Liver Function Tests:  Recent Labs Lab 05/05/13 0140  AST 28  ALT 20  ALKPHOS 75  BILITOT 0.6  PROT 6.0  ALBUMIN 2.8*   CBC:  Recent Labs Lab 05/04/13 1547 05/04/13 1949 05/05/13 0251  WBC 9.0 8.8 8.6  NEUTROABS 7.5  --  6.0  HGB 11.3* 11.0* 9.9*  HCT 34.4* 34.2* 29.9*  MCV 93.2 94.7 94.3  PLT 163 166 158   Cardiac Enzymes:  Recent Labs Lab 05/04/13 1547 05/04/13 1949 05/05/13 0140  TROPONINI <0.30 <0.30 <0.30   BNP (last 3 results)  Recent Labs  08/25/12 1652  PROBNP 930.8*   Recent Results (from the past 240 hour(s))  CULTURE, BLOOD (ROUTINE X 2)     Status: None   Collection Time    05/04/13  4:03 PM      Result Value Range Status   Specimen Description BLOOD LEFT ANTECUBITAL   Final   Special Requests BOTTLES DRAWN AEROBIC AND ANAEROBIC 5CC   Final   Culture  Setup Time     Final   Value: 05/04/2013 22:32     Performed at [facility]   Culture     Final   Value:        BLOOD CULTURE RECEIVED NO GROWTH TO DATE CULTURE WILL BE HELD FOR 5 DAYS BEFORE ISSUING A FINAL NEGATIVE REPORT     Performed at [facility]   Report Status PENDING   Incomplete  CULTURE, BLOOD (ROUTINE X 2)     Status: None   Collection Time    05/04/13  4:37 PM      Result Value Range Status   Specimen Description BLOOD RIGHT WRIST   Final   Special Requests BOTTLES DRAWN AEROBIC AND ANAEROBIC 3CC   Final   Culture  Setup Time     Final   Value: 05/04/2013 22:33     Performed at [facility]   Culture     Final   Value:        BLOOD CULTURE RECEIVED NO GROWTH TO DATE CULTURE WILL BE HELD  FOR 5 DAYS BEFORE ISSUING A FINAL NEGATIVE REPORT     Performed at [facility]   Report Status PENDING   Incomplete  CULTURE, EXPECTORATED SPUTUM-ASSESSMENT     Status: None   Collection Time    05/04/13 10:38 PM      Result Value Range Status   Specimen Description SPUTUM   Final   Special Requests NONE   Final   Sputum evaluation     Final   Value: THIS SPECIMEN IS ACCEPTABLE. RESPIRATORY CULTURE REPORT TO FOLLOW.   Report Status 05/04/2013 FINAL   Final  CULTURE, RESPIRATORY (NON-EXPECTORATED)     Status: None   Collection Time    05/04/13 10:38 PM      Result Value Range Status   Specimen Description SPUTUM   Final   Special Requests NONE   Final   Gram Stain     Final   Value: ABUNDANT WBC PRESENT,BOTH PMN AND MONONUCLEAR     NO SQUAMOUS EPITHELIAL CELLS SEEN     FEW GRAM NEGATIVE RODS     Performed at [facility]   Culture PENDING   Incomplete   Report Status PENDING   Incomplete   Studies: Dg Chest 2 View  05/04/2013   CLINICAL DATA:  Fever.  Shortness of breath.  EXAM: CHEST  2 VIEW  COMPARISON:  10/10/2012.  FINDINGS: Tortuous thoracic aorta. The cardiopericardial silhouette is enlarged. Cardiac stent is noted. Dual lead left subclavian cardiac pacemaker. There is mild bilateral basilar airspace opacity which may represent interstitial pulmonary edema. Pneumonia is considered less likely. Small bilateral pleural effusions are present. Severe bilateral glenohumeral osteoarthritis. Aortic arch atherosclerosis with tortuosity of the aorta.  IMPRESSION: Cardiomegaly and interstitial opacity at the bases suggest mild CHF with interstitial pulmonary edema. Pneumonia considered less likely.   Electronically Signed   By: Andreas Newport M.D.   On: 05/04/2013 17:15   Scheduled Meds: . ipratropium  0.5 mg Nebulization TID   And  . albuterol  2.5 mg  Nebulization TID  . atorvastatin  10 mg Oral QPM  . aztreonam  1 g Intravenous Q8H  . calcium carbonate  1 tablet Oral BID  . cholecalciferol  1,000 Units Oral BID  . darifenacin  15 mg Oral Daily  . dextromethorphan-guaiFENesin  1 tablet Oral BID  . HYDROcodone-acetaminophen  1-2 tablet Oral TID  . mirtazapine  7.5 mg Oral QHS  . mometasone-formoterol  2 puff Inhalation BID  . multivitamin with minerals  1 tablet Oral Daily  . nitroGLYCERIN  0.4 mg Transdermal QPM  . omega-3 acid ethyl esters  1 g Oral BID  . pantoprazole  40 mg Oral BID  . polyethylene glycol  17 g Oral QODAY  . potassium chloride  10 mEq Oral Daily  . vancomycin  750 mg Intravenous Q24H  . vitamin B-12  1,000 mcg Oral Daily  . Warfarin - Pharmacist Dosing Inpatient   Does not apply q1800   Continuous Infusions: . sodium chloride 75 mL/hr at 05/04/13 2052   Active Problems:   HYPERTENSION   CAD   Paroxysmal atrial fibrillation   Sinoatrial node dysfunction   Cardiac pacemaker in situ   Long-term (current) use of anticoagulants   Rib fracture   Diastolic CHF   HLD (hyperlipidemia)   History of TIAs   Pneumonia  Time spent: 25  Pamella Pert, MD [facility] Pager [phone]. If 7 PM - 7 AM, please contact night-coverage at [email or web address], password [email or web address] 05/05/2013, 1:15 PM  LOS: 1 day

## 2013-05-06 DIAGNOSIS — R791 Abnormal coagulation profile: Secondary | ICD-10-CM | POA: Diagnosis present

## 2013-05-06 DIAGNOSIS — D638 Anemia in other chronic diseases classified elsewhere: Secondary | ICD-10-CM

## 2013-05-06 DIAGNOSIS — I5032 Chronic diastolic (congestive) heart failure: Secondary | ICD-10-CM | POA: Diagnosis present

## 2013-05-06 DIAGNOSIS — J189 Pneumonia, unspecified organism: Secondary | ICD-10-CM | POA: Diagnosis present

## 2013-05-06 DIAGNOSIS — E876 Hypokalemia: Secondary | ICD-10-CM | POA: Diagnosis present

## 2013-05-06 LAB — BASIC METABOLIC PANEL
CO2: 26 mEq/L (ref 19–32)
Calcium: 9 mg/dL (ref 8.4–10.5)
Chloride: 103 mEq/L (ref 96–112)
Potassium: 3.3 mEq/L — ABNORMAL LOW (ref 3.5–5.1)
Sodium: 137 mEq/L (ref 135–145)

## 2013-05-06 LAB — PROTIME-INR: Prothrombin Time: 35.2 seconds — ABNORMAL HIGH (ref 11.6–15.2)

## 2013-05-06 LAB — CBC
HCT: 30.7 % — ABNORMAL LOW (ref 36.0–46.0)
Hemoglobin: 9.9 g/dL — ABNORMAL LOW (ref 12.0–15.0)
MCH: 30.7 pg (ref 26.0–34.0)
Platelets: 158 10*3/uL (ref 150–400)
RBC: 3.23 MIL/uL — ABNORMAL LOW (ref 3.87–5.11)
WBC: 6.2 10*3/uL (ref 4.0–10.5)

## 2013-05-06 MED ORDER — POTASSIUM CHLORIDE CRYS ER 20 MEQ PO TBCR
40.0000 meq | EXTENDED_RELEASE_TABLET | Freq: Once | ORAL | Status: AC
  Administered 2013-05-06: 40 meq via ORAL
  Filled 2013-05-06: qty 2

## 2013-05-06 NOTE — Progress Notes (Signed)
CSW continuing to follow for pt disposition planning.  Pt from [facility] ALF. Awaiting PT/OT evaluations to determine if pt able to return to ALF level of care or if pt will need to go to Skilled level of care for rehab prior to return to ALF level of care.  CSW spoke with [facility] and updated facility. Per [facility], pt can return to [facility] and facility will have ALF bed or SNF bed depending on pt needs.  CSW discussed this with pt at bedside. Pt appreciative of CSW visit and aware that CSW will be following up on Friday to continue to assist with pt disposition planning.   CSW to continue to follow to assist with pt discharge plans to [facility] ALF or SNF depending on pt progress and PT evaluations.  Jacklynn Lewis, MSW, LCSW Clinical Social Work [phone]

## 2013-05-06 NOTE — Progress Notes (Addendum)
[facility] PROGRESS NOTE  [name] ZOX:[id number] DOB: [birth year] DOA: 05/04/2013 PCP: Londell Moh, MD  Brief narrative: 79 year old female with past medical history of atrial fibrillation, on coumadin, pacemaker, CAD with multiple cardiac stents, history of TIA's, dyslipidemia, from [facility] who presented to [facility] ED 05/04/2013 with.increasing productive cough,congestion and fever of 102 F. In ED, pt was hypoxic. CXR revealed cardiomegaly and interstitial opacity at bases, mild CHF, less likely pneumonia. Because of the fever and hypoxia pt was started on broad spectrum antibiotics for possible HCAP.  Assessment/Plan:  Principal Problem:   HCAP (healthcare-associated pneumonia) - pt started on broad spectrum coverage, aztreonam and vancomycin, considering fever and hypoxia, treatment for HCAP - blood cultures to date show no growth - sputum culture was reincubated for better growth - continue oxygen support via nasal canula to keep O2 saturation above 90% - continue duoneb every 8 hours scheduled; continue dulera BID; albuterol as needed for shortness of breath and/or wheezing every 6 hours Active Problems:   HYPERTENSION - normotensive at this time, not on BP meds   CAD and stents placement, cardiac pacemaker - troponins WNL since the admission - nitroglycerin PRN for chest pain - continue statin therapy   Paroxysmal atrial fibrillation - on coumadin - INR supratherapeutic and 3.69 - hold coumadin - coumadin per pharmacy dosing   HLD (hyperlipidemia) - continue statin therapy and omega 3 fatty acid   Hypokalemia - on low daily dose potassium supplementation but will give extra dose today 40 meq PO as potassium still low at 3.3 - would try to keep potassium at 4   Anemia of chronic disease - likely related to chronic anticoagulation - hemoglobin stable at 9.9   Acute on chronic diastolic CHF  - ProBNP  931 on this admission; last 2 D ECHO on  file in 2013 with EF of 60% - troponin negative x 3  - given Lasix iv x 1 dose 05/05/13   Moderate protein calorie malnutrition - continue ensure supplements - PO intake as tolerated    Consultants:  None  Procedures:  None  Antibiotics  Aztreonam   Vancomycin   Code Status: full code Family Communication: no family at the bedside Disposition Plan: needs PT evaluation  Manson Passey, MD  [facility] Pager [phone]  If 7PM-7AM, please contact night-coverage [email or web address] Password [email or web address] 05/06/2013, 2:04 PM   LOS: 2 days    HPI/Subjective: No acute overnight events.   Objective: Filed Vitals:   05/05/13 2100 05/06/13 0637 05/06/13 0743 05/06/13 1347  BP: 107/51 136/80  115/52  Pulse: 60 61  64  Temp: 97.8 F (36.6 C) 98.1 F (36.7 C)  98.1 F (36.7 C)  TempSrc: Oral Oral  Oral  Resp: 18 16  20   Height:      Weight:      SpO2: 98% 98% 97% 97%    Intake/Output Summary (Last 24 hours) at 05/06/13 1404 Last data filed at 05/06/13 1351  Gross per 24 hour  Intake   3270 ml  Output    100 ml  Net   3170 ml    Exam:   General:  Pt is not in acute distress  Cardiovascular: paced rhythm, (+) S1, S2  Respiratory: bibasilar crackles  Abdomen: Soft, non tender, non distended, bowel sounds present  Extremities: No edema, pulses DP and PT palpable bilaterally  Neuro: Grossly nonfocal  Data Reviewed: Basic Metabolic Panel:  Recent Labs Lab 05/04/13 1547 05/04/13 1949 05/05/13 0140  05/06/13 0430  NA 135  --  133* 137  K 3.8  --  3.8 3.3*  CL 98  --  98 103  CO2 24  --  24 26  GLUCOSE 138*  --  108* 104*  BUN 19  --  19 14  CREATININE 0.75 0.71 0.68 0.57  CALCIUM 9.2  --  8.7 9.0  MG  --  2.1  --   --    Liver Function Tests:  Recent Labs Lab 05/05/13 0140  AST 28  ALT 20  ALKPHOS 75  BILITOT 0.6  PROT 6.0  ALBUMIN 2.8*   No results found for this basename: LIPASE, AMYLASE,  in the last 168 hours No results found for this  basename: AMMONIA,  in the last 168 hours CBC:  Recent Labs Lab 05/04/13 1547 05/04/13 1949 05/05/13 0251 05/06/13 0430  WBC 9.0 8.8 8.6 6.2  NEUTROABS 7.5  --  6.0  --   HGB 11.3* 11.0* 9.9* 9.9*  HCT 34.4* 34.2* 29.9* 30.7*  MCV 93.2 94.7 94.3 95.0  PLT 163 166 158 158   Cardiac Enzymes:  Recent Labs Lab 05/04/13 1547 05/04/13 1949 05/05/13 0140  TROPONINI <0.30 <0.30 <0.30   BNP: No components found with this basename: POCBNP,  CBG: No results found for this basename: GLUCAP,  in the last 168 hours  CULTURE, BLOOD (ROUTINE X 2)     Status: None   Collection Time    05/04/13  4:03 PM      Result Value Range Status   Specimen Description BLOOD LEFT ANTECUBITAL   Final   Value:        BLOOD CULTURE RECEIVED NO GROWTH TO DATE T     Performed at [facility]   Report Status PENDING   Incomplete  CULTURE, BLOOD (ROUTINE X 2)     Status: None   Collection Time    05/04/13  4:37 PM      Result Value Range Status   Specimen Description BLOOD RIGHT WRIST   Final   Value:        BLOOD CULTURE RECEIVED NO GROWTH TO DATE      Performed at [facility]   Report Status PENDING   Incomplete  CULTURE, EXPECTORATED SPUTUM-ASSESSMENT     Status: None   Collection Time    05/04/13 10:38 PM      Result Value Range Status   Specimen Description SPUTUM   Final   Special Requests NONE   Final   Sputum evaluation     Final   Value: THIS SPECIMEN IS ACCEPTABLE. RESPIRATORY CULTURE REPORT TO FOLLOW.   Report Status 05/04/2013 FINAL   Final  CULTURE, RESPIRATORY (NON-EXPECTORATED)     Status: None   Collection Time    05/04/13 10:38 PM      Result Value Range Status   Specimen Description SPUTUM   Final   Value: Culture reincubated for better growth     Performed at [facility]   Report Status PENDING   Incomplete     Studies: Dg Chest 2 View 05/04/2013    IMPRESSION: Cardiomegaly and interstitial opacity at the bases suggest mild CHF with  interstitial pulmonary edema. Pneumonia considered less likely.      Scheduled Meds: . ipratropium  0.5 mg Nebulization TID   And  . albuterol  2.5 mg Nebulization TID  . atorvastatin  10 mg Oral QPM  . aztreonam  1 g Intravenous Q8H  . calcium  carbonate  1 tablet Oral BID  . cholecalciferol  1,000 Units Oral BID  . darifenacin  15 mg Oral Daily  . dextromethorphan-guai  1 tablet Oral BID  . feeding supplement (ENSURE COMPLETE)  237 mL Oral BID BM  . HYDROcodone-acetamin  1-2 tablet Oral TID  . mirtazapine  7.5 mg Oral QHS  . mometasone-formoterol  2 puff Inhalation BID  . multivitamin   1 tablet Oral Daily  . nitroGLYCERIN  0.4 mg Transdermal QPM  . omega-3 acid ethyl  1 g Oral BID  . pantoprazole  40 mg Oral BID  . polyethylene glycol  17 g Oral QODAY  . potassium chloride  10 mEq Oral Daily  . vancomycin  750 mg Intravenous Q24H  . vitamin B-12  1,000 mcg Oral Daily  . Warfarin    Does not apply q1800   Continuous Infusions: . sodium chloride 75 mL/hr at 05/06/13 [phone]

## 2013-05-06 NOTE — Progress Notes (Signed)
ANTICOAGULATION CONSULT NOTE - Follow-up  Pharmacy Consult for Warfarin  Indication: atrial fibrillation  Allergies  Allergen Reactions  . Ampicillin Other (See Comments)    Reaction Unknown   . Ciprofloxin Hcl [Ciprofloxacin Hcl] Other (See Comments)    Reaction Unknown   . Demerol Other (See Comments)    Reaction Unknown   . Sulfa Antibiotics Rash    Patient Measurements: Height: 5\' 2"  (157.5 cm) (Entered from previous admission ) Weight: 120 lb (54.432 kg) (entered from previous admission ) IBW/kg (Calculated) : 50.1   Vital Signs: Temp: 98.1 F (36.7 C) (12/24 0637) Temp src: Oral (12/24 0637) BP: 136/80 mmHg (12/24 0637) Pulse Rate: 61 (12/24 0637)  Labs:  Recent Labs  05/04/13 1547 05/04/13 1623 05/04/13 1949 05/05/13 0140 05/05/13 0251 05/06/13 0430  HGB 11.3*  --  11.0*  --  9.9* 9.9*  HCT 34.4*  --  34.2*  --  29.9* 30.7*  PLT 163  --  166  --  158 158  LABPROT  --  26.0*  --  30.7*  --  35.2*  INR  --  2.48*  --  3.08*  --  3.69*  CREATININE 0.75  --  0.71 0.68  --  0.57  TROPONINI <0.30  --  <0.30 <0.30  --   --     Estimated Creatinine Clearance: 33.3 ml/min (by C-G formula based on Cr of 0.57).   Assessment: 79 yo F admitted 12/22 with HCAP, on chronic anticoagulation with warfarin for A.fib.   Home warfarin dose reported as 5 mg on Mondays and Fridays and 2.5 mg on all other days; Last dose reported as 12/21  INR supratherapeutic and increasing after 2.5mg  on 12/22 and no dose 12/23  H/H stable, no bleeding noted  Plts low/stable, baseline unknown   Patient appears to be eating some now since yesterday PM  Goal of Therapy:  INR 2-3 Monitor platelets by anticoagulation protocol: Yes   Plan:  1) No warfarin again tonight 2) Daily INR   Hessie Knows, PharmD, BCPS Pager [phone] 05/06/2013 8:35 AM

## 2013-05-06 NOTE — Evaluation (Signed)
Physical Therapy Evaluation Patient Details Name: [name] MRN: [id number] DOB: October 19, 1915 Today's Date: 05/06/2013 Time: 4098-1191 PT Time Calculation (min): 20 min  PT Assessment / Plan / Recommendation History of Present Illness  79 yo female admitted with fever, SOB. Hx of A fib, TIA, osteopenia.   Clinical Impression  On eval, pt required Min assist for mobility-ambulated ~90 feet with walker. Pt is deconditioned. O2 sats 93% on RA during ambulation, dyspnea 2/4. Recommend a short stay in rehab at [facility] before returning to ALF.     PT Assessment  Patient needs continued PT services    Follow Up Recommendations  SNF (short stay in rehab before returning to ALF)    Does the patient have the potential to tolerate intense rehabilitation      Barriers to Discharge        Equipment Recommendations  None recommended by PT    Recommendations for Other Services OT consult   Frequency Min 3X/week    Precautions / Restrictions Precautions Precautions: Fall Restrictions Weight Bearing Restrictions: No   Pertinent Vitals/Pain 95% RA, 69 bpm at rest; 93% RA, 78 bpm during ambulation.  Replaced  O2 at end of session      Mobility  Bed Mobility Bed Mobility: Supine to Sit;Sit to Supine Supine to Sit: 4: Min assist Sit to Supine: 4: Min assist Details for Bed Mobility Assistance: Assist for trunk and bil LEs. Increased time.  Transfers Transfers: Sit to Stand;Stand to [employer] Transfers Sit to Stand: 4: Min assist;From bed;From chair/3-in-1 Stand to Sit: 4: Min assist;To bed;To chair/3-in-1 Stand Pivot Transfers: 4: Min assist Details for Transfer Assistance: x 2. Assist to rise, stabilize, control descent. Stand pivot to [facility].  Ambulation/Gait Ambulation/Gait Assistance: 4: Min assist Ambulation Distance (Feet): 90 Feet Assistive device: Rolling walker Ambulation/Gait Assistance Details: Assist to stabilize pt throughout ambulation. slow gait  speed. Dyspnea 2/4. O2 sats 92% on RA Gait Pattern: Step-through pattern;Trunk flexed    Exercises     PT Diagnosis: Difficulty walking;Generalized weakness  PT Problem List: Decreased strength;Decreased activity tolerance;Decreased balance;Decreased mobility;Decreased knowledge of use of DME PT Treatment Interventions: DME instruction;Gait training;Functional mobility training;Therapeutic activities;Therapeutic exercise;Balance training;Patient/family education     PT Goals(Current goals can be found in the care plan section) Acute Rehab PT Goals Patient Stated Goal: return to [facility] PT Goal Formulation: With patient Time For Goal Achievement: 05/20/13 Potential to Achieve Goals: Good  Visit Information  Last PT Received On: 05/06/13 Assistance Needed: +1 History of Present Illness: 79 yo female admitted with fever, SOB. Hx of A fib, TIA, osteopenia.        Prior Functioning  Home Living Family/patient expects to be discharged to:: Assisted living Home Equipment: Walker - 4 wheels;Bedside commode Additional Comments: lift chair Prior Function Level of Independence: Independent with assistive device(s) Comments: ambulates to dining room with rollator Communication Communication: [facility]    Cognition  Cognition Arousal/Alertness: Awake/alert Behavior During Therapy: WFL for tasks assessed/performed Overall Cognitive Status: Within Functional Limits for tasks assessed    Extremity/Trunk Assessment Upper Extremity Assessment Upper Extremity Assessment: Generalized weakness Lower Extremity Assessment Lower Extremity Assessment: Generalized weakness Cervical / Trunk Assessment Cervical / Trunk Assessment: Kyphotic   Balance    End of Session PT - End of Session Activity Tolerance: Patient limited by fatigue Patient left: in bed;with call bell/phone within reach;with bed alarm set  GP     Rebeca Alert, MPT Pager: [phone]

## 2013-05-07 LAB — CBC
HCT: 29.8 % — ABNORMAL LOW (ref 36.0–46.0)
Hemoglobin: 9.6 g/dL — ABNORMAL LOW (ref 12.0–15.0)
MCH: 30.4 pg (ref 26.0–34.0)
RBC: 3.16 MIL/uL — ABNORMAL LOW (ref 3.87–5.11)
WBC: 6.3 10*3/uL (ref 4.0–10.5)

## 2013-05-07 LAB — BASIC METABOLIC PANEL
BUN: 13 mg/dL (ref 6–23)
CO2: 24 mEq/L (ref 19–32)
Calcium: 9.3 mg/dL (ref 8.4–10.5)
Glucose, Bld: 100 mg/dL — ABNORMAL HIGH (ref 70–99)
Potassium: 4.1 mEq/L (ref 3.5–5.1)
Sodium: 137 mEq/L (ref 135–145)

## 2013-05-07 LAB — PROTIME-INR
INR: 2.67 — ABNORMAL HIGH (ref 0.00–1.49)
Prothrombin Time: 27.5 seconds — ABNORMAL HIGH (ref 11.6–15.2)

## 2013-05-07 MED ORDER — WARFARIN SODIUM 1 MG PO TABS
1.0000 mg | ORAL_TABLET | Freq: Once | ORAL | Status: AC
  Administered 2013-05-07: 1 mg via ORAL
  Filled 2013-05-07: qty 1

## 2013-05-07 MED ORDER — DEXTROSE 5 % IV SOLN
1.0000 g | Freq: Three times a day (TID) | INTRAVENOUS | Status: DC
  Administered 2013-05-07 – 2013-05-10 (×8): 1 g via INTRAVENOUS
  Filled 2013-05-07 (×9): qty 1

## 2013-05-07 MED ORDER — LEVOFLOXACIN IN D5W 750 MG/150ML IV SOLN
750.0000 mg | INTRAVENOUS | Status: DC
  Administered 2013-05-07 – 2013-05-09 (×2): 750 mg via INTRAVENOUS
  Filled 2013-05-07 (×2): qty 150

## 2013-05-07 NOTE — Progress Notes (Signed)
ANTICOAGULATION CONSULT NOTE - Follow-up  Pharmacy Consult for Warfarin  Indication: atrial fibrillation  Allergies  Allergen Reactions  . Ampicillin Other (See Comments)    Reaction Unknown   . Ciprofloxin Hcl [Ciprofloxacin Hcl] Other (See Comments)    Reaction Unknown   . Demerol Other (See Comments)    Reaction Unknown   . Sulfa Antibiotics Rash    Patient Measurements: Height: 5\' 2"  (157.5 cm) (Entered from previous admission ) Weight: 120 lb (54.432 kg) (entered from previous admission ) IBW/kg (Calculated) : 50.1   Vital Signs: Temp: 98.3 F (36.8 C) (12/25 0544) Temp src: Oral (12/25 0544) BP: 133/63 mmHg (12/25 0544) Pulse Rate: 62 (12/25 0544)  Labs:  Recent Labs  05/04/13 1547  05/04/13 1949 05/05/13 0140 05/05/13 0251 05/06/13 0430 05/07/13 0355  HGB 11.3*  --  11.0*  --  9.9* 9.9* 9.6*  HCT 34.4*  --  34.2*  --  29.9* 30.7* 29.8*  PLT 163  --  166  --  158 158 166  LABPROT  --   < >  --  30.7*  --  35.2* 27.5*  INR  --   < >  --  3.08*  --  3.69* 2.67*  CREATININE 0.75  --  0.71 0.68  --  0.57 0.54  TROPONINI <0.30  --  <0.30 <0.30  --   --   --   < > = values in this interval not displayed.  Estimated Creatinine Clearance: 33.3 ml/min (by C-G formula based on Cr of 0.54).   Assessment: 79 yo F admitted 12/22 with HCAP, on chronic anticoagulation with warfarin for A.fib.   Home warfarin dose reported as 5 mg on Mondays and Fridays and 2.5 mg on all other days; Last dose reported as 12/21  INR down into therapeutic range today after holding dose x 2 days.  H/H stable, no bleeding noted  Plts low/stable, baseline unknown   Tolerating diet.  Broad-spectrum abx will increase sensitivity to warfarin.  Goal of Therapy:  INR 2-3 Monitor platelets by anticoagulation protocol: Yes   Plan:   Give warfarin 1mg  today keep INR from dropping too quickly.  Daily INR.  Charolotte Eke, PharmD, pager [phone]. 05/07/2013,8:38 AM.

## 2013-05-07 NOTE — Progress Notes (Signed)
ANTIBIOTIC CONSULT NOTE - FOLLOW UP  Pharmacy Consult for levofloxacin/aztreonam Indication: HCAP  Allergies  Allergen Reactions  . Ampicillin Other (See Comments)    Reaction Unknown   . Ciprofloxin Hcl [Ciprofloxacin Hcl] Other (See Comments)    Reaction Unknown   . Demerol Other (See Comments)    Reaction Unknown   . Sulfa Antibiotics Rash    Patient Measurements: Height: 5\' 2"  (157.5 cm) (Entered from previous admission ) Weight: 120 lb (54.432 kg) (entered from previous admission ) IBW/kg (Calculated) : 50.1 Adjusted Body Weight:  Vital Signs: Temp: 98.3 F (36.8 C) (12/25 0544) Temp src: Oral (12/25 0544) BP: 133/63 mmHg (12/25 0544) Pulse Rate: 62 (12/25 0544) Intake/Output from previous day: 12/24 0701 - 12/25 0700 In: 2537.8 [P.O.:1360; I.V.:1027.8; IV Piggyback:150] Out: 800 [Urine:800] Intake/Output from this shift: Total I/O In: -  Out: 350 [Urine:350]  Labs:  Recent Labs  05/05/13 0140 05/05/13 0251 05/06/13 0430 05/07/13 0355  WBC  --  8.6 6.2 6.3  HGB  --  9.9* 9.9* 9.6*  PLT  --  158 158 166  CREATININE 0.68  --  0.57 0.54   Estimated Creatinine Clearance: 33.3 ml/min (by C-G formula based on Cr of 0.54). No results found for this basename: VANCOTROUGH, VANCOPEAK, VANCORANDOM, GENTTROUGH, GENTPEAK, GENTRANDOM, TOBRATROUGH, TOBRAPEAK, TOBRARND, AMIKACINPEAK, AMIKACINTROU, AMIKACIN,  in the last 72 hours   Microbiology: Recent Results (from the past 720 hour(s))  CULTURE, BLOOD (ROUTINE X 2)     Status: None   Collection Time    05/04/13  4:03 PM      Result Value Range Status   Specimen Description BLOOD LEFT ANTECUBITAL   Final   Special Requests BOTTLES DRAWN AEROBIC AND ANAEROBIC 5CC   Final   Culture  Setup Time     Final   Value: 05/04/2013 22:32     Performed at [facility]   Culture     Final   Value:        BLOOD CULTURE RECEIVED NO GROWTH TO DATE CULTURE WILL BE HELD FOR 5 DAYS BEFORE ISSUING A FINAL NEGATIVE REPORT    Performed at [facility]   Report Status PENDING   Incomplete  CULTURE, BLOOD (ROUTINE X 2)     Status: None   Collection Time    05/04/13  4:37 PM      Result Value Range Status   Specimen Description BLOOD RIGHT WRIST   Final   Special Requests BOTTLES DRAWN AEROBIC AND ANAEROBIC 3CC   Final   Culture  Setup Time     Final   Value: 05/04/2013 22:33     Performed at [facility]   Culture     Final   Value:        BLOOD CULTURE RECEIVED NO GROWTH TO DATE CULTURE WILL BE HELD FOR 5 DAYS BEFORE ISSUING A FINAL NEGATIVE REPORT     Performed at [facility]   Report Status PENDING   Incomplete  CULTURE, EXPECTORATED SPUTUM-ASSESSMENT     Status: None   Collection Time    05/04/13 10:38 PM      Result Value Range Status   Specimen Description SPUTUM   Final   Special Requests NONE   Final   Sputum evaluation     Final   Value: THIS SPECIMEN IS ACCEPTABLE. RESPIRATORY CULTURE REPORT TO FOLLOW.   Report Status 05/04/2013 FINAL   Final  CULTURE, RESPIRATORY (NON-EXPECTORATED)     Status: None  Collection Time    05/04/13 10:38 PM      Result Value Range Status   Specimen Description SPUTUM   Final   Special Requests NONE   Final   Gram Stain     Final   Value: ABUNDANT WBC PRESENT,BOTH PMN AND MONONUCLEAR     NO SQUAMOUS EPITHELIAL CELLS SEEN     FEW GRAM NEGATIVE RODS     Performed at [facility]   Culture     Final   Value: ABUNDANT PSEUDOMONAS AERUGINOSA     Performed at [facility]   Report Status PENDING   Incomplete    Anti-infectives   Start     Dose/Rate Route Frequency Ordered Stop   05/05/13 1000  aztreonam (AZACTAM) 1 g in dextrose 5 % 50 mL IVPB  Status:  Discontinued     1 g 100 mL/hr over 30 Minutes Intravenous Every 8 hours 05/05/13 0852 05/07/13 1010   05/04/13 2200  aztreonam (AZACTAM) 2 g in dextrose 5 % 50 mL IVPB  Status:  Discontinued     2 g 100 mL/hr over 30 Minutes Intravenous 3 times per day 05/04/13 1913  05/04/13 1957   05/04/13 1830  vancomycin (VANCOCIN) IVPB 750 mg/150 ml premix  Status:  Discontinued     750 mg 150 mL/hr over 60 Minutes Intravenous Every 24 hours 05/04/13 1756 05/07/13 1010   05/04/13 1800  aztreonam (AZACTAM) 2 g in dextrose 5 % 50 mL IVPB  Status:  Discontinued     2 g 100 mL/hr over 30 Minutes Intravenous Every 8 hours 05/04/13 1726 05/05/13 4098      Assessment: 79 yo presents from assisted living facility with tachypnea and hypoxia, started vancomycin and aztreonam  to treat HCAP  12/23(12/22 dose not given) >> Vancomycin >> 12/25 12/22 >> Azactam >>  12/25 >> levofloxacin  Temp: afebrile since 12/23 AM WBC: WNL Renal: Scr stable, CrCl 33 ml/min (CG), 45N (using SCr=0.8)  Micro 12/22 Blood x 2: ngtd 12/22 sputum: Abundant pseudomonas 12/22 flu panel: negative 12/22 Spneumo Ur Ag: negative 12/22 Legionella Ur Ag: negative  Goal of Therapy:  Appropriately dose antibiotics for age and renal function  Plan:   Pseudomonas growing in sputum culture, susceptibilities pending.  Spoke with prescriber, will continue aztreonam and continue with orders to start levofloxacin pending final suscepbitilities  Continue aztreonam 1gm IV q8h  Start levofloxacin 750mg  IV q48h based on est CrCl  H/o allergy to cipro but rxn unknown - OK to start levofloxacin  Await final susceptibility results and ability to narrow  [name], PharmD, BCPS.   Pager: [phone]  05/07/2013,10:14 AM

## 2013-05-07 NOTE — Progress Notes (Addendum)
[facility] PROGRESS NOTE  PRESLI FANGUY AVW:[id number] DOB: August 21, 1915 DOA: 05/04/2013 PCP: Londell Moh, MD  Brief narrative: 79 year old female with past medical history of atrial fibrillation, on coumadin, pacemaker, CAD with multiple cardiac stents, history of TIA's, dyslipidemia, from [facility] who presented to [facility] ED 05/04/2013 with.increasing productive cough,congestion and fever of 102 F. In ED, pt was hypoxic. CXR revealed cardiomegaly and interstitial opacity at bases, mild CHF, less likely pneumonia. Because of the fever and hypoxia pt was started on broad spectrum antibiotics for possible HCAP.   Assessment/Plan:   Principal Problem:  HCAP (healthcare-associated pneumonia)  - pt started on broad spectrum coverage, aztreonam and vancomycin, considering fever and hypoxia, treatment for HCAP. Today is day #4 of broad spectrum coverage sputum culutre growing pseudomonas so will continue aztreonam and add Levaquin but will d/c vanco - blood cultures to date show no growth  - continue oxygen support via nasal canula to keep O2 saturation above 90%  - continue duoneb every 8 hours scheduled; continue dulera BID; albuterol as needed for shortness of breath and/or wheezing every 6 hours  Active Problems:  HYPERTENSION  - BP 133/63 this am CAD and stents placement, cardiac pacemaker  - troponins WNL since the admission  - nitroglycerin PRN for chest pain  - continue statin therapy  Paroxysmal atrial fibrillation  - on coumadin  - INR therapeutic this am - coumadin per pharmacy dosing  HLD (hyperlipidemia)  - continue statin therapy and omega 3 fatty acid  Hypokalemia  - on low daily dose potassium supplementation  - would try to keep potassium at 4  - potassium 4.1 this am Anemia of chronic disease  - likely related to chronic anticoagulation  - hemoglobin stable at 9.6 Acute on chronic diastolic CHF  - ProBNP 931 on this admission; last 2 D ECHO  on file in 2013 with EF of 60%  - troponin negative x 3  - given Lasix iv x 1 dose 05/05/13  Moderate protein calorie malnutrition  - continue ensure supplements  - PO intake as tolerated   Code Status: full code  Family Communication: no family at the bedside  Disposition Plan: follow up PT evaluation  Consultants:  None  Procedures:  None  Antibiotics  Aztreonam 05/04/2013 --> Vanco 05/04/2013 --> 05/07/2013 Levaquin 05/07/2013 -->   Manson Passey, MD  [facility] Pager [phone]  If 7PM-7AM, please contact night-coverage [email or web address] Password [facility] 05/07/2013, 7:34 AM   LOS: 3 days    HPI/Subjective: No acute overnight events.   Objective: Filed Vitals:   05/06/13 0743 05/06/13 1347 05/06/13 2147 05/07/13 0544  BP:  115/52 132/52 133/63  Pulse:  64 62 62  Temp:  98.1 F (36.7 C) 99.1 F (37.3 C) 98.3 F (36.8 C)  TempSrc:  Oral Axillary Oral  Resp:  20 18 16   Height:      Weight:      SpO2: 97% 97% 97% 97%    Intake/Output Summary (Last 24 hours) at 05/07/13 0734 Last data filed at 05/07/13 0545  Gross per 24 hour  Intake 2537.83 ml  Output    800 ml  Net 1737.83 ml   Exam:  General: Pt is not in acute distress  Cardiovascular: paced rhythm, (+) S1, S2  Respiratory: bibasilar crackles  Abdomen: Soft, non tender, non distended, bowel sounds present  Extremities: No edema, pulses DP and PT palpable bilaterally  Neuro: Grossly nonfocal   Data Reviewed: Basic Metabolic Panel:  Recent  Labs Lab 05/04/13 1547 05/04/13 1949 05/05/13 0140 05/06/13 0430 05/07/13 0355  NA 135  --  133* 137 137  K 3.8  --  3.8 3.3* 4.1  CL 98  --  98 103 105  CO2 24  --  24 26 24   GLUCOSE 138*  --  108* 104* 100*  BUN 19  --  19 14 13   CREATININE 0.75 0.71 0.68 0.57 0.54  CALCIUM 9.2  --  8.7 9.0 9.3  MG  --  2.1  --   --   --    Liver Function Tests:  Recent Labs Lab 05/05/13 0140  AST 28  ALT 20  ALKPHOS 75  BILITOT 0.6  PROT 6.0  ALBUMIN  2.8*   No results found for this basename: LIPASE, AMYLASE,  in the last 168 hours No results found for this basename: AMMONIA,  in the last 168 hours CBC:  Recent Labs Lab 05/04/13 1547 05/04/13 1949 05/05/13 0251 05/06/13 0430 05/07/13 0355  WBC 9.0 8.8 8.6 6.2 6.3  NEUTROABS 7.5  --  6.0  --   --   HGB 11.3* 11.0* 9.9* 9.9* 9.6*  HCT 34.4* 34.2* 29.9* 30.7* 29.8*  MCV 93.2 94.7 94.3 95.0 94.3  PLT 163 166 158 158 166   Cardiac Enzymes:  Recent Labs Lab 05/04/13 1547 05/04/13 1949 05/05/13 0140  TROPONINI <0.30 <0.30 <0.30   BNP: No components found with this basename: POCBNP,  CBG: No results found for this basename: GLUCAP,  in the last 168 hours  Recent Results (from the past 240 hour(s))  CULTURE, BLOOD (ROUTINE X 2)     Status: None   Collection Time    05/04/13  4:03 PM      Result Value Range Status   Specimen Description BLOOD LEFT ANTECUBITAL   Final   Special Requests BOTTLES DRAWN AEROBIC AND ANAEROBIC 5CC   Final   Culture  Setup Time     Final   Value: 05/04/2013 22:32     Performed at [facility]   Culture     Final   Value:        BLOOD CULTURE RECEIVED NO GROWTH TO DATE CULTURE WILL BE HELD FOR 5 DAYS BEFORE ISSUING A FINAL NEGATIVE REPORT     Performed at [facility]   Report Status PENDING   Incomplete  CULTURE, BLOOD (ROUTINE X 2)     Status: None   Collection Time    05/04/13  4:37 PM      Result Value Range Status   Specimen Description BLOOD RIGHT WRIST   Final   Special Requests BOTTLES DRAWN AEROBIC AND ANAEROBIC 3CC   Final   Culture  Setup Time     Final   Value: 05/04/2013 22:33     Performed at [facility]   Culture     Final   Value:        BLOOD CULTURE RECEIVED NO GROWTH TO DATE CULTURE WILL BE HELD FOR 5 DAYS BEFORE ISSUING A FINAL NEGATIVE REPORT     Performed at [facility]   Report Status PENDING   Incomplete  CULTURE, EXPECTORATED SPUTUM-ASSESSMENT     Status: None   Collection Time     05/04/13 10:38 PM      Result Value Range Status   Specimen Description SPUTUM   Final   Special Requests NONE   Final   Sputum evaluation     Final   Value: THIS  SPECIMEN IS ACCEPTABLE. RESPIRATORY CULTURE REPORT TO FOLLOW.   Report Status 05/04/2013 FINAL   Final  CULTURE, RESPIRATORY (NON-EXPECTORATED)     Status: None   Collection Time    05/04/13 10:38 PM      Result Value Range Status   Specimen Description SPUTUM   Final   Special Requests NONE   Final   Gram Stain     Final   Value: ABUNDANT WBC PRESENT,BOTH PMN AND MONONUCLEAR     NO SQUAMOUS EPITHELIAL CELLS SEEN     FEW GRAM NEGATIVE RODS     Performed at [facility]   Culture     Final   Value: Culture reincubated for better growth     Performed at [facility]   Report Status PENDING   Incomplete     Studies: No results found.  Scheduled Meds: . ipratropium  0.5 mg Nebulization TID   And  . albuterol  2.5 mg Nebulization TID  . atorvastatin  10 mg Oral QPM  . aztreonam  1 g Intravenous Q8H  . calcium carbonate  1 tablet Oral BID  . cholecalciferol  1,000 Units Oral BID  . darifenacin  15 mg Oral Daily  . dextromethorphan-guaiFENesin  1 tablet Oral BID  . feeding supplement (ENSURE COMPLETE)  237 mL Oral BID BM  . HYDROcodone-acetaminophen  1-2 tablet Oral TID  . mirtazapine  7.5 mg Oral QHS  . mometasone-formoterol  2 puff Inhalation BID  . multivitamin with minerals  1 tablet Oral Daily  . nitroGLYCERIN  0.4 mg Transdermal QPM  . omega-3 acid ethyl esters  1 g Oral BID  . pantoprazole  40 mg Oral BID  . polyethylene glycol  17 g Oral QODAY  . potassium chloride  10 mEq Oral Daily  . vancomycin  750 mg Intravenous Q24H  . vitamin B-12  1,000 mcg Oral Daily  . Warfarin - Pharmacist Dosing Inpatient   Does not apply q1800   Continuous Infusions: . sodium chloride 10 mL/hr at 05/06/13 1458

## 2013-05-08 DIAGNOSIS — I495 Sick sinus syndrome: Secondary | ICD-10-CM | POA: Insufficient documentation

## 2013-05-08 DIAGNOSIS — I959 Hypotension, unspecified: Secondary | ICD-10-CM | POA: Insufficient documentation

## 2013-05-08 LAB — CULTURE, RESPIRATORY W GRAM STAIN

## 2013-05-08 LAB — PROTIME-INR: INR: 2 — ABNORMAL HIGH (ref 0.00–1.49)

## 2013-05-08 MED ORDER — WARFARIN SODIUM 2.5 MG PO TABS
2.5000 mg | ORAL_TABLET | Freq: Once | ORAL | Status: AC
  Administered 2013-05-08: 2.5 mg via ORAL
  Filled 2013-05-08: qty 1

## 2013-05-08 NOTE — Progress Notes (Signed)
Per discussion with attending, patient anticipated for discharge on Monday 05/09/2013 if medically stable to [facility]. CSW left message with admissions at [facility].   .Catha Gosselin, [place] [phone]  ED CSW  .05/08/2013 1041am

## 2013-05-08 NOTE — Progress Notes (Signed)
[facility] PROGRESS NOTE  DANINE HOR BMW:[id number] DOB: 1915-11-16 DOA: 05/04/2013 PCP: Londell Moh, MD  Brief narrative: 79 year old female with past medical history of atrial fibrillation, on coumadin, pacemaker, CAD with multiple cardiac stents, history of TIA's, dyslipidemia, from [facility] who presented to [facility] ED 05/04/2013 with.increasing productive cough,congestion and fever of 102 F. In ED, pt was hypoxic. CXR revealed cardiomegaly and interstitial opacity at bases, mild CHF, less likely pneumonia. Because of the fever and hypoxia pt was started on broad spectrum antibiotics for possible HCAP. Hospital course is complicated with the development of bacteremia, gram-positive rods in one of the blood cultures.  Assessment/Plan:   Principal Problem:  HCAP (healthcare-associated pneumonia)  - pt started on broad spectrum coverage, aztreonam and vancomycin, considering fever and hypoxia, treatment for HCAP. We will continue aztreonam and Levaquin. Patient was initially also on vancomycin but this was discontinued 05/07/2013 - blood cultures- one blood culture is growing gram-positive rod - Sputum culture- growing Pseudomonas - continue oxygen support via nasal canula to keep O2 saturation above 90%  - continue duoneb every 8 hours scheduled; continue dulera BID; albuterol as needed for shortness of breath and/or wheezing every 6 hours  Active Problems:  Gram-positive bacteremia - Repeat blood cultures today - Continue aztreonam and Levaquin HYPERTENSION  - BP 125/64  CAD and stents placement, cardiac pacemaker  - troponins WNL since the admission  - nitroglycerin PRN for chest pain  - continue statin therapy  Paroxysmal atrial fibrillation  - on coumadin  - INR therapeutic - coumadin per pharmacy dosing  HLD (hyperlipidemia)  - continue statin therapy and omega 3 fatty acid  Hypokalemia  - on low daily dose potassium supplementation  - would try  to keep potassium at 4  - potassium 4.1   Anemia of chronic disease  - likely related to chronic anticoagulation  - hemoglobin stable at 9.6  Acute on chronic diastolic CHF  - ProBNP 931 on this admission; last 2 D ECHO on file in 2013 with EF of 60%  - troponin negative x 3  - given Lasix iv x 1 dose 05/05/13  Moderate protein calorie malnutrition  - continue ensure supplements  - PO intake as tolerated   Code Status: full code  Family Communication: no family at the bedside  Disposition Plan: to SNF once stable   Consultants:  None  Procedures:  None  Antibiotics  Aztreonam 05/04/2013 -->  Vanco 05/04/2013 --> 05/07/2013  Levaquin 05/07/2013 -->    Manson Passey, MD  [facility] Pager [phone]  If 7PM-7AM, please contact night-coverage [email or web address] Password [facility] 05/08/2013, 10:33 AM   LOS: 4 days    HPI/Subjective: No overnight events.   Objective: Filed Vitals:   05/07/13 2132 05/07/13 2136 05/08/13 0516 05/08/13 0837  BP: 136/78  125/64   Pulse: 55  60   Temp: 98 F (36.7 C)  98.4 F (36.9 C)   TempSrc: Axillary  Axillary   Resp: 18  16   Height:      Weight:      SpO2: 100% 100% 100% 95%    Intake/Output Summary (Last 24 hours) at 05/08/13 1033 Last data filed at 05/08/13 0519  Gross per 24 hour  Intake    290 ml  Output    350 ml  Net    -60 ml    Exam:   General:  Pt is not in acute distress  Cardiovascular: Regular rate and rhythm, S1/S2 appreciated  Respiratory: basilar crackles   Abdomen: Soft, non tender, non distended, bowel sounds present, no guarding  Extremities: No edema, pulses DP and PT palpable bilaterally  Neuro: Grossly nonfocal  Data Reviewed: Basic Metabolic Panel:  Recent Labs Lab 05/04/13 1547 05/04/13 1949 05/05/13 0140 05/06/13 0430 05/07/13 0355  NA 135  --  133* 137 137  K 3.8  --  3.8 3.3* 4.1  CL 98  --  98 103 105  CO2 24  --  24 26 24   GLUCOSE 138*  --  108* 104* 100*  BUN 19  --   19 14 13   CREATININE 0.75 0.71 0.68 0.57 0.54  CALCIUM 9.2  --  8.7 9.0 9.3  MG  --  2.1  --   --   --    Liver Function Tests:  Recent Labs Lab 05/05/13 0140  AST 28  ALT 20  ALKPHOS 75  BILITOT 0.6  PROT 6.0  ALBUMIN 2.8*   No results found for this basename: LIPASE, AMYLASE,  in the last 168 hours No results found for this basename: AMMONIA,  in the last 168 hours CBC:  Recent Labs Lab 05/04/13 1547 05/04/13 1949 05/05/13 0251 05/06/13 0430 05/07/13 0355  WBC 9.0 8.8 8.6 6.2 6.3  NEUTROABS 7.5  --  6.0  --   --   HGB 11.3* 11.0* 9.9* 9.9* 9.6*  HCT 34.4* 34.2* 29.9* 30.7* 29.8*  MCV 93.2 94.7 94.3 95.0 94.3  PLT 163 166 158 158 166   Cardiac Enzymes:  Recent Labs Lab 05/04/13 1547 05/04/13 1949 05/05/13 0140  TROPONINI <0.30 <0.30 <0.30   BNP: No components found with this basename: POCBNP,  CBG: No results found for this basename: GLUCAP,  in the last 168 hours  CULTURE, BLOOD (ROUTINE X 2)     Status: None   Collection Time    05/04/13  4:03 PM      Result Value Range Status   Specimen Description BLOOD LEFT ANTECUBITAL   Final   Value: GRAM POSITIVE RODS     Performed at [facility]   Report Status PENDING   Incomplete  CULTURE, BLOOD (ROUTINE X 2)     Status: None   Collection Time    05/04/13  4:37 PM      Result Value Range Status   Specimen Description BLOOD RIGHT WRIST   Final   Value:        BLOOD CULTURE RECEIVED NO GROWTH TO DATE     Performed at [facility]   Report Status PENDING   Incomplete  CULTURE, RESPIRATORY (NON-EXPECTORATED)     Status: None   Collection Time    05/04/13 10:38 PM      Result Value Range Status   Specimen Description SPUTUM   Final   Value: ABUNDANT PSEUDOMONAS AERUGINOSA     Performed at [facility]   Report Status 05/08/2013 FINAL   Final   Organism ID, Bacteria PSEUDOMONAS AERUGINOSA   Final     Studies: No results found.  Scheduled Meds: . ipratropium  0.5 mg  Nebulization TID   And  . albuterol  2.5 mg Nebulization TID  . atorvastatin  10 mg Oral QPM  . aztreonam  1 g Intravenous Q8H  . calcium carbonate  1 tablet Oral BID  . cholecalciferol  1,000 Units Oral BID  . darifenacin  15 mg Oral Daily  . HYDROcodone-acetaminophen  1-2 tablet Oral TID  . levofloxacin (LEVAQUIN)   750 mg  Intravenous Q48H  . mirtazapine  7.5 mg Oral QHS  . mometasone-formoterol  2 puff Inhalation BID  . multivitamin with minerals  1 tablet Oral Daily  . nitroGLYCERIN  0.4 mg Transdermal QPM  . omega-3 acid ethyl   1 g Oral BID  . pantoprazole  40 mg Oral BID  . polyethylene glycol  17 g Oral QODAY  . potassium chloride  10 mEq Oral Daily  . vitamin B-12  1,000 mcg Oral Daily  . warfarin  2.5 mg Oral ONCE-1800   Continuous Infusions: . sodium chloride 10 mL/hr at 05/06/13 1458

## 2013-05-08 NOTE — Progress Notes (Signed)
Leslie Moh, MD: Primary Cardiologist:  Dr Warrick Parisian is a 79 y.o. female with a h/o sick sinus syndrome sp PPM (SJM) by Dr Ladona Ridgel who presents today to establish care in the Electrophysiology device clinic.   The patient reports doing very well since having a pacemaker implanted.   Today, she  denies symptoms of palpitations, chest pain, shortness of breath, presyncope, syncope, or neurologic sequela.  The patientis tolerating medications without difficulties and is otherwise without complaint today.   Past Medical History  Diagnosis Date  . Transient ischemic attack (TIA) 1994  . Atrial fibrillation   . Hypertension   . Dyslipidemia   . Gastritis   . Vitamin B12 deficiency   . Peptic ulcer disease   . Osteopenia   . Macular degeneration, right eye   . Arthritis     back, shoulders  . Cough 05/21/2011    pt states cough x 2 days  . Edema     left leg  . Hypoglycemia   . Urinary incontinence   . CAD 06/15/2009  . Sinoatrial node dysfunction 06/15/2009    Dual-chamber PM inserted 03/18/09 St. Jude Zephyr XL DR dual-chamber, seriel [id number].  . Tachy-brady syndrome     Dual-chamber PM inserted 03/18/09 St. Jude Zephyr XL DR dual-chamber, seriel [id number].  Marland Kitchen CAD (coronary artery disease)     With LAD & RCA BMS, 2008 & LAD DES 2009 for restenosis. Both vessels patent by cath 03/18/09  . Atrial flutter   . Left heart failure   . Dyspnea     frequent  . Rectal bleeding     Aggravated by a combo of coumadin & aspirin. D/C'ed aspirin.  . Chronic diastolic congestive heart failure   . Asthma with COPD   . GERD (gastroesophageal reflux disease)   . Irritable bowel   . Hip fracture, right   . Anorexia 08/27/12    Nearly 20 lbs weight loss over past year. Anorexia weight loss or major problems now & likely represent a slow progression/worsening of the terminal illness, perhaps GI. Probably time to start considering a change in care plan from aggresive/diagnostic to  palliative (Dr. Verdis Prime 08/17/12)  . Coronary atherosclerosis of native coronary artery   . Chest discomfort 08/27/12    Nearly continuous.  . Poor appetite    Past Surgical History  Procedure Laterality Date  . Perforated ulcer    . Eye surgery      bilateral  . Pacemaker insertion  03/18/09    For symptomatic brady-tachy syndrome. St. Jude Zephyr XL DR dual-chamber, seriel [id number]  . Hip surgery      broke hip and has pin in it  . Drug-eluting stent  [id number],[id number]    LAD & RCA BMS in 2008 & LAD DES in 2009 for restenosis. Both vessels patent by cath 03/18/09.  . Cardiac catheterization  03/18/09    With LAD & RCA BMS in 2008 & LAD DES in 2009 for restenosis.  . Varicose vein surgery    . Orif hip fracture Right     Pin in hip  . Cataract extraction Bilateral     History   Social History  . Marital Status: Widowed    Spouse Name: N/A    Number of Children: N/A  . Years of Education: N/A   Occupational History  . Not on file.   Social History Main Topics  . Smoking status: Former Smoker    Quit date: 03/14/1996  .  Smokeless tobacco: Never Used  . Alcohol Use: No  . Drug Use: No  . Sexual Activity: No   Other Topics Concern  . Not on file   Social History Narrative   Widow, lives in the assisted living division of [facility]    Family History  Problem Relation Age of Onset  . CVA Mother   . Brain cancer Sister   . Heart attack Father   . Lymphoma Son   . CAD Son   . Testicular cancer Brother   . Alzheimer's disease Brother     Allergies  Allergen Reactions  . Ampicillin Other (See Comments)    Reaction Unknown   . Ciprofloxin Hcl [Ciprofloxacin Hcl] Other (See Comments)    Reaction Unknown   . Demerol Other (See Comments)    Reaction Unknown   . Sulfa Antibiotics Rash    No current facility-administered medications for this visit.   No current outpatient prescriptions on file.   Facility-Administered Medications Ordered in Other Visits    Medication Dose Route Frequency Provider Last Rate Last Dose  . 0.9 %  sodium chloride infusion   Intravenous Continuous Alison Murray, MD 10 mL/hr at 05/06/13 1458    . acetaminophen (TYLENOL) tablet 650 mg  650 mg Oral Q4H PRN Roma Kayser Schorr, NP      . ipratropium (ATROVENT) nebulizer solution 0.5 mg  0.5 mg Nebulization TID Drema Dallas, MD   0.5 mg at 05/08/13 1610   And  . albuterol (PROVENTIL) (5 MG/ML) 0.5% nebulizer solution 2.5 mg  2.5 mg Nebulization TID Drema Dallas, MD   2.5 mg at 05/08/13 0837  . albuterol (PROVENTIL) (5 MG/ML) 0.5% nebulizer solution 2.5 mg  2.5 mg Nebulization Q6H PRN Drema Dallas, MD      . atorvastatin (LIPITOR) tablet 10 mg  10 mg Oral QPM Drema Dallas, MD   10 mg at 05/07/13 1753  . aztreonam (AZACTAM) 1 g in dextrose 5 % 50 mL IVPB  1 g Intravenous Q8H Dannielle Huh, RPH   1 g at 05/08/13 1359  . calcium carbonate (OS-CAL - dosed in mg of elemental calcium) tablet 500 mg of elemental calcium  1 tablet Oral BID Osvaldo Shipper, MD   500 mg of elemental calcium at 05/08/13 1005  . cholecalciferol (VITAMIN D) tablet 1,000 Units  1,000 Units Oral BID Osvaldo Shipper, MD   1,000 Units at 05/08/13 1005  . darifenacin (ENABLEX) 24 hr tablet 15 mg  15 mg Oral Daily Drema Dallas, MD   15 mg at 05/08/13 1005  . dextromethorphan-guaiFENesin (MUCINEX DM) 30-600 MG per 12 hr tablet 1 tablet  1 tablet Oral BID Alison Murray, MD   1 tablet at 05/08/13 1006  . feeding supplement (ENSURE COMPLETE) (ENSURE COMPLETE) liquid 237 mL  237 mL Oral BID BM Lorraine Lax, RD   237 mL at 05/08/13 1404  . HYDROcodone-acetaminophen (NORCO/VICODIN) 5-325 MG per tablet 1-2 tablet  1-2 tablet Oral TID Drema Dallas, MD   1 tablet at 05/08/13 1044  . levofloxacin (LEVAQUIN) IVPB 750 mg  750 mg Intravenous Q48H Dannielle Huh, RPH   750 mg at 05/07/13 1118  . mirtazapine (REMERON) tablet 7.5 mg  7.5 mg Oral QHS Drema Dallas, MD   7.5 mg at 05/06/13 2152  .  mometasone-formoterol (DULERA) 100-5 MCG/ACT inhaler 2 puff  2 puff Inhalation BID Drema Dallas, MD   2 puff at 05/08/13 [phone]  .  multivitamin with minerals tablet 1 tablet  1 tablet Oral Daily Osvaldo Shipper, MD   1 tablet at 05/08/13 1007  . nitroGLYCERIN (NITRODUR - Dosed in mg/24 hr) patch 0.4 mg  0.4 mg Transdermal QPM Drema Dallas, MD   0.4 mg at 05/07/13 1753  . nitroGLYCERIN (NITROSTAT) SL tablet 0.4 mg  0.4 mg Sublingual Q5 min PRN Drema Dallas, MD      . omega-3 acid ethyl esters (LOVAZA) capsule 1 g  1 g Oral BID Osvaldo Shipper, MD   1 g at 05/08/13 1007  . pantoprazole (PROTONIX) EC tablet 40 mg  40 mg Oral BID Drema Dallas, MD   40 mg at 05/08/13 1007  . polyethylene glycol (MIRALAX / GLYCOLAX) packet 17 g  17 g Oral QODAY Drema Dallas, MD   17 g at 05/07/13 [phone]  . potassium chloride (K-DUR,KLOR-CON) CR tablet 10 mEq  10 mEq Oral Daily Drema Dallas, MD   10 mEq at 05/08/13 1007  . vitamin B-12 (CYANOCOBALAMIN) tablet 1,000 mcg  1,000 mcg Oral Daily Osvaldo Shipper, MD   1,000 mcg at 05/08/13 1007  . warfarin (COUMADIN) tablet 2.5 mg  2.5 mg Oral ONCE-1800 Loma Messing [place], [facility]      . Warfarin - Pharmacist Dosing Inpatient   Does not apply q1800 Loma Messing [place], RPH        ROS- all systems are reviewed and negative except as per HPI  Physical Exam: Filed Vitals:   04/29/13 1023  BP: 92/68  Pulse: 60  Height: 5\' 2"  (1.575 m)  Weight: 120 lb 1.9 oz (54.486 kg)    GEN- The patient is elderly appearing, alert and oriented x 3 today.   Head- normocephalic, atraumatic Eyes-  Sclera clear, conjunctiva pink Ears- hearing intact Oropharynx- clear Neck- supple  Lungs- Clear to ausculation bilaterally, normal work of breathing Chest- pacemaker pocket is well healed Heart- Regular rate and rhythm  GI- soft, NT, ND, + BS Extremities- no clubbing, cyanosis, + venous stasis changes with L>R edema MS- no significant deformity or atrophy Skin- no rash or  lesion Psych- euthymic mood, full affect Neuro- strength and sensation are intact  Pacemaker interrogation- reviewed in detail today,  See PACEART report  Assessment and Plan:  1. Sick sinus syndrome Normal pacemaker function See Pace Art report No changes today  2. Hypotension Decreased BP today with some fatigue.  No signs of bleeding Decrease toprol today Follow-up with PCP if not improved  3. afib Well controlled Continue coumadin  Return to the device clinic in 6 months I will see in a year

## 2013-05-08 NOTE — Progress Notes (Signed)
Physical Therapy Treatment Patient Details Name: [name] MRN: [id number] DOB: January 16, 1916 Today's Date: 05/08/2013 Time: 4742-5956 PT Time Calculation (min): 31 min  PT Assessment / Plan / Recommendation  History of Present Illness 79 yo female admitted with fever, SOB. Hx of A fib, TIA, osteopenia.    PT Comments   Pt ambulated x 150' with 2 L O2. Pt dyspneic but very motivated to participate. Plans SNF  Follow Up Recommendations  SNF     Does the patient have the potential to tolerate intense rehabilitation     Barriers to Discharge        Equipment Recommendations  None recommended by PT    Recommendations for Other Services    Frequency Min 3X/week   Progress towards PT Goals Progress towards PT goals: Progressing toward goals  Plan Current plan remains appropriate    Precautions / Restrictions Precautions Precautions: Fall Precaution Comments: monitor sats off of O2.   Pertinent Vitals/Pain sats >92% 2 l.    Mobility  Bed Mobility Supine to Sit: 4: Min assist Sit to Supine: 4: Min guard Details for Bed Mobility Assistance: Assist for trunk and bil LEs. Increased time.  Transfers Sit to Stand: 4: Min assist;From bed;From chair/3-in-1;With upper extremity assist Stand to Sit: 4: Min assist;To bed;With upper extremity assist Details for Transfer Assistance: Assist to rise, stabilize, control descent. Stand pivot to [facility] Ambulation/Gait Ambulation/Gait Assistance: 4: Min assist Ambulation Distance (Feet): 150 Feet Assistive device: 4-wheeled walker Ambulation/Gait Assistance Details: used O2,  dyspnea 3, slow speed, rest breaks standing. Gait Pattern: Step-through pattern;Trunk flexed    Exercises     PT Diagnosis:    PT Problem List:   PT Treatment Interventions:     PT Goals (current goals can now be found in the care plan section)    Visit Information  Last PT Received On: 05/08/13 Assistance Needed: +1 History of Present Illness: 79 yo  female admitted with fever, SOB. Hx of A fib, TIA, osteopenia.     Subjective Data      Cognition  Cognition Arousal/Alertness: Awake/alert    Balance     End of Session PT - End of Session Equipment Utilized During Treatment: Oxygen Activity Tolerance: Patient tolerated treatment well Patient left: in bed;with call bell/phone within reach Nurse Communication: Mobility status   GP     Rada Hay 05/08/2013, 3:46 PM Blanchard Kelch PT [phone]

## 2013-05-08 NOTE — Progress Notes (Signed)
ANTICOAGULATION CONSULT NOTE - Follow-up  Pharmacy Consult for Warfarin  Indication: atrial fibrillation  Allergies  Allergen Reactions  . Ampicillin Other (See Comments)    Reaction Unknown   . Ciprofloxin Hcl [Ciprofloxacin Hcl] Other (See Comments)    Reaction Unknown   . Demerol Other (See Comments)    Reaction Unknown   . Sulfa Antibiotics Rash    Patient Measurements: Height: 5\' 2"  (157.5 cm) (Entered from previous admission ) Weight: 120 lb (54.432 kg) (entered from previous admission ) IBW/kg (Calculated) : 50.1   Vital Signs: Temp: 98.4 F (36.9 C) (12/26 0516) Temp src: Axillary (12/26 0516) BP: 125/64 mmHg (12/26 0516) Pulse Rate: 60 (12/26 0516)  Labs:  Recent Labs  05/06/13 0430 05/07/13 0355 05/08/13 0405  HGB 9.9* 9.6*  --   HCT 30.7* 29.8*  --   PLT 158 166  --   LABPROT 35.2* 27.5* 22.1*  INR 3.69* 2.67* 2.00*  CREATININE 0.57 0.54  --     Estimated Creatinine Clearance: 33.3 ml/min (by C-G formula based on Cr of 0.54).   Assessment: 79 yo F admitted 12/22 with HCAP, on chronic anticoagulation with warfarin for A.fib.   Home warfarin dose reported as 5 mg on Mondays and Fridays and 2.5 mg on all other days; Last dose reported as 12/21  INR down into therapeutic range today after holding dose x 2 days and giving low dose yesterday of 1 mg.  INR trending down now, 2.0 today   H/H stable, no bleeding noted  Plts low/stable, baseline unknown   Tolerating diet.  Broad-spectrum abx will increase sensitivity to warfarin.  Goal of Therapy:  INR 2-3 Monitor platelets by anticoagulation protocol: Yes   Plan:   Give warfarin 2.5 mg tonight at 1800   Daily PT/INR   Amy Belloso, Loma Messing PharmD Pager #: [phone] 9:50 AM 05/08/2013

## 2013-05-09 LAB — PROTIME-INR
INR: 1.87 — ABNORMAL HIGH (ref 0.00–1.49)
Prothrombin Time: 21 seconds — ABNORMAL HIGH (ref 11.6–15.2)

## 2013-05-09 MED ORDER — WARFARIN SODIUM 2.5 MG PO TABS
2.5000 mg | ORAL_TABLET | Freq: Once | ORAL | Status: AC
  Administered 2013-05-09: 2.5 mg via ORAL
  Filled 2013-05-09: qty 1

## 2013-05-09 NOTE — Progress Notes (Signed)
ANTICOAGULATION CONSULT NOTE - Follow-up  Pharmacy Consult for Warfarin  Indication: atrial fibrillation  Allergies  Allergen Reactions  . Ampicillin Other (See Comments)    Reaction Unknown   . Ciprofloxin Hcl [Ciprofloxacin Hcl] Other (See Comments)    Reaction Unknown   . Demerol Other (See Comments)    Reaction Unknown   . Sulfa Antibiotics Rash    Patient Measurements: Height: 5\' 2"  (157.5 cm) (Entered from previous admission ) Weight: 120 lb (54.432 kg) (entered from previous admission ) IBW/kg (Calculated) : 50.1   Vital Signs: Temp: 98.3 F (36.8 C) (12/27 0500) Temp src: Oral (12/27 0500) BP: 128/64 mmHg (12/27 0500) Pulse Rate: 60 (12/27 0500)  Labs:  Recent Labs  05/07/13 0355 05/08/13 0405 05/09/13 0410  HGB 9.6*  --   --   HCT 29.8*  --   --   PLT 166  --   --   LABPROT 27.5* 22.1* 21.0*  INR 2.67* 2.00* 1.87*  CREATININE 0.54  --   --     Estimated Creatinine Clearance: 33.3 ml/min (by C-G formula based on Cr of 0.54).   Assessment: 79 yo F admitted 12/22 with HCAP, on chronic anticoagulation with warfarin for A.fib.   Home warfarin dose reported as 5 mg on Mondays and Fridays and 2.5 mg on all other days; Last dose reported as 12/21  INR now subtherapeutic 1.87 after holding doses x 2 for supratx INR 12/24  H/H low 12/25, no bleeding noted  Plts also low, baseline unknown   Broad-spectrum abx particularly levaquin,  will increase sensitivity to warfarin.  Goal of Therapy:  INR 2-3 Monitor platelets by anticoagulation protocol: Yes   Plan:   Give warfarin 2.5 mg today at 12 noon  Daily PT/INR   Loralee Pacas, PharmD, BCPS Pager: [phone] 7:44 AM 05/09/2013

## 2013-05-09 NOTE — Progress Notes (Signed)
[facility] PROGRESS NOTE  [name] ZOX:[id number] DOB: Jun 21, 1915 DOA: 05/04/2013 PCP: Londell Moh, MD  Brief narrative: 79 year old female with past medical history of atrial fibrillation, on coumadin, pacemaker, CAD with multiple cardiac stents, history of TIA's, dyslipidemia, from [facility] [place] [facility] who presented to [facility] ED 05/04/2013 with.increasing productive cough,congestion and fever of 102 F. In ED, pt was hypoxic. CXR revealed cardiomegaly and interstitial opacity at bases, mild CHF, less likely pneumonia. Because of the fever and hypoxia pt was started on broad spectrum antibiotics for possible HCAP. Hospital course is complicated with the development of bacteremia, gram-positive rods in one of the blood cultures.   Assessment/Plan:   Principal Problem:  HCAP (healthcare-associated pneumonia)  - pt started on broad spectrum coverage, aztreonam and vancomycin, considering fever and hypoxia, treatment for HCAP. We will continue aztreonam and Levaquin. Patient was initially also on vancomycin but this was discontinued 05/07/2013  - blood cultures- one blood culture is growing gram-positive rod; the other blood culture shows no growth - Sputum culture- growing Pseudomonas  - continue oxygen support via nasal canula to keep O2 saturation above 90%  - continue duoneb every 8 hours scheduled; continue dulera BID; albuterol as needed for shortness of breath and/or wheezing every 6 hours  Active Problems:  Gram-positive bacteremia  - Repeat blood cultures 05/08/2013 - Continue aztreonam and Levaquin  HYPERTENSION  - BP 128/64  CAD and stents placement, cardiac pacemaker  - troponins WNL since the admission  - nitroglycerin PRN for chest pain  - continue statin therapy  Paroxysmal atrial fibrillation  - on coumadin  - coumadin per pharmacy dosing  HLD (hyperlipidemia)  - continue statin therapy and omega 3 fatty acid  Hypokalemia  - on low daily dose  potassium supplementation  - would try to keep potassium at 4  - potassium 4.1  Anemia of chronic disease  - likely related to chronic anticoagulation  - hemoglobin stable at 9.6  Acute on chronic diastolic CHF  - ProBNP 931 on this admission; last 2 D ECHO on file in 2013 with EF of 60%  - troponin negative x 3  - given Lasix iv x 1 dose 05/05/13  Moderate protein calorie malnutrition  - continue ensure supplements    Code Status: full code  Family Communication: no family at the bedside  Disposition Plan: to SNF once stable   Consultants:  None  Procedures:  None  Antibiotics  Aztreonam 05/04/2013 -->  Vanco 05/04/2013 --> 05/07/2013  Levaquin 05/07/2013 -->   Manson Passey, MD  [facility] Pager [phone]  If 7PM-7AM, please contact night-coverage [email or web address] Password [facility] 05/09/2013, 7:13 AM   LOS: 5 days    HPI/Subjective: No overnight events.   Objective: Filed Vitals:   05/08/13 0837 05/08/13 1400 05/08/13 2134 05/09/13 0500  BP:  112/58 120/51 128/64  Pulse:  60 62 60  Temp:  98.4 F (36.9 C) 98.1 F (36.7 C) 98.3 F (36.8 C)  TempSrc:  Oral Oral Oral  Resp:  18 18 16   Height:      Weight:      SpO2: 95% 95% 98% 99%    Intake/Output Summary (Last 24 hours) at 05/09/13 8295 Last data filed at 05/09/13 0504  Gross per 24 hour  Intake    560 ml  Output    500 ml  Net     60 ml    Exam:   General:  Pt is sleeping, not in acute distress  Cardiovascular:  Regular rate and rhythm, S1/S2 appreciated  Respiratory: Clear to auscultation bilaterally, no wheezing, no crackles, no rhonchi  Abdomen: Soft, non tender, non distended, bowel sounds present, no guarding  Extremities: No edema, pulses DP and PT palpable bilaterally  Neuro: Grossly nonfocal  Data Reviewed: Basic Metabolic Panel:  Recent Labs Lab 05/04/13 1547 05/04/13 1949 05/05/13 0140 05/06/13 0430 05/07/13 0355  NA 135  --  133* 137 137  K 3.8  --  3.8 3.3* 4.1   CL 98  --  98 103 105  CO2 24  --  24 26 24   GLUCOSE 138*  --  108* 104* 100*  BUN 19  --  19 14 13   CREATININE 0.75 0.71 0.68 0.57 0.54  CALCIUM 9.2  --  8.7 9.0 9.3  MG  --  2.1  --   --   --    Liver Function Tests:  Recent Labs Lab 05/05/13 0140  AST 28  ALT 20  ALKPHOS 75  BILITOT 0.6  PROT 6.0  ALBUMIN 2.8*   No results found for this basename: LIPASE, AMYLASE,  in the last 168 hours No results found for this basename: AMMONIA,  in the last 168 hours CBC:  Recent Labs Lab 05/04/13 1547 05/04/13 1949 05/05/13 0251 05/06/13 0430 05/07/13 0355  WBC 9.0 8.8 8.6 6.2 6.3  NEUTROABS 7.5  --  6.0  --   --   HGB 11.3* 11.0* 9.9* 9.9* 9.6*  HCT 34.4* 34.2* 29.9* 30.7* 29.8*  MCV 93.2 94.7 94.3 95.0 94.3  PLT 163 166 158 158 166   Cardiac Enzymes:  Recent Labs Lab 05/04/13 1547 05/04/13 1949 05/05/13 0140  TROPONINI <0.30 <0.30 <0.30   BNP: No components found with this basename: POCBNP,  CBG: No results found for this basename: GLUCAP,  in the last 168 hours  CULTURE, BLOOD (ROUTINE X 2)     Status: None   Collection Time    05/04/13  4:03 PM      Result Value Range Status   Specimen Description BLOOD LEFT ANTECUBITAL   Final   Value: GRAM POSITIVE RODS     Performed at [facility]   Report Status PENDING   Incomplete  CULTURE, BLOOD (ROUTINE X 2)     Status: None   Collection Time    05/04/13  4:37 PM      Result Value Range Status   Specimen Description BLOOD RIGHT WRIST   Final   Value:        BLOOD CULTURE RECEIVED NO GROWTH TO DATE      Performed at [facility]   Report Status PENDING   Incomplete  CULTURE, RESPIRATORY (NON-EXPECTORATED)     Status: None   Collection Time    05/04/13 10:38 PM      Result Value Range Status   Specimen Description SPUTUM   Final   Organism ID, Bacteria PSEUDOMONAS AERUGINOSA   Final     Studies: No results found.  Scheduled Meds: . ipratropium  0.5 mg Nebulization TID   And  .  albuterol  2.5 mg Nebulization TID  . atorvastatin  10 mg Oral QPM  . aztreonam  1 g Intravenous Q8H  . calcium carbonate  1 tablet Oral BID  . cholecalciferol  1,000 Units Oral BID  . darifenacin  15 mg Oral Daily  . dextromethorphan-guaiFENesin  1 tablet Oral BID  . feeding supplement (ENSURE COMPLETE)  237 mL Oral BID BM  . HYDROcodone-acetaminophen  1-2  tablet Oral TID  . levofloxacin (LEVAQUIN) IV  750 mg Intravenous Q48H  . mirtazapine  7.5 mg Oral QHS  . mometasone-formoterol  2 puff Inhalation BID  . multivitamin with minerals  1 tablet Oral Daily  . nitroGLYCERIN  0.4 mg Transdermal QPM  . omega-3 acid ethyl esters  1 g Oral BID  . pantoprazole  40 mg Oral BID  . polyethylene glycol  17 g Oral QODAY  . potassium chloride  10 mEq Oral Daily  . vitamin B-12  1,000 mcg Oral Daily  . Warfarin - Pharmacist Dosing Inpatient   Does not apply q1800   Continuous Infusions: . sodium chloride 10 mL/hr at 05/06/13 1458

## 2013-05-10 LAB — PROTIME-INR
INR: 1.98 — ABNORMAL HIGH (ref 0.00–1.49)
Prothrombin Time: 21.9 seconds — ABNORMAL HIGH (ref 11.6–15.2)

## 2013-05-10 LAB — CULTURE, BLOOD (ROUTINE X 2)

## 2013-05-10 MED ORDER — WARFARIN SODIUM 2.5 MG PO TABS
2.5000 mg | ORAL_TABLET | Freq: Once | ORAL | Status: AC
  Administered 2013-05-10: 2.5 mg via ORAL
  Filled 2013-05-10: qty 1

## 2013-05-10 MED ORDER — LEVOFLOXACIN 750 MG PO TABS: 750.0000 mg | ORAL_TABLET | ORAL | Status: DC

## 2013-05-10 MED ORDER — CIPROFLOXACIN HCL 500 MG PO TABS
500.0000 mg | ORAL_TABLET | Freq: Two times a day (BID) | ORAL | Status: DC
  Administered 2013-05-10 – 2013-05-11 (×3): 500 mg via ORAL
  Filled 2013-05-10 (×5): qty 1

## 2013-05-10 MED ORDER — DOCUSATE SODIUM 100 MG PO CAPS
100.0000 mg | ORAL_CAPSULE | Freq: Two times a day (BID) | ORAL | Status: DC
  Administered 2013-05-10 – 2013-05-11 (×2): 100 mg via ORAL
  Filled 2013-05-10 (×4): qty 1

## 2013-05-10 MED ORDER — POLYETHYLENE GLYCOL 3350 17 G PO PACK
17.0000 g | PACK | Freq: Two times a day (BID) | ORAL | Status: DC
  Administered 2013-05-10: 17 g via ORAL
  Filled 2013-05-10 (×4): qty 1

## 2013-05-10 NOTE — Progress Notes (Signed)
ANTICOAGULATION CONSULT NOTE - Follow-up  Pharmacy Consult for Warfarin  Indication: atrial fibrillation  Allergies  Allergen Reactions  . Ampicillin Other (See Comments)    Reaction Unknown   . Ciprofloxin Hcl [Ciprofloxacin Hcl] Other (See Comments)    Reaction Unknown   . Demerol Other (See Comments)    Reaction Unknown   . Sulfa Antibiotics Rash    Patient Measurements: Height: 5\' 2"  (157.5 cm) (Entered from previous admission ) Weight: 120 lb (54.432 kg) (entered from previous admission ) IBW/kg (Calculated) : 50.1   Vital Signs: Temp: 98.7 F (37.1 C) (12/28 0504) Temp src: Oral (12/28 0504) BP: 120/54 mmHg (12/28 0504) Pulse Rate: 62 (12/28 0504)  Labs:  Recent Labs  05/08/13 0405 05/09/13 0410 05/10/13 0425  LABPROT 22.1* 21.0* 21.9*  INR 2.00* 1.87* 1.98*    Estimated Creatinine Clearance: 33.3 ml/min (by C-G formula based on Cr of 0.54).   Assessment: 79 yo F admitted 12/22 with HCAP, on chronic anticoagulation with warfarin for A.fib.   Home warfarin dose reported as 5 mg on Mondays and Fridays and 2.5 mg on all other days; Last dose reported as 12/21  INR almost therapeutic 1.98, was subtherapeutic yesterday after holding doses x 2 for supratx INR 12/24  H/H low 12/25, no bleeding noted  Plts also low, baseline unknown   Broad-spectrum abx particularly levaquin,  will increase sensitivity to warfarin.  Goal of Therapy:  INR 2-3 Monitor platelets by anticoagulation protocol: Yes   Plan:   Repeat warfarin 2.5 mg today at 12 noon  Daily PT/INR while inpatient   Loralee Pacas, PharmD, BCPS Pager: [phone] 8:14 AM 05/10/2013

## 2013-05-10 NOTE — Progress Notes (Signed)
ANTIBIOTIC CONSULT NOTE - FOLLOW UP  Pharmacy Consult for levofloxacin/aztreonam Indication: HCAP  Allergies  Allergen Reactions  . Ampicillin Other (See Comments)    Reaction Unknown   . Ciprofloxin Hcl [Ciprofloxacin Hcl] Other (See Comments)    Reaction Unknown   . Demerol Other (See Comments)    Reaction Unknown   . Sulfa Antibiotics Rash    Patient Measurements: Height: 5\' 2"  (157.5 cm) (Entered from previous admission ) Weight: 120 lb (54.432 kg) (entered from previous admission ) IBW/kg (Calculated) : 50.1  Vital Signs: Temp: 98.7 F (37.1 C) (12/28 0504) Temp src: Oral (12/28 0504) BP: 120/54 mmHg (12/28 0504) Pulse Rate: 62 (12/28 0504) Intake/Output from previous day: 12/27 0701 - 12/28 0700 In: 787.3 [P.O.:240; I.V.:217.3; IV Piggyback:250] Out: 650 [Urine:650] Intake/Output from this shift:    Labs: No results found for this basename: WBC, HGB, PLT, LABCREA, CREATININE,  in the last 72 hours Estimated Creatinine Clearance: 33.3 ml/min (by C-G formula based on Cr of 0.54). No results found for this basename: VANCOTROUGH, Leodis Binet, VANCORANDOM, GENTTROUGH, GENTPEAK, GENTRANDOM, TOBRATROUGH, TOBRAPEAK, TOBRARND, AMIKACINPEAK, AMIKACINTROU, AMIKACIN,  in the last 72 hours   Microbiology: 12/22 Blood x 2: 1/2 GPR (likely contaminant) 12/22 sputum: Abundant pseudomonas - sens all agents tested 12/22 flu panel: negative 12/22 Spneumo Ur Ag: negative 12/22 Legionella Ur Ag: negative  Anti-infectives: 12/23 >> Vancomycin >> 12/25 12/22 >> Aztreonam >> 12/25 >> Levofloxacin >>   Assessment: 79 yo presents from assisted living facility with tachypnea and hypoxia, started on broad spectrum antibiotics to treat HCAP.  D7 Azactam 1 gram IV q8h / D4 Levaquin 750mg  IV q48h - tolerating levaquin despite questionable cipro allergy  Afebrile x 72hr  WBC wnl on 12/25 - check in am  SCr wnl and stable (0.54) on 12/25 - check in am  CrCl 33 ml/min  Cultures as  above  Goal of Therapy:  Appropriately dose antibiotics for age and renal function  Plan:   Continue Levaquin 750mg  q48h, change to PO with next dose  Doubt that double coverage of Pseudomonas is needed at this point since isolate is sensitive to cipro and can infer sensitivity to levaquin  Aztreonam 1g q8h remains appropriate, but recommend dc'ing this  Loralee Pacas, PharmD, BCPS Pager: [phone]  05/10/2013,8:23 AM

## 2013-05-10 NOTE — Progress Notes (Addendum)
[facility] PROGRESS NOTE  ALEEN MARSTON WUJ:[id number] DOB: 1915/09/15 DOA: 05/04/2013 PCP: Londell Moh, MD  Brief narrative: 79 year old female with past medical history of atrial fibrillation, on coumadin, pacemaker, CAD with multiple cardiac stents, history of TIA's, dyslipidemia, from [facility] who presented to [facility] ED 05/04/2013 with.increasing productive cough,congestion and fever of 102 F. In ED, pt was hypoxic. CXR revealed cardiomegaly and interstitial opacity at bases, mild CHF, less likely pneumonia. Because of the fever and hypoxia pt was started on broad spectrum antibiotics for HCAP. Pt has respiratory cultures positive for pseudomonas sensitive to cipro. I spoke with infectious disease, Dr. Orvan Falconer and he recommended treating with total of 8 days (including what she has gotten in hospital).  Assessment/Plan:   Principal Problem:  HCAP (healthcare-associated pneumonia)  - Patient initially started on broad spectrum coverage, aztreonam and vancomycin, considering fever and hypoxia, treatment for HCAP. Patient was initially also on vancomycin but this was discontinued 05/07/2013  - One blood culture from 05/04/2013 is growing diphtheroid, corynebacterium ; the other blood culture from the same date shows no growth. Blood cultures were repeated on 05/08/2013 and those are showing no growth to date - Sputum culture- growing Pseudomonas sensitive to Cipro - I spoke with ID on call and recommendation was to trewat for pseudomonas for total of 8 days including what she has gotten in hospital so far. Corynebacterium is likely contaminant and does not require treatment per ID. - continue oxygen support via nasal canula to keep O2 saturation above 90%  - continue bronchodilator treatments Active Problems:  Gram-positive bacteremia, diphtheroid, corynebacterium bacteremia - one of the blood cultures drawn on 05/04/2013; likely contaminant  - Repeat blood  cultures 05/08/2013 - show no growth to date HYPERTENSION  - reasonable control inpatient  CAD and stents placement, cardiac pacemaker  - troponins WNL since the admission  - nitroglycerin PRN for chest pain  - continue statin therapy  Paroxysmal atrial fibrillation  - on coumadin  - coumadin per pharmacy dosing  HLD (hyperlipidemia)  - continue statin therapy and omega 3 fatty acid  Hypokalemia  - on low daily dose potassium supplementation  - would try to keep potassium at 4  - potassium 4.1  Anemia of chronic disease  - likely related to chronic anticoagulation  - hemoglobin stable at 9.6  Acute on chronic diastolic CHF  - ProBNP 931 on this admission; last 2 D ECHO on file in 2013 with EF of 60%  - troponin negative x 3  - given Lasix iv x 1 dose 05/05/13  Moderate protein calorie malnutrition  - continue ensure supplements   Code Status: full code  Family Communication: no family at the bedside  Disposition Plan: to SNF once stable; likely in am  Consultants:  None  Procedures:  None  Antibiotics  Aztreonam 05/04/2013 -->  Vanco 05/04/2013 --> 05/07/2013  Levaquin 05/07/2013 -->   Manson Passey, MD  [facility] Pager [phone]  If 7PM-7AM, please contact night-coverage [email or web address] Password [facility] 05/10/2013, 10:56 AM   LOS: 6 days    HPI/Subjective: No acute overnight events.   Objective: Filed Vitals:   05/09/13 1335 05/09/13 2046 05/10/13 0504 05/10/13 0744  BP: 127/55 116/48 120/54   Pulse: 64 93 62   Temp: 98 F (36.7 C) 98.7 F (37.1 C) 98.7 F (37.1 C)   TempSrc: Oral Oral Oral   Resp: 16 18 18    Height:      Weight:  SpO2: 100% 97% 97% 98%    Intake/Output Summary (Last 24 hours) at 05/10/13 1056 Last data filed at 05/10/13 0400  Gross per 24 hour  Intake 787.33 ml  Output    650 ml  Net 137.33 ml    Exam:   General:  Pt is alert, follows commands appropriately, not in acute distress  Cardiovascular: Regular rate  and rhythm, S1/S2 appreciated  Respiratory: Clear to auscultation bilaterally, no wheezing, no crackles, no rhonchi  Abdomen: Soft, non tender, non distended, bowel sounds present, no guarding  Extremities: No edema, pulses DP and PT palpable bilaterally  Neuro: Grossly nonfocal  Data Reviewed: Basic Metabolic Panel:  Recent Labs Lab 05/04/13 1547 05/04/13 1949 05/05/13 0140 05/06/13 0430 05/07/13 0355  NA 135  --  133* 137 137  K 3.8  --  3.8 3.3* 4.1  CL 98  --  98 103 105  CO2 24  --  24 26 24   GLUCOSE 138*  --  108* 104* 100*  BUN 19  --  19 14 13   CREATININE 0.75 0.71 0.68 0.57 0.54  CALCIUM 9.2  --  8.7 9.0 9.3  MG  --  2.1  --   --   --    Liver Function Tests:  Recent Labs Lab 05/05/13 0140  AST 28  ALT 20  ALKPHOS 75  BILITOT 0.6  PROT 6.0  ALBUMIN 2.8*   No results found for this basename: LIPASE, AMYLASE,  in the last 168 hours No results found for this basename: AMMONIA,  in the last 168 hours CBC:  Recent Labs Lab 05/04/13 1547 05/04/13 1949 05/05/13 0251 05/06/13 0430 05/07/13 0355  WBC 9.0 8.8 8.6 6.2 6.3  NEUTROABS 7.5  --  6.0  --   --   HGB 11.3* 11.0* 9.9* 9.9* 9.6*  HCT 34.4* 34.2* 29.9* 30.7* 29.8*  MCV 93.2 94.7 94.3 95.0 94.3  PLT 163 166 158 158 166   Cardiac Enzymes:  Recent Labs Lab 05/04/13 1547 05/04/13 1949 05/05/13 0140  TROPONINI <0.30 <0.30 <0.30   BNP: No components found with this basename: POCBNP,  CBG: No results found for this basename: GLUCAP,  in the last 168 hours  CULTURE, BLOOD (ROUTINE X 2)     Status: None   Collection Time    05/04/13  4:03 PM      Result Value Range Status   Specimen Description BLOOD LEFT ANTECUBITAL   Final   Value: DIPHTHEROIDS(CORYNEBACTERIUM SPECIES)     Performed at [facility]   Report Status 05/10/2013 FINAL   Final  CULTURE, BLOOD (ROUTINE X 2)     Status: None   Collection Time    05/04/13  4:37 PM      Result Value Range Status   Specimen Description  BLOOD RIGHT WRIST   Final   Value:        BLOOD CULTURE RECEIVED NO GROWTH TO DATE     Performed at [facility]   Report Status PENDING   Incomplete  CULTURE, RESPIRATORY (NON-EXPECTORATED)     Status: None   Collection Time    05/04/13 10:38 PM      Result Value Range Status   Specimen Description SPUTUM   Final   Organism ID, Bacteria PSEUDOMONAS AERUGINOSA   Final  CULTURE, BLOOD (ROUTINE X 2)     Status: None   Collection Time    05/08/13 11:30 AM      Result Value Range Status   Specimen  Description BLOOD RIGHT ARM   Final   Value:        BLOOD CULTURE RECEIVED NO GROWTH TO DATE     Performed at [facility]   Report Status PENDING   Incomplete  CULTURE, BLOOD (ROUTINE X 2)     Status: None   Collection Time    05/08/13 11:40 AM      Result Value Range Status   Specimen Description BLOOD RIGHT HAND   Final   Value:        BLOOD CULTURE RECEIVED NO GROWTH TO DATE      Performed at [facility]   Report Status PENDING   Incomplete     Studies: No results found.  Scheduled Meds: . ipratropium  0.5 mg Nebulization TID   And  . albuterol  2.5 mg Nebulization TID  . atorvastatin  10 mg Oral QPM  . aztreonam  1 g Intravenous Q8H  . calcium carbonate  1 tablet Oral BID  . cholecalciferol  1,000 Units Oral BID  . darifenacin  15 mg Oral Daily  . dextromethorphan-gua  1 tablet Oral BID  . docusate sodium  100 mg Oral BID  . HYDROcodone-acetaminophen  1-2 tablet Oral TID  . levofloxacin  750 mg Oral Q48H  . mirtazapine  7.5 mg Oral QHS  . mometasone-formoterol  2 puff Inhalation BID  . multivitamin  1 tablet Oral Daily  . omega-3 acid ethyl   1 g Oral BID  . pantoprazole  40 mg Oral BID  . polyethylene glycol  17 g Oral BID  . potassium chloride  10 mEq Oral Daily  . vitamin B-12  1,000 mcg Oral Daily  . warfarin  2.5 mg Oral Once

## 2013-05-11 ENCOUNTER — Inpatient Hospital Stay (HOSPITAL_COMMUNITY): Payer: Medicare Other

## 2013-05-11 ENCOUNTER — Encounter: Payer: Self-pay | Admitting: Internal Medicine

## 2013-05-11 LAB — PROTIME-INR
INR: 2.14 — ABNORMAL HIGH (ref 0.00–1.49)
Prothrombin Time: 23.2 seconds — ABNORMAL HIGH (ref 11.6–15.2)

## 2013-05-11 LAB — CBC
HCT: 29 % — ABNORMAL LOW (ref 36.0–46.0)
MCV: 95.1 fL (ref 78.0–100.0)
Platelets: 232 10*3/uL (ref 150–400)
RBC: 3.05 MIL/uL — ABNORMAL LOW (ref 3.87–5.11)
RDW: 13.5 % (ref 11.5–15.5)
WBC: 7.1 10*3/uL (ref 4.0–10.5)

## 2013-05-11 LAB — BASIC METABOLIC PANEL
CO2: 26 mEq/L (ref 19–32)
Calcium: 9.1 mg/dL (ref 8.4–10.5)
Chloride: 107 mEq/L (ref 96–112)
GFR calc Af Amer: >90 mL/min (ref >90)
Potassium: 4.1 mEq/L (ref 3.5–5.1)
Sodium: 139 mEq/L (ref 135–145)

## 2013-05-11 MED ORDER — WARFARIN SODIUM 2.5 MG PO TABS
2.5000 mg | ORAL_TABLET | Freq: Once | ORAL | Status: DC
  Filled 2013-05-11: qty 1

## 2013-05-11 MED ORDER — ENSURE COMPLETE PO LIQD: 237.0000 mL | Freq: Two times a day (BID) | ORAL | Status: DC

## 2013-05-11 MED ORDER — MAGNESIUM HYDROXIDE 400 MG/5ML PO SUSP: 5.0000 mL | Freq: Every day | ORAL | Status: DC | PRN

## 2013-05-11 MED ORDER — HYDROCODONE-ACETAMINOPHEN 5-325 MG PO TABS: 1.0000 | ORAL_TABLET | Freq: Three times a day (TID) | ORAL | Status: DC

## 2013-05-11 MED ORDER — ALBUTEROL SULFATE (5 MG/ML) 0.5% IN NEBU: 2.5000 mg | INHALATION_SOLUTION | Freq: Four times a day (QID) | RESPIRATORY_TRACT | Status: AC | PRN

## 2013-05-11 MED ORDER — BISACODYL 10 MG RE SUPP
10.0000 mg | Freq: Every day | RECTAL | Status: DC | PRN
  Administered 2013-05-11: 10 mg via RECTAL
  Filled 2013-05-11: qty 1

## 2013-05-11 MED ORDER — BISACODYL 10 MG RE SUPP: 10.0000 mg | Freq: Every day | RECTAL | Status: DC | PRN

## 2013-05-11 MED ORDER — CIPROFLOXACIN HCL 500 MG PO TABS: 500.0000 mg | ORAL_TABLET | Freq: Two times a day (BID) | ORAL | Status: DC

## 2013-05-11 MED ORDER — MIRTAZAPINE 7.5 MG PO TABS: 7.5000 mg | ORAL_TABLET | Freq: Every day | ORAL | Status: DC

## 2013-05-11 MED ORDER — DSS 100 MG PO CAPS: 100.0000 mg | ORAL_CAPSULE | Freq: Two times a day (BID) | ORAL | Status: DC

## 2013-05-11 NOTE — Progress Notes (Signed)
Patient is set to discharge back to [facility], but SNF rather than ALF. Patient & son, Ree Kida at bedside aware & agreeable. Discharge packet given to RN, Bernette Redbird to call report. Son, Ree Kida to transport to SNF when ready.   Unice Bailey, LCSW [facility] Clinical Social Worker cell #: [phone]

## 2013-05-11 NOTE — Progress Notes (Signed)
ANTICOAGULATION CONSULT NOTE - Follow-up  Pharmacy Consult for Warfarin  Indication: atrial fibrillation  Allergies  Allergen Reactions  . Ampicillin Other (See Comments)    Reaction Unknown   . Ciprofloxin Hcl [Ciprofloxacin Hcl] Other (See Comments)    Reaction Unknown, has received Levaquin 12/14  . Demerol Other (See Comments)    Reaction Unknown   . Sulfa Antibiotics Rash    Patient Measurements: Height: 5\' 2"  (157.5 cm) (Entered from previous admission ) Weight: 120 lb (54.432 kg) (entered from previous admission ) IBW/kg (Calculated) : 50.1   Vital Signs: Temp: 97.7 F (36.5 C) (12/29 0509) Temp src: Oral (12/29 0509) BP: 130/65 mmHg (12/29 0509) Pulse Rate: 60 (12/29 0509)  Labs:  Recent Labs  05/09/13 0410 05/10/13 0425 05/11/13 0406  HGB  --   --  9.3*  HCT  --   --  29.0*  PLT  --   --  232  LABPROT 21.0* 21.9* 23.2*  INR 1.87* 1.98* 2.14*  CREATININE  --   --  0.54    Estimated Creatinine Clearance: 33.3 ml/min (by C-G formula based on Cr of 0.54).   Assessment: 79 yo F admitted 12/22 with HCAP, on chronic anticoagulation with warfarin for A.fib.   Home warfarin dose reported as 5 mg on Mondays and Fridays and 2.5 mg on all other days; Last dose reported as 12/21  INR now therapeutic at 2.14 after doses of 1mg , 2.5mg  x 3 doses since 12/25  H/H lstable, no bleeding noted  Plts stable  Currently on Cipro, will increase sensitivity to warfarin.  Goal of Therapy:  INR 2-3 Monitor platelets by anticoagulation protocol: Yes   Plan:  1) Repeat 2.5mg  warfarin today - monitor closely though with concurrent Cipro 2) Daily INR  Hessie Knows, PharmD, BCPS Pager [phone] 05/11/2013 8:10 AM

## 2013-05-11 NOTE — Progress Notes (Signed)
Patient to be transported to [facility] by her son via car.  Report called to Aurther Loft, Charity fundraiser.  Patient stable for discharge.  Assessment unchanged from this am.  Discharge packet given to patient's son to give to facility.  Allayne Butcher [facility]  05/11/2013  3:08 PM

## 2013-05-11 NOTE — Progress Notes (Signed)
Patient had 2 large bowel movements after receiving suppository.  Also, patient was weaned to room air and is sating mid 90's at rest and with ambulation in the room.  Allayne Butcher [facility]  05/11/2013

## 2013-05-11 NOTE — Discharge Summary (Signed)
Physician Discharge Summary  [name] ZOX:[id number] DOB: January 07, 1916 DOA: 05/04/2013  PCP: Londell Moh, MD  Admit date: 05/04/2013 Discharge date: 05/11/2013  Recommendations for Outpatient Follow-up:  1. Please note that your sputum culture grew pseudomonas species which we are treating with ccipro 500 mg twice a day and you will take this for 1 more day in SNF. 2. Take coumadin as before, 2.5 mg tonight 05/12/2103 and please recheck INR pre nursing home protocol and adjust coumadin dose appropriately to reflect INR goal range 2-3. INR this morning 05/11/2013 is 2.14   Discharge Diagnoses:  Principal Problem:   HCAP (healthcare-associated pneumonia) Active Problems:   HYPERTENSION   CAD   Paroxysmal atrial fibrillation   Cardiac pacemaker in situ   Long-term (current) use of anticoagulants   HLD (hyperlipidemia)   History of TIAs   Chronic diastolic CHF (congestive heart failure)   Hypokalemia   Anemia of chronic disease   Supratherapeutic INR   Rib fracture    Discharge Condition: medically stable for discharge to SNF today  Diet recommendation: as tolerated   History of present illness:  79 year old female with past medical history of atrial fibrillation, on coumadin, pacemaker, CAD with multiple cardiac stents, history of TIA's, dyslipidemia, from [facility] who presented to [facility] ED 05/04/2013 with.increasing productive cough,congestion and fever of 102 F. In ED, pt was hypoxic. CXR revealed cardiomegaly and interstitial opacity at bases, mild CHF, less likely pneumonia. Because of the fever and hypoxia pt was started on broad spectrum antibiotics for HCAP. Pt has respiratory cultures positive for pseudomonas sensitive to cipro. I spoke with infectious disease, Dr. Orvan Falconer and he recommended treating with total of 8 days (including what she has gotten in hospital).   Assessment/Plan:   Principal Problem:  HCAP (healthcare-associated pneumonia)   - Patient initially started on broad spectrum coverage, aztreonam and vancomycin, considering fever and hypoxia, treatment for HCAP. Patient was initially also on vancomycin but this was discontinued 05/07/2013  - One blood culture from 05/04/2013 is growing diphtheroid, corynebacterium ; the other blood culture from the same date shows no growth. Blood cultures were repeated on 05/08/2013 and those are showing no growth to date  - Sputum culture- growing Pseudomonas sensitive to Cipro  - I spoke with ID on call and recommendation was to trewat for pseudomonas for total of 8 days including what she has received in hospital. Corynebacterium is likely contaminant and does not require treatment per ID.  - continue bronchodilator treatments if needed only Active Problems:  Gram-positive bacteremia, diphtheroid, corynebacterium bacteremia  - one of the blood cultures drawn on 05/04/2013; likely contaminant  - Repeat blood cultures 05/08/2013 - show no growth to date HYPERTENSION  - reasonable control inpatient  CAD and stents placement, cardiac pacemaker  - troponins WNL since the admission  - nitroglycerin PRN for chest pain  - continue statin therapy  Paroxysmal atrial fibrillation  - on coumadin; as noted above may continue previous home regimen but with close monitoring of INR and to keep it in 3 range HLD (hyperlipidemia)  - continue statin therapy and omega 3 fatty acid  Hypokalemia  - on low daily dose potassium supplementation  - would try to keep potassium at 4  - potassium 4.1  Anemia of chronic disease  - likely related to chronic anticoagulation  - hemoglobin stable at 9.6  Acute on chronic diastolic CHF  - ProBNP 931 on this admission; last 2 D ECHO on file in  2013 with EF of 60%  - troponin negative x 3  - given Lasix iv x 1 dose 05/05/13  Moderate protein calorie malnutrition  - continue ensure supplements   Code Status: full code  Family Communication: no family at the  bedside   Consultants:  None  Procedures:  None  Antibiotics   Cipro 05/10/2013 --> until 05/12/2013 Aztreonam 05/04/2013 --> 05/10/2013 Levaquin 05/07/2013 --> 05/10/2013 Vanco 05/04/2013 --> 05/07/2013    Signed:  Manson Passey, MD  [facility] 05/11/2013, 1:04 PM  Pager #: [phone]    Discharge Exam: Filed Vitals:   05/11/13 0509  BP: 130/65  Pulse: 60  Temp: 97.7 F (36.5 C)  Resp: 16   Filed Vitals:   05/10/13 2118 05/11/13 0509 05/11/13 0749 05/11/13 1200  BP:  130/65    Pulse:  60    Temp:  97.7 F (36.5 C)    TempSrc:  Oral    Resp:  16    Height:      Weight:      SpO2: 98% 100% 96% 95%    General: Pt is alert, follows commands appropriately, not in acute distress Cardiovascular: Regular rate and rhythm, S1/S2 appreciated, ahs pacemaker Respiratory: Congested but in no respiratory distress Abdominal: Soft, non tender, non distended, bowel sounds +, no guarding Extremities: no edema, no cyanosis, pulses palpable bilaterally DP and PT Neuro: Grossly nonfocal  Discharge Instructions  Discharge Orders   Future Orders Complete By Expires   Call MD for:  difficulty breathing, headache or visual disturbances  As directed    Call MD for:  persistant dizziness or light-headedness  As directed    Call MD for:  persistant nausea and vomiting  As directed    Call MD for:  severe uncontrolled pain  As directed    Diet - low sodium heart healthy  As directed    Discharge instructions  As directed    Comments:     1. Please note that your sputum culture grew pseudomonas species which we are treating with ccipro 500 mg twice a day and you will take this for 1 more day in SNF. 2. Take coumadin as before, 2.5 mg tonight 05/12/2103 and please recheck INR pre nursing home protocol and adjust coumadin dose appropriately to reflect INR goal range 2-3. INR this morning 05/11/2013 is 2.14   Increase activity slowly  As directed        Medication List     STOP taking these medications       cephALEXin 125 MG/5ML suspension  Commonly known as:  KEFLEX      TAKE these medications       ADVAIR DISKUS 100-50 MCG/DOSE Aepb  Generic drug:  Fluticasone-Salmeterol  Inhale 1 puff into the lungs 2 (two) times daily. Rinse mouth after use.     atorvastatin 10 MG tablet  Commonly known as:  LIPITOR  Take 10 mg by mouth every evening.     bisacodyl 10 MG suppository  Commonly known as:  DULCOLAX  Place 1 suppository (10 mg total) rectally daily as needed for moderate constipation.     calcium carbonate 600 MG Tabs tablet  Commonly known as:  OS-CAL  Take 600 mg by mouth 2 (two) times daily.     ciprofloxacin 500 MG tablet  Commonly known as:  CIPRO  Take 1 tablet (500 mg total) by mouth 2 (two) times daily.     DSS 100 MG Caps  Take 100 mg by mouth 2 (  two) times daily.     feeding supplement (ENSURE COMPLETE) Liqd  Take 237 mLs by mouth 2 (two) times daily between meals.     Fish Oil 1000 MG Caps  Take 1,000 mg by mouth 2 (two) times daily.     furosemide 40 MG tablet  Commonly known as:  LASIX  Take 40 mg by mouth daily.     guaiFENesin 600 MG 12 hr tablet  Commonly known as:  MUCINEX  Take 600 mg by mouth 2 (two) times daily.     HYDROcodone-acetaminophen 5-325 MG per tablet  Commonly known as:  NORCO/VICODIN  Take 1-2 tablets by mouth 3 (three) times daily.     ICAPS AREDS FORMULA PO  Take 1 tablet by mouth 2 (two) times daily.     CERTAVITE/ANTIOXIDANTS Tabs  Take 1 tablet by mouth daily.     magnesium hydroxide 400 MG/5ML suspension  Commonly known as:  MILK OF MAGNESIA  Take 5 mLs by mouth daily as needed for mild constipation (may mix with prune juice for better effect).     metoprolol succinate 50 MG 24 hr tablet  Commonly known as:  TOPROL-XL  Take 1 tablet (50 mg total) by mouth daily. Take with or immediately following a meal.     mirtazapine 7.5 MG tablet  Commonly known as:  REMERON  Take 1 tablet  (7.5 mg total) by mouth at bedtime.     nitroGLYCERIN 0.4 MG SL tablet  Commonly known as:  NITROSTAT  Place 0.4 mg under the tongue every 5 (five) minutes as needed for chest pain (up to 3 doses).     nitroGLYCERIN 0.4 mg/hr patch  Commonly known as:  NITRODUR - Dosed in mg/24 hr  Place 1 patch onto the skin every evening. Nitrate free interval from 4pm - 8pm every day     pantoprazole 40 MG tablet  Commonly known as:  PROTONIX  Take 40 mg by mouth 2 (two) times daily.     polyethylene glycol packet  Commonly known as:  MIRALAX / GLYCOLAX  Take 17 g by mouth every other day.     potassium chloride 10 MEQ tablet  Commonly known as:  K-DUR,KLOR-CON  Take 10 mEq by mouth daily.     solifenacin 10 MG tablet  Commonly known as:  VESICARE  Take 10 mg by mouth every evening.     VENTOLIN HFA 108 (90 BASE) MCG/ACT inhaler  Generic drug:  albuterol  Inhale 2 puffs into the lungs every 4 (four) hours as needed for wheezing or shortness of breath. Wait 1 minute between each puff.     albuterol (5 MG/ML) 0.5% nebulizer solution  Commonly known as:  PROVENTIL  Take 0.5 mLs (2.5 mg total) by nebulization every 6 (six) hours as needed for wheezing or shortness of breath.     Vitamin B-12 1000 MCG Subl  Place 1 tablet under the tongue daily.     Vitamin D3 1000 UNITS Caps  Take 1 capsule by mouth 2 (two) times daily.     warfarin 2.5 MG tablet  Commonly known as:  COUMADIN  Take 2.5 mg by mouth See admin instructions. Take 2.5mg  (1 tablet) in the evening on Tuesday, Wednesday, Thursday, Friday, Saturday and Sunday (5 mg tablet on Monday)     warfarin 5 MG tablet  Commonly known as:  COUMADIN  Take 5 mg by mouth See admin instructions. Take 5mg  tablet in the evening on Monday and friday (2.5 mg tablet  on all other days)           Follow-up Information   Follow up with Londell Moh, MD. Schedule an appointment as soon as possible for a visit in 2 weeks.   Specialty:   Internal Medicine   Contact information:   [address] [place] [address] [phone]        The results of significant diagnostics from this hospitalization (including imaging, microbiology, ancillary and laboratory) are listed below for reference.    Significant Diagnostic Studies: Dg Chest 2 View  05/04/2013   CLINICAL DATA:  Fever.  Shortness of breath.  EXAM: CHEST  2 VIEW  COMPARISON:  10/10/2012.  FINDINGS: Tortuous thoracic aorta. The cardiopericardial silhouette is enlarged. Cardiac stent is noted. Dual lead left subclavian cardiac pacemaker. There is mild bilateral basilar airspace opacity which may represent interstitial pulmonary edema. Pneumonia is considered less likely. Small bilateral pleural effusions are present. Severe bilateral glenohumeral osteoarthritis. Aortic arch atherosclerosis with tortuosity of the aorta.  IMPRESSION: Cardiomegaly and interstitial opacity at the bases suggest mild CHF with interstitial pulmonary edema. Pneumonia considered less likely.   Electronically Signed   By: Andreas Newport M.D.   On: 05/04/2013 17:15   Dg Chest Port 1 View  05/11/2013   CLINICAL DATA:  Cough and congestion.  EXAM: PORTABLE CHEST - 1 VIEW  COMPARISON:  05/04/2013 and 10/10/2012  FINDINGS: Sequential pacemaker is in place entering from the left. Leads unchanged in position.  Mild cardiomegaly.  Central pulmonary vascular prominence. Chronic lung changes without definitive findings of segmental consolidation or frank pulmonary edema.  Calcified tortuous aorta.  No gross pneumothorax.  Bilateral shoulder joint degenerative changes.  IMPRESSION: Mild cardiomegaly with central pulmonary vascular prominence.  No segmental consolidation.  Calcified tortuous aorta.   Electronically Signed   By: Bridgett Larsson M.D.   On: 05/11/2013 09:57    Microbiology: Recent Results (from the past 240 hour(s))  CULTURE, BLOOD (ROUTINE X 2)     Status: None   Collection Time     05/04/13  4:03 PM      Result Value Range Status   Specimen Description BLOOD LEFT ANTECUBITAL   Final   Special Requests BOTTLES DRAWN AEROBIC AND ANAEROBIC 5CC   Final   Culture  Setup Time     Final   Value: 05/04/2013 22:32     Performed at [facility]   Culture     Final   Value: DIPHTHEROIDS(CORYNEBACTERIUM SPECIES)     Note: Standardized susceptibility testing for this organism is not available.     Note: Gram Stain Report Called to,Read Back By and Verified With: Waverly Ferrari RN 05/08/13 8:35AM BY MILSH     Performed at [facility]   Report Status 05/10/2013 FINAL   Final  CULTURE, BLOOD (ROUTINE X 2)     Status: None   Collection Time    05/04/13  4:37 PM      Result Value Range Status   Specimen Description BLOOD RIGHT WRIST   Final   Special Requests BOTTLES DRAWN AEROBIC AND ANAEROBIC 3CC   Final   Culture  Setup Time     Final   Value: 05/04/2013 22:33     Performed at [facility]   Culture     Final   Value: NO GROWTH 5 DAYS     Performed at [facility]   Report Status 05/10/2013 FINAL   Final  CULTURE, EXPECTORATED SPUTUM-ASSESSMENT  Status: None   Collection Time    05/04/13 10:38 PM      Result Value Range Status   Specimen Description SPUTUM   Final   Special Requests NONE   Final   Sputum evaluation     Final   Value: THIS SPECIMEN IS ACCEPTABLE. RESPIRATORY CULTURE REPORT TO FOLLOW.   Report Status 05/04/2013 FINAL   Final  CULTURE, RESPIRATORY (NON-EXPECTORATED)     Status: None   Collection Time    05/04/13 10:38 PM      Result Value Range Status   Specimen Description SPUTUM   Final   Special Requests NONE   Final   Gram Stain     Final   Value: ABUNDANT WBC PRESENT,BOTH PMN AND MONONUCLEAR     NO SQUAMOUS EPITHELIAL CELLS SEEN     FEW GRAM NEGATIVE RODS     Performed at [facility]   Culture     Final   Value: ABUNDANT PSEUDOMONAS AERUGINOSA     Performed at [facility]   Report Status  05/08/2013 FINAL   Final   Organism ID, Bacteria PSEUDOMONAS AERUGINOSA   Final  CULTURE, BLOOD (ROUTINE X 2)     Status: None   Collection Time    05/08/13 11:30 AM      Result Value Range Status   Specimen Description BLOOD RIGHT ARM   Final   Special Requests BOTTLES DRAWN AEROBIC AND ANAEROBIC 10CC   Final   Culture  Setup Time     Final   Value: 05/08/2013 14:26     Performed at [facility]   Culture     Final   Value:        BLOOD CULTURE RECEIVED NO GROWTH TO DATE CULTURE WILL BE HELD FOR 5 DAYS BEFORE ISSUING A FINAL NEGATIVE REPORT     Performed at [facility]   Report Status PENDING   Incomplete  CULTURE, BLOOD (ROUTINE X 2)     Status: None   Collection Time    05/08/13 11:40 AM      Result Value Range Status   Specimen Description BLOOD RIGHT HAND   Final   Special Requests BOTTLES DRAWN AEROBIC AND ANAEROBIC 5CC   Final   Culture  Setup Time     Final   Value: 05/08/2013 14:26     Performed at [facility]   Culture     Final   Value:        BLOOD CULTURE RECEIVED NO GROWTH TO DATE CULTURE WILL BE HELD FOR 5 DAYS BEFORE ISSUING A FINAL NEGATIVE REPORT     Performed at [facility]   Report Status PENDING   Incomplete     Labs: Basic Metabolic Panel:  Recent Labs Lab 05/04/13 1547 05/04/13 1949 05/05/13 0140 05/06/13 0430 05/07/13 0355 05/11/13 0406  NA 135  --  133* 137 137 139  K 3.8  --  3.8 3.3* 4.1 4.1  CL 98  --  98 103 105 107  CO2 24  --  24 26 24 26   GLUCOSE 138*  --  108* 104* 100* 97  BUN 19  --  19 14 13 11   CREATININE 0.75 0.71 0.68 0.57 0.54 0.54  CALCIUM 9.2  --  8.7 9.0 9.3 9.1  MG  --  2.1  --   --   --   --    Liver Function Tests:  Recent Labs Lab 05/05/13 0140  AST  28  ALT 20  ALKPHOS 75  BILITOT 0.6  PROT 6.0  ALBUMIN 2.8*   No results found for this basename: LIPASE, AMYLASE,  in the last 168 hours No results found for this basename: AMMONIA,  in the last 168 hours CBC:  Recent  Labs Lab 05/04/13 1547 05/04/13 1949 05/05/13 0251 05/06/13 0430 05/07/13 0355 05/11/13 0406  WBC 9.0 8.8 8.6 6.2 6.3 7.1  NEUTROABS 7.5  --  6.0  --   --   --   HGB 11.3* 11.0* 9.9* 9.9* 9.6* 9.3*  HCT 34.4* 34.2* 29.9* 30.7* 29.8* 29.0*  MCV 93.2 94.7 94.3 95.0 94.3 95.1  PLT 163 166 158 158 166 232   Cardiac Enzymes:  Recent Labs Lab 05/04/13 1547 05/04/13 1949 05/05/13 0140  TROPONINI <0.30 <0.30 <0.30   BNP: BNP (last 3 results)  Recent Labs  08/25/12 1652  PROBNP 930.8*   CBG: No results found for this basename: GLUCAP,  in the last 168 hours  Time coordinating discharge: Over 30 minutes

## 2013-05-12 LAB — POCT INR: INR: 2.5 — AB (ref 0.9–1.1)

## 2013-05-12 LAB — PROTIME-INR: Protime: 26.2 seconds — AB (ref 10.0–13.8)

## 2013-05-14 LAB — CULTURE, BLOOD (ROUTINE X 2)
Culture: NO GROWTH
Culture: NO GROWTH

## 2013-05-15 ENCOUNTER — Non-Acute Institutional Stay (SKILLED_NURSING_FACILITY): Payer: Medicare Other | Admitting: Nurse Practitioner

## 2013-05-15 ENCOUNTER — Encounter: Payer: Self-pay | Admitting: Nurse Practitioner

## 2013-05-15 DIAGNOSIS — I5032 Chronic diastolic (congestive) heart failure: Secondary | ICD-10-CM

## 2013-05-15 DIAGNOSIS — D638 Anemia in other chronic diseases classified elsewhere: Secondary | ICD-10-CM

## 2013-05-15 DIAGNOSIS — I509 Heart failure, unspecified: Secondary | ICD-10-CM

## 2013-05-15 DIAGNOSIS — I2510 Atherosclerotic heart disease of native coronary artery without angina pectoris: Secondary | ICD-10-CM

## 2013-05-15 DIAGNOSIS — I10 Essential (primary) hypertension: Secondary | ICD-10-CM

## 2013-05-15 DIAGNOSIS — K5900 Constipation, unspecified: Secondary | ICD-10-CM

## 2013-05-15 DIAGNOSIS — N318 Other neuromuscular dysfunction of bladder: Secondary | ICD-10-CM

## 2013-05-15 DIAGNOSIS — I4891 Unspecified atrial fibrillation: Secondary | ICD-10-CM

## 2013-05-15 DIAGNOSIS — E785 Hyperlipidemia, unspecified: Secondary | ICD-10-CM

## 2013-05-15 DIAGNOSIS — J189 Pneumonia, unspecified organism: Secondary | ICD-10-CM

## 2013-05-15 DIAGNOSIS — E876 Hypokalemia: Secondary | ICD-10-CM

## 2013-05-15 DIAGNOSIS — K219 Gastro-esophageal reflux disease without esophagitis: Secondary | ICD-10-CM

## 2013-05-15 NOTE — Assessment & Plan Note (Signed)
Stable on Protonix daily.

## 2013-05-15 NOTE — Assessment & Plan Note (Signed)
on low daily dose potassium supplementation, update BMP

## 2013-05-15 NOTE — Assessment & Plan Note (Signed)
troponins WNL while in hospital, nitroglycerin PRN for chest pain, continue statin therapy

## 2013-05-15 NOTE — Assessment & Plan Note (Signed)
Stable on MiraLax qod                 

## 2013-05-15 NOTE — Assessment & Plan Note (Signed)
Controlled. Metoprolol 50mg  daily.

## 2013-05-15 NOTE — Assessment & Plan Note (Signed)
Managed with Vesicare daily.

## 2013-05-15 NOTE — Progress Notes (Signed)
Patient ID: [name], female   DOB: [birth year], 80 y.o.   MRN: [id number]   Code Status: DNR  Allergies  Allergen Reactions  . Ampicillin Other (See Comments)    Reaction Unknown   . Ciprofloxin Hcl [Ciprofloxacin Hcl] Other (See Comments)    Reaction Unknown, has received Levaquin 12/14  . Demerol Other (See Comments)    Reaction Unknown   . Sulfa Antibiotics Rash    Chief Complaint  Patient presents with  . Medical Managment of Chronic Issues    A-fib  . Acute Visit    HPI: Patient is a 80 y.o. female seen in the SNF at [facility] today for evaluation of DOE and other chronic medical conditions. Hospitalized from 05/04/2013 to 05/11/2013 for SOB, PNA, CHF, generalized weakness. She has a long standing hx of atrial fibrillation, on coumadin, pacemaker, CAD with multiple cardiac stents, history of TIA's, dyslipidemia. CXR in ED  revealed cardiomegaly and interstitial opacity at bases, mild CHF, less likely pneumonia. Because of the fever and hypoxia pt was started on broad spectrum antibiotics for HCAP. Pt has respiratory cultures positive for pseudomonas sensitive to cipro. I spoke with infectious disease, Dr. Orvan Falconer and he recommended treating with total of 8 days (including what she has gotten in hospital). SOB at rest today Problem List Items Addressed This Visit   HYPERTENSION     Controlled. Metoprolol 50mg  daily.     CAD     troponins WNL while in hospital, nitroglycerin PRN for chest pain, continue statin therapy      Paroxysmal atrial fibrillation     Rate controlled.     HLD (hyperlipidemia)     continue statin therapy and omega 3 fatty acid      HCAP (healthcare-associated pneumonia) - Primary     sputum culture grew pseudomonas species which we are treating with cipro 500 mg twice a day and you will take this for 1 more day in SNF-completed.   Patient initially started on broad spectrum coverage, aztreonam and vancomycin, considering fever and  hypoxia, treatment for HCAP. Patient was initially also on vancomycin but this was discontinued 05/07/2013  One blood culture from 05/04/2013 is growing diphtheroid, corynebacterium ; the other blood culture from the same date shows no growth. Blood cultures were repeated on 05/08/2013 and those are showing no growth to date  Sputum culture- growing Pseudomonas sensitive to Cipro-completed x1 day after SNF admission. ID recommendation was to trewat for pseudomonas for total of 8 days including what she has received in hospital. Corynebacterium is likely contaminant and does not require treatment per ID. Continue bronchodilator treatments if needed only      Chronic diastolic CHF (congestive heart failure)     ProBNP 931 on this admission; last 2 D ECHO on file in 2013 with EF of 60%, troponin negative x 3, given Lasix iv x 1 dose 05/05/13, daily po Furosemide 40mg , update BMP and BNP     Hypokalemia     on low daily dose potassium supplementation, update BMP      Anemia of chronic disease     likely related to chronic anticoagulation, hemoglobin stable at 9.6, update CBC and anemia panel and Guaiac stoolx3     GERD (gastroesophageal reflux disease)     Stable on Protonix daily.     Unspecified constipation     Stable on MiraLax qod    Hypertonicity of bladder     Managed with Vesicare daily.  Review of Systems:  Review of Systems  Constitutional: Positive for malaise/fatigue. Negative for fever, chills, weight loss and diaphoresis.  HENT: Positive for hearing loss. Negative for congestion, ear discharge, ear pain, nosebleeds, sore throat and tinnitus.   Eyes: Negative for blurred vision, double vision, photophobia, pain, discharge and redness.  Respiratory: Positive for shortness of breath. Negative for cough, hemoptysis, sputum production, wheezing and stridor.   Cardiovascular: Positive for leg swelling and PND. Negative for chest pain, palpitations, orthopnea and  claudication.       Trace  Gastrointestinal: Negative for heartburn, nausea, vomiting, abdominal pain, diarrhea, constipation, blood in stool and melena.  Genitourinary: Positive for frequency. Negative for dysuria, urgency, hematuria and flank pain.  Musculoskeletal: Positive for back pain. Negative for falls, joint pain, myalgias and neck pain.  Skin: Negative for itching and rash.  Neurological: Positive for weakness. Negative for dizziness, tingling, tremors, sensory change, speech change, focal weakness, seizures, loss of consciousness and headaches.  Endo/Heme/Allergies: Negative for environmental allergies and polydipsia. Bruises/bleeds easily.  Psychiatric/Behavioral: Negative for depression, suicidal ideas, hallucinations, memory loss and substance abuse. The patient is not nervous/anxious and does not have insomnia.      Past Medical History  Diagnosis Date  . Transient ischemic attack (TIA) 1994  . Atrial fibrillation   . Hypertension   . Dyslipidemia   . Gastritis   . Vitamin B12 deficiency   . Peptic ulcer disease   . Osteopenia   . Macular degeneration, right eye   . Arthritis     back, shoulders  . Cough 05/21/2011    pt states cough x 2 days  . Edema     left leg  . Hypoglycemia   . Urinary incontinence   . CAD 06/15/2009  . Sinoatrial node dysfunction 06/15/2009    Dual-chamber PM inserted 03/18/09 St[employer] Jude Zephyr XL DR dual-chamber, seriel [id number].  . Tachy-brady syndrome     Dual-chamber PM inserted 03/18/09 St. Jude Zephyr XL DR dual-chamber, seriel [id number].  Marland Kitchen. CAD (coronary artery disease)     With LAD & RCA BMS, 2008 & LAD DES 2009 for restenosis. Both vessels patent by cath 03/18/09  . Atrial flutter   . Left heart failure   . Dyspnea     frequent  . Rectal bleeding     Aggravated by a combo of coumadin & aspirin. D/C'ed aspirin.  . Chronic diastolic congestive heart failure   . Asthma with COPD   . GERD (gastroesophageal reflux disease)   . Irritable  bowel   . Hip fracture, right   . Anorexia 08/27/12    Nearly 20 lbs weight loss over past year. Anorexia weight loss or major problems now & likely represent a slow progression/worsening of the terminal illness, perhaps GI. Probably time to start considering a change in care plan from aggresive/diagnostic to palliative (Dr. Verdis PrimeHenry Smith 08/17/12)  . Coronary atherosclerosis of native coronary artery   . Chest discomfort 08/27/12    Nearly continuous.  . Poor appetite    Past Surgical History  Procedure Laterality Date  . Perforated ulcer    . Eye surgery      bilateral  . Pacemaker insertion  03/18/09    For symptomatic brady-tachy syndrome. St[employer] Jude Zephyr XL DR dual-chamber, seriel [id number]  . Hip surgery      broke hip and has pin in it  . Drug-eluting stent  [id number]    LAD & RCA BMS in 2008 & LAD DES in 2009 for restenosis.  Both vessels patent by cath 03/18/09.  . Cardiac catheterization  03/18/09    With LAD & RCA BMS in 2008 & LAD DES in 2009 for restenosis.  . Varicose vein surgery    . Orif hip fracture Right     Pin in hip  . Cataract extraction Bilateral    Social History:   reports that she quit smoking about 17 years ago. She has never used smokeless tobacco. She reports that she does not drink alcohol or use illicit drugs.  Family History  Problem Relation Age of Onset  . CVA Mother   . Brain cancer Sister   . Heart attack Father   . Lymphoma Son   . CAD Son   . Testicular cancer Brother   . Alzheimer's disease Brother     Medications: Patient's Medications  New Prescriptions   No medications on file  Previous Medications   ALBUTEROL (PROVENTIL) (5 MG/ML) 0.5% NEBULIZER SOLUTION    Take 0.5 mLs (2.5 mg total) by nebulization every 6 (six) hours as needed for wheezing or shortness of breath.   ALBUTEROL (VENTOLIN HFA) 108 (90 BASE) MCG/ACT INHALER    Inhale 2 puffs into the lungs every 4 (four) hours as needed for wheezing or shortness of breath. Wait 1 minute  between each puff.   ATORVASTATIN (LIPITOR) 10 MG TABLET    Take 10 mg by mouth every evening.    BISACODYL (DULCOLAX) 10 MG SUPPOSITORY    Place 1 suppository (10 mg total) rectally daily as needed for moderate constipation.   CALCIUM CARBONATE (OS-CAL) 600 MG TABS    Take 600 mg by mouth 2 (two) times daily.     CEPHALEXIN (KEFLEX) 125 MG/5ML SUSPENSION    Take 5 mLs by mouth daily. For UTI Suppression   CHOLECALCIFEROL (VITAMIN D3) 1000 UNITS CAPS    Take 1 capsule by mouth 2 (two) times daily.    CIPROFLOXACIN (CIPRO) 500 MG TABLET    Take 1 tablet (500 mg total) by mouth 2 (two) times daily.   CYANOCOBALAMIN (VITAMIN B-12) 1000 MCG SUBL    Place 1 tablet under the tongue daily.   DOCUSATE SODIUM 100 MG CAPS    Take 100 mg by mouth 2 (two) times daily.   FEEDING SUPPLEMENT, ENSURE COMPLETE, (ENSURE COMPLETE) LIQD    Take 237 mLs by mouth 2 (two) times daily between meals.   FLUTICASONE-SALMETEROL (ADVAIR DISKUS) 100-50 MCG/DOSE AEPB    Inhale 1 puff into the lungs 2 (two) times daily. Rinse mouth after use.   FUROSEMIDE (LASIX) 40 MG TABLET    Take 40 mg by mouth daily.   GUAIFENESIN (MUCINEX) 600 MG 12 HR TABLET    Take 600 mg by mouth 2 (two) times daily.   HYDROCODONE-ACETAMINOPHEN (NORCO/VICODIN) 5-325 MG PER TABLET    Take 1-2 tablets by mouth 3 (three) times daily.   MAGNESIUM HYDROXIDE (MILK OF MAGNESIA) 400 MG/5ML SUSPENSION    Take 5 mLs by mouth daily as needed for mild constipation (may mix with prune juice for better effect).   METOPROLOL SUCCINATE (TOPROL-XL) 50 MG 24 HR TABLET    Take 1 tablet (50 mg total) by mouth daily. Take with or immediately following a meal.   MIRTAZAPINE (REMERON) 7.5 MG TABLET    Take 1 tablet (7.5 mg total) by mouth at bedtime.   MULTIPLE VITAMINS-MINERALS (CERTAVITE/ANTIOXIDANTS) TABS    Take 1 tablet by mouth daily.   MULTIPLE VITAMINS-MINERALS (ICAPS AREDS FORMULA PO)    Take 1  tablet by mouth 2 (two) times daily.     NITROGLYCERIN (NITRODUR - DOSED  IN MG/24 HR) 0.4 MG/HR    Place 1 patch onto the skin every evening. Nitrate free interval from 4pm - 8pm every day   NITROGLYCERIN (NITROSTAT) 0.4 MG SL TABLET    Place 0.4 mg under the tongue every 5 (five) minutes as needed for chest pain (up to 3 doses).    OMEGA-3 FATTY ACIDS (FISH OIL) 1000 MG CAPS    Take 1,000 mg by mouth 2 (two) times daily.   PANTOPRAZOLE (PROTONIX) 40 MG TABLET    Take 40 mg by mouth 2 (two) times daily.   POLYETHYLENE GLYCOL (MIRALAX / GLYCOLAX) PACKET    Take 17 g by mouth every other day.    POTASSIUM CHLORIDE (K-DUR,KLOR-CON) 10 MEQ TABLET    Take 10 mEq by mouth daily.   SOLIFENACIN (VESICARE) 10 MG TABLET    Take 10 mg by mouth every evening.   WARFARIN (COUMADIN) 2.5 MG TABLET    Take 2.5 mg by mouth See admin instructions. Take 2.5mg  (1 tablet) in the evening on Tuesday, Wednesday, Thursday, Friday, Saturday and Sunday (5 mg tablet on Monday)   WARFARIN (COUMADIN) 5 MG TABLET    Take 5 mg by mouth See admin instructions. Take 5mg  tablet in the evening on Monday and friday (2.5 mg tablet on all other days)  Modified Medications   No medications on file  Discontinued Medications   No medications on file     Physical Exam: Physical Exam  Constitutional: She is oriented to person, place, and time. She appears well-developed and well-nourished. No distress.  HENT:  Head: Normocephalic and atraumatic.  Right Ear: External ear normal.  Left Ear: External ear normal.  Mouth/Throat: Oropharynx is clear and moist. No oropharyngeal exudate.  Eyes: Conjunctivae and EOM are normal. Pupils are equal, round, and reactive to light. Right eye exhibits no discharge. Left eye exhibits no discharge. No scleral icterus.  Neck: Normal range of motion. Neck supple. No JVD present. No tracheal deviation present. No thyromegaly present.  Cardiovascular: Normal rate.   Murmur heard. Pulmonary/Chest: No stridor. No respiratory distress. She has no wheezes. She has no rales. She  exhibits no tenderness.  Abdominal: She exhibits no distension. There is no tenderness. There is no rebound and no guarding.  Musculoskeletal: Normal range of motion. She exhibits edema and tenderness.  Trace edema. Chronic back pain. Ambulates with walker.   Lymphadenopathy:    She has no cervical adenopathy.  Neurological: She is alert and oriented to person, place, and time. She has normal reflexes. She displays normal reflexes. No cranial nerve deficit. She exhibits normal muscle tone. Coordination normal.  Skin: Skin is warm and dry. No rash noted. She is not diaphoretic. No erythema. There is pallor.  Psychiatric: She has a normal mood and affect. Her behavior is normal. Judgment and thought content normal.    Filed Vitals:   05/15/13 1047  BP: 114/60  Pulse: 60  Temp: 98.8 F (37.1 C)  TempSrc: Tympanic  Resp: 22      Labs reviewed: Basic Metabolic Panel:  Recent Labs  16/10/96 1547 05/04/13 1949  05/06/13 0430 05/07/13 0355 05/11/13 0406  NA 135  --   < > 137 137 139  K 3.8  --   < > 3.3* 4.1 4.1  CL 98  --   < > 103 105 107  CO2 24  --   < > 26 24  26  GLUCOSE 138*  --   < > 104* 100* 97  BUN 19  --   < > 14 13 11   CREATININE 0.75 0.71  < > 0.57 0.54 0.54  CALCIUM 9.2  --   < > 9.0 9.3 9.1  MG  --  2.1  --   --   --   --   TSH  --  1.141  --   --   --   --   < > = values in this interval not displayed. Liver Function Tests:  Recent Labs  08/25/12 1651 05/05/13 0140  AST 29 28  ALT 20 20  ALKPHOS 68 75  BILITOT 0.4 0.6  PROT 6.8 6.0  ALBUMIN 3.3* 2.8*    Recent Labs  08/25/12 1651  LIPASE 13   CBC:  Recent Labs  08/25/12 1651 05/04/13 1547  05/05/13 0251 05/06/13 0430 05/07/13 0355 05/11/13 0406  WBC 7.2 9.0  < > 8.6 6.2 6.3 7.1  NEUTROABS 4.6 7.5  --  6.0  --   --   --   HGB 12.0 11.3*  < > 9.9* 9.9* 9.6* 9.3*  HCT 36.6 34.4*  < > 29.9* 30.7* 29.8* 29.0*  MCV 91.3 93.2  < > 94.3 95.0 94.3 95.1  PLT 212 163  < > 158 158 166 232  <  > = values in this interval not displayed. Lipid Panel:  Recent Labs  05/04/13 1949  CHOL 94  HDL 55  LDLCALC 28  TRIG 53  CHOLHDL 1.7    Past Procedures:  05/04/2013 CLINICAL DATA: Fever. Shortness of breath. EXAM: CHEST 2 VIEW IMPRESSION: Cardiomegaly and interstitial opacity at the bases suggest mild CHF with interstitial pulmonary edema. Pneumonia considered less likely.   05/11/2013 CLINICAL DATA: Cough and congestion. EXAM: PORTABLE CHEST - 1 VIEW IMPRESSION: Mild cardiomegaly with central pulmonary vascular prominence. No segmental consolidation. Calcified tortuous aorta.  Assessment/Plan HCAP (healthcare-associated pneumonia) sputum culture grew pseudomonas species which we are treating with cipro 500 mg twice a day and you will take this for 1 more day in SNF-completed.   Patient initially started on broad spectrum coverage, aztreonam and vancomycin, considering fever and hypoxia, treatment for HCAP. Patient was initially also on vancomycin but this was discontinued 05/07/2013  One blood culture from 05/04/2013 is growing diphtheroid, corynebacterium ; the other blood culture from the same date shows no growth. Blood cultures were repeated on 05/08/2013 and those are showing no growth to date  Sputum culture- growing Pseudomonas sensitive to Cipro-completed x1 day after SNF admission. ID recommendation was to trewat for pseudomonas for total of 8 days including what she has received in hospital. Corynebacterium is likely contaminant and does not require treatment per ID. Continue bronchodilator treatments if needed only    HYPERTENSION Controlled. Metoprolol 50mg  daily.   Paroxysmal atrial fibrillation Rate controlled.   CAD troponins WNL while in hospital, nitroglycerin PRN for chest pain, continue statin therapy    HLD (hyperlipidemia) continue statin therapy and omega 3 fatty acid    Hypokalemia on low daily dose potassium supplementation, update  BMP    Anemia of chronic disease likely related to chronic anticoagulation, hemoglobin stable at 9.6, update CBC and anemia panel and Guaiac stoolx3   Chronic diastolic CHF (congestive heart failure) ProBNP 931 on this admission; last 2 D ECHO on file in 2013 with EF of 60%, troponin negative x 3, given Lasix iv x 1 dose 05/05/13, daily po Furosemide 40mg , update BMP and  BNP   GERD (gastroesophageal reflux disease) Stable on Protonix daily.   Unspecified constipation Stable on MiraLax qod  Hypertonicity of bladder Managed with Vesicare daily.     Family/ Staff Communication: observe the patient.   Goals of Care: SNF for strengthening an goal is to return to [place]  Labs/tests ordered: CBC, anemia panel, Guaiac stool x3, BMP, BNP, TSH.

## 2013-05-15 NOTE — Assessment & Plan Note (Signed)
ProBNP 931 on this admission; last 2 D ECHO on file in 2013 with EF of 60%, troponin negative x 3, given Lasix iv x 1 dose 05/05/13, daily po Furosemide 40mg , update BMP and BNP

## 2013-05-15 NOTE — Assessment & Plan Note (Signed)
Rate controlled 

## 2013-05-15 NOTE — Assessment & Plan Note (Signed)
likely related to chronic anticoagulation, hemoglobin stable at 9.6, update CBC and anemia panel and Guaiac stoolx3

## 2013-05-15 NOTE — Assessment & Plan Note (Addendum)
sputum culture grew pseudomonas species which we are treating with cipro 500 mg twice a day and you will take this for 1 more day in SNF-completed.   Patient initially started on broad spectrum coverage, aztreonam and vancomycin, considering fever and hypoxia, treatment for HCAP. Patient was initially also on vancomycin but this was discontinued 05/07/2013  One blood culture from 05/04/2013 is growing diphtheroid, corynebacterium ; the other blood culture from the same date shows no growth. Blood cultures were repeated on 05/08/2013 and those are showing no growth to date  Sputum culture- growing Pseudomonas sensitive to Cipro-completed x1 day after SNF admission. ID recommendation was to trewat for pseudomonas for total of 8 days including what she has received in hospital. Corynebacterium is likely contaminant and does not require treatment per ID. Continue bronchodilator treatments if needed only

## 2013-05-15 NOTE — Assessment & Plan Note (Signed)
continue statin therapy and omega 3 fatty acid

## 2013-09-13 ENCOUNTER — Telehealth: Payer: Self-pay | Admitting: Physician Assistant

## 2013-09-13 ENCOUNTER — Emergency Department (HOSPITAL_COMMUNITY): Payer: Medicare Other

## 2013-09-13 ENCOUNTER — Encounter (HOSPITAL_COMMUNITY): Payer: Self-pay | Admitting: Emergency Medicine

## 2013-09-13 ENCOUNTER — Emergency Department (HOSPITAL_COMMUNITY)
Admission: EM | Admit: 2013-09-13 | Discharge: 2013-09-13 | Disposition: A | Discharge status: Home / Self Care | Payer: Medicare Other | Attending: Emergency Medicine | Admitting: Emergency Medicine

## 2013-09-13 DIAGNOSIS — Z8781 Personal history of (healed) traumatic fracture: Secondary | ICD-10-CM | POA: Insufficient documentation

## 2013-09-13 DIAGNOSIS — I495 Sick sinus syndrome: Secondary | ICD-10-CM | POA: Insufficient documentation

## 2013-09-13 DIAGNOSIS — Z9889 Other specified postprocedural states: Secondary | ICD-10-CM | POA: Insufficient documentation

## 2013-09-13 DIAGNOSIS — I5032 Chronic diastolic (congestive) heart failure: Secondary | ICD-10-CM | POA: Insufficient documentation

## 2013-09-13 DIAGNOSIS — M949 Disorder of cartilage, unspecified: Secondary | ICD-10-CM

## 2013-09-13 DIAGNOSIS — J45901 Unspecified asthma with (acute) exacerbation: Secondary | ICD-10-CM

## 2013-09-13 DIAGNOSIS — I2510 Atherosclerotic heart disease of native coronary artery without angina pectoris: Secondary | ICD-10-CM | POA: Insufficient documentation

## 2013-09-13 DIAGNOSIS — M19019 Primary osteoarthritis, unspecified shoulder: Secondary | ICD-10-CM | POA: Insufficient documentation

## 2013-09-13 DIAGNOSIS — Z8711 Personal history of peptic ulcer disease: Secondary | ICD-10-CM | POA: Insufficient documentation

## 2013-09-13 DIAGNOSIS — R0600 Dyspnea, unspecified: Secondary | ICD-10-CM

## 2013-09-13 DIAGNOSIS — IMO0002 Reserved for concepts with insufficient information to code with codable children: Secondary | ICD-10-CM | POA: Insufficient documentation

## 2013-09-13 DIAGNOSIS — E785 Hyperlipidemia, unspecified: Secondary | ICD-10-CM | POA: Insufficient documentation

## 2013-09-13 DIAGNOSIS — Z87448 Personal history of other diseases of urinary system: Secondary | ICD-10-CM | POA: Insufficient documentation

## 2013-09-13 DIAGNOSIS — Z87891 Personal history of nicotine dependence: Secondary | ICD-10-CM | POA: Insufficient documentation

## 2013-09-13 DIAGNOSIS — M899 Disorder of bone, unspecified: Secondary | ICD-10-CM | POA: Insufficient documentation

## 2013-09-13 DIAGNOSIS — Z7901 Long term (current) use of anticoagulants: Secondary | ICD-10-CM | POA: Insufficient documentation

## 2013-09-13 DIAGNOSIS — I10 Essential (primary) hypertension: Secondary | ICD-10-CM | POA: Insufficient documentation

## 2013-09-13 DIAGNOSIS — Z8669 Personal history of other diseases of the nervous system and sense organs: Secondary | ICD-10-CM | POA: Insufficient documentation

## 2013-09-13 DIAGNOSIS — R079 Chest pain, unspecified: Secondary | ICD-10-CM

## 2013-09-13 DIAGNOSIS — I4891 Unspecified atrial fibrillation: Secondary | ICD-10-CM | POA: Insufficient documentation

## 2013-09-13 DIAGNOSIS — K219 Gastro-esophageal reflux disease without esophagitis: Secondary | ICD-10-CM | POA: Insufficient documentation

## 2013-09-13 DIAGNOSIS — Z8639 Personal history of other endocrine, nutritional and metabolic disease: Secondary | ICD-10-CM | POA: Insufficient documentation

## 2013-09-13 DIAGNOSIS — Z950 Presence of cardiac pacemaker: Secondary | ICD-10-CM | POA: Insufficient documentation

## 2013-09-13 DIAGNOSIS — M479 Spondylosis, unspecified: Secondary | ICD-10-CM | POA: Insufficient documentation

## 2013-09-13 DIAGNOSIS — J441 Chronic obstructive pulmonary disease with (acute) exacerbation: Secondary | ICD-10-CM | POA: Insufficient documentation

## 2013-09-13 DIAGNOSIS — Z8673 Personal history of transient ischemic attack (TIA), and cerebral infarction without residual deficits: Secondary | ICD-10-CM | POA: Insufficient documentation

## 2013-09-13 DIAGNOSIS — I4892 Unspecified atrial flutter: Secondary | ICD-10-CM | POA: Insufficient documentation

## 2013-09-13 DIAGNOSIS — Z79899 Other long term (current) drug therapy: Secondary | ICD-10-CM | POA: Insufficient documentation

## 2013-09-13 LAB — CBC
HCT: 36.4 % (ref 36.0–46.0)
HEMOGLOBIN: 11.9 g/dL — AB (ref 12.0–15.0)
MCH: 31.4 pg (ref 26.0–34.0)
MCHC: 32.7 g/dL (ref 30.0–36.0)
MCV: 96 fL (ref 78.0–100.0)
Platelets: 195 10*3/uL (ref 150–400)
RBC: 3.79 MIL/uL — ABNORMAL LOW (ref 3.87–5.11)
RDW: 12.9 % (ref 11.5–15.5)
WBC: 8.2 10*3/uL (ref 4.0–10.5)

## 2013-09-13 LAB — URINALYSIS, ROUTINE W REFLEX MICROSCOPIC
Bilirubin Urine: NEGATIVE
Glucose, UA: NEGATIVE mg/dL
Hgb urine dipstick: NEGATIVE
Ketones, ur: NEGATIVE mg/dL
Leukocytes, UA: NEGATIVE
Nitrite: NEGATIVE
Protein, ur: NEGATIVE mg/dL
Specific Gravity, Urine: 1.011 (ref 1.005–1.030)
UROBILINOGEN UA: 0.2 mg/dL (ref 0.0–1.0)
pH: 6 (ref 5.0–8.0)

## 2013-09-13 LAB — COMPREHENSIVE METABOLIC PANEL
ALK PHOS: 72 U/L (ref 39–117)
ALT: 23 U/L (ref 0–35)
AST: 26 U/L (ref 0–37)
Albumin: 3.3 g/dL — ABNORMAL LOW (ref 3.5–5.2)
BUN: 22 mg/dL (ref 6–23)
CHLORIDE: 103 meq/L (ref 96–112)
CO2: 24 mEq/L (ref 19–32)
Calcium: 9 mg/dL (ref 8.4–10.5)
Creatinine, Ser: 0.71 mg/dL (ref 0.50–1.10)
GFR, EST AFRICAN AMERICAN: 82 mL/min — AB (ref >90)
GFR, EST NON AFRICAN AMERICAN: 71 mL/min — AB (ref >90)
GLUCOSE: 78 mg/dL (ref 70–99)
POTASSIUM: 4 meq/L (ref 3.7–5.3)
Sodium: 143 mEq/L (ref 137–147)
Total Bilirubin: 0.3 mg/dL (ref 0.3–1.2)
Total Protein: 6.8 g/dL (ref 6.0–8.3)

## 2013-09-13 LAB — TROPONIN I: Troponin I: <0.3 ng/mL (ref <0.30)

## 2013-09-13 LAB — PROTIME-INR
INR: 2.02 — ABNORMAL HIGH (ref 0.00–1.49)
PROTHROMBIN TIME: 22.2 s — AB (ref 11.6–15.2)

## 2013-09-13 LAB — APTT: APTT: 41 s — AB (ref 24–37)

## 2013-09-13 MED ORDER — SODIUM CHLORIDE 0.9 % IV SOLN: 20.0000 mL | INTRAVENOUS | Status: DC

## 2013-09-13 MED ORDER — ASPIRIN 81 MG PO CHEW: 324.0000 mg | CHEWABLE_TABLET | Freq: Once | ORAL | Status: DC

## 2013-09-13 MED ORDER — NITROGLYCERIN 0.4 MG SL SUBL: 0.4000 mg | SUBLINGUAL_TABLET | SUBLINGUAL | Status: DC | PRN

## 2013-09-13 NOTE — ED Notes (Signed)
Called lab to add on Troponin 

## 2013-09-13 NOTE — Telephone Encounter (Signed)
Pt complaining of chest pain and SOB. She is reluctant to be transported by EMS to the ER unless cardiology recommends this. I recommended this. Jacki ConesLaurie with the facility will advise her and encourage her to allow them to call 911.

## 2013-09-13 NOTE — ED Provider Notes (Signed)
CSN: [id number]     Arrival date & time 09/13/13  1346 History   First MD Initiated Contact with Patient 09/13/13 1355     Chief Complaint  Patient presents with  . Chest Pain     (Consider location/radiation/quality/duration/timing/severity/associated sxs/prior Treatment) HPI 89 rolled female from assisted living care facility comes in today complaining that she began feeling short of breath after breakfast this morning. She describes some dull heaviness in her anterior chest. He says she has some cough all the time and has had some change in the sputum color to tan with some red streaks. However, this was described several months ago in her primary care physician's note. She denies any fever or chills. She has been taking by mouth without difficulty. She is unable to tell me she had a similar symptoms like this in the past or not. Past Medical History  Diagnosis Date  . Transient ischemic attack (TIA) 1994  . Atrial fibrillation   . Hypertension   . Dyslipidemia   . Gastritis   . Vitamin B12 deficiency   . Peptic ulcer disease   . Osteopenia   . Macular degeneration, right eye   . Arthritis     back, shoulders  . Cough 05/21/2011    pt states cough x 2 days  . Edema     left leg  . Hypoglycemia   . Urinary incontinence   . CAD 06/15/2009  . Sinoatrial node dysfunction 06/15/2009    Dual-chamber PM inserted 03/18/09 St. Jude Zephyr XL DR dual-chamber, seriel [id number].  . Tachy-brady syndrome     Dual-chamber PM inserted 03/18/09 St. Jude Zephyr XL DR dual-chamber, seriel [id number].  Marland Kitchen CAD (coronary artery disease)     With LAD & RCA BMS, 2008 & LAD DES 2009 for restenosis. Both vessels patent by cath 03/18/09  . Atrial flutter   . Left heart failure   . Dyspnea     frequent  . Rectal bleeding     Aggravated by a combo of coumadin & aspirin. D/C'ed aspirin.  . Chronic diastolic congestive heart failure   . Asthma with COPD   . GERD (gastroesophageal reflux disease)   . Irritable  bowel   . Hip fracture, right   . Anorexia 08/27/12    Nearly 20 lbs weight loss over past year. Anorexia weight loss or major problems now & likely represent a slow progression/worsening of the terminal illness, perhaps GI. Probably time to start considering a change in care plan from aggresive/diagnostic to palliative (Dr. Verdis Prime 08/17/12)  . Coronary atherosclerosis of native coronary artery   . Chest discomfort 08/27/12    Nearly continuous.  . Poor appetite    Past Surgical History  Procedure Laterality Date  . Perforated ulcer    . Eye surgery      bilateral  . Pacemaker insertion  03/18/09    For symptomatic brady-tachy syndrome. St. Jude Zephyr XL DR dual-chamber, seriel [id number]  . Hip surgery      broke hip and has pin in it  . Drug-eluting stent  1191,4782    LAD & RCA BMS in 2008 & LAD DES in 2009 for restenosis. Both vessels patent by cath 03/18/09.  . Cardiac catheterization  03/18/09    With LAD & RCA BMS in 2008 & LAD DES in 2009 for restenosis.  . Varicose vein surgery    . Orif hip fracture Right     Pin in hip  . Cataract extraction Bilateral  Family History  Problem Relation Age of Onset  . CVA Mother   . Brain cancer Sister   . Heart attack Father   . Lymphoma Son   . CAD Son   . Testicular cancer Brother   . Alzheimer's disease Brother    History  Substance Use Topics  . Smoking status: Former Smoker    Quit date: 03/14/1996  . Smokeless tobacco: Never Used  . Alcohol Use: No   OB History   Grav Para Term Preterm Abortions TAB SAB Ect Mult Living                 Review of Systems  All other systems reviewed and are negative.     Allergies  Ampicillin; Ciprofloxin hcl; Demerol; and Sulfa antibiotics  Home Medications   Prior to Admission medications   Medication Sig Start Date End Date Taking? Authorizing Provider  atorvastatin (LIPITOR) 10 MG tablet Take 10 mg by mouth every evening.    Yes Historical Provider, MD  buprenorphine  (BUTRANS) 5 MCG/HR PTWK patch Place 5 mcg onto the skin once a week. Apply new patch every Monday   Yes Historical Provider, MD  calcium carbonate (OS-CAL) 600 MG TABS Take 600 mg by mouth 2 (two) times daily.     Yes Historical Provider, MD  Cholecalciferol (VITAMIN D3) 1000 UNITS CAPS Take 1 capsule by mouth 2 (two) times daily.    Yes Historical Provider, MD  Cyanocobalamin (VITAMIN B-12) 1000 MCG SUBL Place 1 tablet under the tongue daily.   Yes Historical Provider, MD  docusate sodium 100 MG CAPS Take 100 mg by mouth 2 (two) times daily. 05/11/13  Yes Alison Murray, MD  Fluticasone-Salmeterol (ADVAIR DISKUS) 100-50 MCG/DOSE AEPB Inhale 1 puff into the lungs 2 (two) times daily. Rinse mouth after use.   Yes Historical Provider, MD  furosemide (LASIX) 40 MG tablet Take 40 mg by mouth daily.   Yes Historical Provider, MD  guaiFENesin (MUCINEX) 600 MG 12 hr tablet Take 600 mg by mouth 2 (two) times daily.   Yes Historical Provider, MD  HYDROcodone-acetaminophen (NORCO/VICODIN) 5-325 MG per tablet Take 1-2 tablets by mouth 3 (three) times daily. 05/11/13  Yes Alison Murray, MD  lactose free nutrition (BOOST) LIQD Take 237 mLs by mouth daily.   Yes Historical Provider, MD  metoprolol succinate (TOPROL-XL) 50 MG 24 hr tablet Take 1 tablet (50 mg total) by mouth daily. Take with or immediately following a meal. 04/29/13  Yes Hillis Range, MD  mirtazapine (REMERON) 7.5 MG tablet Take 1 tablet (7.5 mg total) by mouth at bedtime. 05/11/13  Yes Alison Murray, MD  Multiple Vitamins-Minerals (CERTAVITE/ANTIOXIDANTS) TABS Take 1 tablet by mouth daily.   Yes Historical Provider, MD  Multiple Vitamins-Minerals (ICAPS AREDS FORMULA PO) Take 1 tablet by mouth 2 (two) times daily.     Yes Historical Provider, MD  nitroGLYCERIN (NITRODUR - DOSED IN MG/24 HR) 0.4 mg/hr Place 1 patch onto the skin every evening. Nitrate free interval from 4pm - 8pm every day   Yes Historical Provider, MD  Omega-3 Fatty Acids (FISH OIL)  1000 MG CAPS Take 1,000 mg by mouth 2 (two) times daily.   Yes Historical Provider, MD  oxybutynin (DITROPAN-XL) 10 MG 24 hr tablet Take 10 mg by mouth at bedtime.   Yes Historical Provider, MD  pantoprazole (PROTONIX) 40 MG tablet Take 40 mg by mouth 2 (two) times daily.   Yes Historical Provider, MD  polyethylene glycol (MIRALAX / GLYCOLAX)  packet Take 17 g by mouth every other day.    Yes Historical Provider, MD  potassium chloride (K-DUR,KLOR-CON) 10 MEQ tablet Take 10 mEq by mouth daily.   Yes Historical Provider, MD  warfarin (COUMADIN) 7.5 MG tablet Take 3.75 mg by mouth daily at 6 PM.   Yes Historical Provider, MD  albuterol (PROVENTIL) (5 MG/ML) 0.5% nebulizer solution Take 0.5 mLs (2.5 mg total) by nebulization every 6 (six) hours as needed for wheezing or shortness of breath. 05/11/13   Alison MurrayAlma M Devine, MD  albuterol (VENTOLIN HFA) 108 (90 BASE) MCG/ACT inhaler Inhale 2 puffs into the lungs every 4 (four) hours as needed for wheezing or shortness of breath. Wait 1 minute between each puff.    Historical Provider, MD  bisacodyl (DULCOLAX) 10 MG suppository Place 1 suppository (10 mg total) rectally daily as needed for moderate constipation. 05/11/13   Alison MurrayAlma M Devine, MD  magnesium hydroxide (MILK OF MAGNESIA) 400 MG/5ML suspension Take 5 mLs by mouth daily as needed for mild constipation (may mix with prune juice for better effect). 05/11/13   Alison MurrayAlma M Devine, MD  nitroGLYCERIN (NITROSTAT) 0.4 MG SL tablet Place 0.4 mg under the tongue every 5 (five) minutes as needed for chest pain (up to 3 doses).     Historical Provider, MD   BP 101/57  Pulse 60  Temp(Src) 98.3 F (36.8 C) (Oral)  Resp 19  SpO2 97% Physical Exam  Nursing note and vitals reviewed. Constitutional: She is oriented to person, place, and time. She appears well-developed and well-nourished.  HENT:  Head: Normocephalic and atraumatic.  Right Ear: External ear normal.  Left Ear: External ear normal.  Nose: Nose normal.   Mouth/Throat: Oropharynx is clear and moist.  Eyes: Conjunctivae and EOM are normal. Pupils are equal, round, and reactive to light.  Neck: Normal range of motion. Neck supple.  Cardiovascular: Normal rate, regular rhythm, normal heart sounds and intact distal pulses.   Pulmonary/Chest:  Crackles bilateral bases  Abdominal: Soft. Bowel sounds are normal.  Musculoskeletal: Normal range of motion.  Left lower extremity swelling with some erythema that is at baseline per patient  Neurological: She is alert and oriented to person, place, and time. She has normal reflexes. She displays normal reflexes. No cranial nerve deficit. She exhibits normal muscle tone. Coordination normal.  Skin: Skin is warm and dry.  Psychiatric: She has a normal mood and affect.    ED Course  Procedures (including critical care time) Labs Review Labs Reviewed  APTT - Abnormal; Notable for the following:    aPTT 41 (*)    All other components within normal limits  CBC - Abnormal; Notable for the following:    RBC 3.79 (*)    Hemoglobin 11.9 (*)    All other components within normal limits  COMPREHENSIVE METABOLIC PANEL - Abnormal; Notable for the following:    Albumin 3.3 (*)    GFR calc non Af Amer 71 (*)    GFR calc Af Amer 82 (*)    All other components within normal limits  PROTIME-INR - Abnormal; Notable for the following:    Prothrombin Time 22.2 (*)    INR 2.02 (*)    All other components within normal limits  TROPONIN I  URINALYSIS, ROUTINE W REFLEX MICROSCOPIC    Imaging Review Dg Chest Portable 1 View  09/13/2013   CLINICAL DATA:  Chest pain  EXAM: PORTABLE CHEST - 1 VIEW  COMPARISON:  05/11/2013  FINDINGS: Left chest wall pacer  device is identified with lead in the right atrial appendage and right ventricle. The heart size and mediastinal contours are within normal limits. Atherosclerotic disease involves the thoracic aorta and coronary arteries. Coarsened interstitial markings are noted. Both  lungs are clear. Osteoarthritis involves the glenohumeral joints.  IMPRESSION: 1. No acute findings. 2. Atherosclerosis. 3. Osteoarthritis.   Electronically Signed   By: Signa Kellaylor  Stroud M.D.   On: 09/13/2013 14:40     EKG Interpretation   Date/Time:  Sunday Sep 13 2013 14:01:56 EDT Ventricular Rate:  60 PR Interval:    QRS Duration: 102 QT Interval:  431 QTC Calculation: 431 R Axis:   31 Text Interpretation:  atrial paced rhythm Probable left ventricular  hypertrophy No significant change since 05/04/13 Confirmed by Roseline Ebarb MD,  Duwayne HeckANIELLE [phone]([id number]) on 09/13/2013 2:16:14 PM      MDM   Final diagnoses:  Dyspnea  Chest pain     Patient's pain resolved spontaneously here. I have discussed further workup, diagnostics, and treatment with her daughter. She is a DO NOT RESUSCITATE. Delta troponins are normal and patient feels at baseline. We will discharge her back to her assisted living care facility and advised her to return if further problems. Although this could represent pulmonary embolism , the patient is on anticoagulants and would not likely that if it from any further aggressive treatment. She is therapeutic on her Coumadin. Chest pain was evaluated with EKG which is unchanged from prior and delta troponins which are normal. Dyspnea was evaluated with a chest x-Roman Sandall and oxygen saturations. These do not appear show any acute abnormalities. Patient was weak but has now been up and ambulatory in the department and feels at her baseline per the patient and the family.    Hilario Quarryanielle S Madysin Crisp, MD 09/13/13 [phone]

## 2013-09-13 NOTE — ED Notes (Signed)
To room via EMS.  Onset 1115a pt started having pain underneath left rib cage, constant lasting several hours and shortness of breath.  Pt wanted to wait on calling EMS to see if pain subsided. EMS gave ASA 324 mg @ 1334, after 5 min pt did not c/o of any pain.  Pt states when she got back to her room this morning and sat down she just felt weak, short of breath and had the pressure pain underneath left rib cage.

## 2013-09-13 NOTE — Discharge Instructions (Signed)
Please followup with her doctor during the next one to 3 days. Return if any further problems.

## 2013-10-26 ENCOUNTER — Ambulatory Visit (INDEPENDENT_AMBULATORY_CARE_PROVIDER_SITE_OTHER): Payer: Medicare Other | Admitting: *Deleted

## 2013-10-26 DIAGNOSIS — I495 Sick sinus syndrome: Secondary | ICD-10-CM

## 2013-10-26 DIAGNOSIS — I4891 Unspecified atrial fibrillation: Secondary | ICD-10-CM

## 2013-10-26 LAB — MDC_IDC_ENUM_SESS_TYPE_INCLINIC
Brady Statistic RA Percent Paced: 78 %
Date Time Interrogation Session: 20150615183807
Implantable Pulse Generator Serial Number: 7048529
Lead Channel Impedance Value: 392 Ohm
Lead Channel Impedance Value: 476 Ohm
Lead Channel Pacing Threshold Pulse Width: 0.5 ms
Lead Channel Pacing Threshold Pulse Width: 0.5 ms
Lead Channel Sensing Intrinsic Amplitude: 1.1 mV
Lead Channel Sensing Intrinsic Amplitude: 12 mV
Lead Channel Setting Pacing Amplitude: 2 V
Lead Channel Setting Sensing Sensitivity: 2 mV
MDC IDC MSMT BATTERY IMPEDANCE: 1000 Ohm — AB
MDC IDC MSMT BATTERY VOLTAGE: 2.75 V
MDC IDC MSMT LEADCHNL RA PACING THRESHOLD AMPLITUDE: 0.75 V
MDC IDC MSMT LEADCHNL RV PACING THRESHOLD AMPLITUDE: 1 V
MDC IDC SET LEADCHNL RV PACING AMPLITUDE: 2.5 V
MDC IDC SET LEADCHNL RV PACING PULSEWIDTH: 0.5 ms
MDC IDC STAT BRADY RV PERCENT PACED: 4.1 %

## 2013-10-26 NOTE — Progress Notes (Signed)
Pacemaker check in clinic. Normal device function. Thresholds, sensing, impedances consistent with previous measurements. Device programmed to maximize longevity. 1,759 mode switches (6%)---max dur. 2 days, Max A 591 + Warfarin. No high ventricular rates noted. Device programmed at appropriate safety margins. Histogram distribution appropriate for patient activity level. Device programmed to optimize intrinsic conduction. Estimated longevity 7.25->10 years. Patient to follow up with JA in 6 months.

## 2013-11-18 ENCOUNTER — Encounter: Payer: Self-pay | Admitting: Internal Medicine

## 2013-11-19 ENCOUNTER — Emergency Department (HOSPITAL_COMMUNITY)
Admission: EM | Admit: 2013-11-19 | Discharge: 2013-11-19 | Disposition: A | Discharge status: Home / Self Care | Payer: Medicare Other | Attending: Emergency Medicine | Admitting: Emergency Medicine

## 2013-11-19 ENCOUNTER — Emergency Department (HOSPITAL_COMMUNITY): Payer: Medicare Other

## 2013-11-19 ENCOUNTER — Encounter (HOSPITAL_COMMUNITY): Payer: Self-pay | Admitting: Emergency Medicine

## 2013-11-19 DIAGNOSIS — J40 Bronchitis, not specified as acute or chronic: Secondary | ICD-10-CM

## 2013-11-19 DIAGNOSIS — Z7901 Long term (current) use of anticoagulants: Secondary | ICD-10-CM | POA: Insufficient documentation

## 2013-11-19 DIAGNOSIS — I5032 Chronic diastolic (congestive) heart failure: Secondary | ICD-10-CM | POA: Insufficient documentation

## 2013-11-19 DIAGNOSIS — I10 Essential (primary) hypertension: Secondary | ICD-10-CM | POA: Insufficient documentation

## 2013-11-19 DIAGNOSIS — Z8673 Personal history of transient ischemic attack (TIA), and cerebral infarction without residual deficits: Secondary | ICD-10-CM | POA: Insufficient documentation

## 2013-11-19 DIAGNOSIS — Z79899 Other long term (current) drug therapy: Secondary | ICD-10-CM | POA: Insufficient documentation

## 2013-11-19 DIAGNOSIS — E785 Hyperlipidemia, unspecified: Secondary | ICD-10-CM | POA: Insufficient documentation

## 2013-11-19 DIAGNOSIS — IMO0002 Reserved for concepts with insufficient information to code with codable children: Secondary | ICD-10-CM | POA: Insufficient documentation

## 2013-11-19 DIAGNOSIS — Z8669 Personal history of other diseases of the nervous system and sense organs: Secondary | ICD-10-CM | POA: Insufficient documentation

## 2013-11-19 DIAGNOSIS — Z9889 Other specified postprocedural states: Secondary | ICD-10-CM | POA: Insufficient documentation

## 2013-11-19 DIAGNOSIS — Z87891 Personal history of nicotine dependence: Secondary | ICD-10-CM | POA: Insufficient documentation

## 2013-11-19 DIAGNOSIS — I2510 Atherosclerotic heart disease of native coronary artery without angina pectoris: Secondary | ICD-10-CM | POA: Insufficient documentation

## 2013-11-19 DIAGNOSIS — Z9861 Coronary angioplasty status: Secondary | ICD-10-CM | POA: Insufficient documentation

## 2013-11-19 DIAGNOSIS — J209 Acute bronchitis, unspecified: Secondary | ICD-10-CM | POA: Insufficient documentation

## 2013-11-19 DIAGNOSIS — Z950 Presence of cardiac pacemaker: Secondary | ICD-10-CM | POA: Insufficient documentation

## 2013-11-19 DIAGNOSIS — K219 Gastro-esophageal reflux disease without esophagitis: Secondary | ICD-10-CM | POA: Insufficient documentation

## 2013-11-19 DIAGNOSIS — I4891 Unspecified atrial fibrillation: Secondary | ICD-10-CM | POA: Insufficient documentation

## 2013-11-19 DIAGNOSIS — J00 Acute nasopharyngitis [common cold]: Secondary | ICD-10-CM | POA: Insufficient documentation

## 2013-11-19 DIAGNOSIS — M129 Arthropathy, unspecified: Secondary | ICD-10-CM | POA: Insufficient documentation

## 2013-11-19 DIAGNOSIS — Z880 Allergy status to penicillin: Secondary | ICD-10-CM | POA: Insufficient documentation

## 2013-11-19 DIAGNOSIS — R079 Chest pain, unspecified: Secondary | ICD-10-CM | POA: Insufficient documentation

## 2013-11-19 LAB — I-STAT TROPONIN, ED
TROPONIN I, POC: 0.02 ng/mL (ref 0.00–0.08)
Troponin i, poc: 0.03 ng/mL (ref 0.00–0.08)

## 2013-11-19 LAB — URINALYSIS, ROUTINE W REFLEX MICROSCOPIC
Bilirubin Urine: NEGATIVE
GLUCOSE, UA: NEGATIVE mg/dL
Hgb urine dipstick: NEGATIVE
KETONES UR: NEGATIVE mg/dL
Leukocytes, UA: NEGATIVE
Nitrite: NEGATIVE
Protein, ur: NEGATIVE mg/dL
Specific Gravity, Urine: 1.017 (ref 1.005–1.030)
Urobilinogen, UA: 0.2 mg/dL (ref 0.0–1.0)
pH: 5 (ref 5.0–8.0)

## 2013-11-19 LAB — PROTIME-INR
INR: 2.16 — ABNORMAL HIGH (ref 0.00–1.49)
Prothrombin Time: 24.1 seconds — ABNORMAL HIGH (ref 11.6–15.2)

## 2013-11-19 LAB — CBC WITH DIFFERENTIAL/PLATELET
BASOS ABS: 0 10*3/uL (ref 0.0–0.1)
Basophils Relative: 0 % (ref 0–1)
EOS ABS: 0 10*3/uL (ref 0.0–0.7)
Eosinophils Relative: 0 % (ref 0–5)
HCT: 32.9 % — ABNORMAL LOW (ref 36.0–46.0)
Hemoglobin: 10.7 g/dL — ABNORMAL LOW (ref 12.0–15.0)
LYMPHS ABS: 1.1 10*3/uL (ref 0.7–4.0)
Lymphocytes Relative: 9 % — ABNORMAL LOW (ref 12–46)
MCH: 30.1 pg (ref 26.0–34.0)
MCHC: 32.5 g/dL (ref 30.0–36.0)
MCV: 92.7 fL (ref 78.0–100.0)
Monocytes Absolute: 0.8 10*3/uL (ref 0.1–1.0)
Monocytes Relative: 6 % (ref 3–12)
Neutro Abs: 11.3 10*3/uL — ABNORMAL HIGH (ref 1.7–7.7)
Neutrophils Relative %: 85 % — ABNORMAL HIGH (ref 43–77)
Platelets: 170 10*3/uL (ref 150–400)
RBC: 3.55 MIL/uL — ABNORMAL LOW (ref 3.87–5.11)
RDW: 14.1 % (ref 11.5–15.5)
WBC: 13.3 10*3/uL — ABNORMAL HIGH (ref 4.0–10.5)

## 2013-11-19 LAB — BASIC METABOLIC PANEL
Anion gap: 15 (ref 5–15)
BUN: 30 mg/dL — ABNORMAL HIGH (ref 6–23)
CO2: 22 mEq/L (ref 19–32)
Calcium: 9.2 mg/dL (ref 8.4–10.5)
Chloride: 100 mEq/L (ref 96–112)
Creatinine, Ser: 0.69 mg/dL (ref 0.50–1.10)
GFR calc Af Amer: 82 mL/min — ABNORMAL LOW (ref >90)
GFR calc non Af Amer: 71 mL/min — ABNORMAL LOW (ref >90)
GLUCOSE: 135 mg/dL — AB (ref 70–99)
Potassium: 4 mEq/L (ref 3.7–5.3)
SODIUM: 137 meq/L (ref 137–147)

## 2013-11-19 LAB — D-DIMER, QUANTITATIVE: D-Dimer, Quant: 0.33 ug/mL-FEU (ref 0.00–0.48)

## 2013-11-19 LAB — CBG MONITORING, ED: GLUCOSE-CAPILLARY: 115 mg/dL — AB (ref 70–99)

## 2013-11-19 MED ORDER — AZITHROMYCIN 250 MG PO TABS: 250.0000 mg | ORAL_TABLET | Freq: Every day | ORAL | Status: DC

## 2013-11-19 NOTE — ED Notes (Addendum)
[facility] with chest pain that started around midnight tonight. Described as stabbing pain that radiated across her chest and to back intermittently, shortness of breath. Denies diaphoresis, N/V. Patient has a NTG patch on left arm. VSS, A/O. Patient was given asprin 324mg  by EMS.

## 2013-11-19 NOTE — ED Notes (Signed)
CBG Taken = 115 

## 2013-11-19 NOTE — ED Provider Notes (Signed)
CSN: [id number]     Arrival date & time 11/19/13  0208 History   First MD Initiated Contact with Patient 11/19/13 [phone]     Chief Complaint  Patient presents with  . Chest Pain     (Consider location/radiation/quality/duration/timing/severity/associated sxs/prior Treatment) HPI This is a very pleasant 80 yo woman with multiple chronic medical problems including CAD - s/p PCA x 2, TIA, AF, HTN and others.   She comes in after experiencing sharp diffuse anterior chest pain which seemed most significant over the right mid chest. Sx began spontaneously around 1700 while at rest. It has been intermittent since then. Patient took 3 SL NTG and pain resolved. However, it recurred. Patient denies any history of similar pain. She is pain free at this time. Pain was 7/10 at most severe. Nothing seemed to bring on pain, make it worse or relieve it. Patient notes that pain seemed to build up over time.   Patient says she had "a little fever" and says she felt like she was breathing fast. This has resolved. She denies feeling SOB. NO cough. No abdominal pain.   Past Medical History  Diagnosis Date  . Transient ischemic attack (TIA) 1994  . Atrial fibrillation   . Hypertension   . Dyslipidemia   . Gastritis   . Vitamin B12 deficiency   . Peptic ulcer disease   . Osteopenia   . Macular degeneration, right eye   . Arthritis     back, shoulders  . Cough 05/21/2011    pt states cough x 2 days  . Edema     left leg  . Hypoglycemia   . Urinary incontinence   . CAD 06/15/2009  . Sinoatrial node dysfunction 06/15/2009    Dual-chamber PM inserted 03/18/09 St. Jude Zephyr XL DR dual-chamber, seriel [id number].  . Tachy-brady syndrome     Dual-chamber PM inserted 03/18/09 St. Jude Zephyr XL DR dual-chamber, seriel [id number].  Marland Kitchen CAD (coronary artery disease)     With LAD & RCA BMS, 2008 & LAD DES 2009 for restenosis. Both vessels patent by cath 03/18/09  . Atrial flutter   . Left heart failure   . Dyspnea    frequent  . Rectal bleeding     Aggravated by a combo of coumadin & aspirin. D/C'ed aspirin.  . Chronic diastolic congestive heart failure   . Asthma with COPD   . GERD (gastroesophageal reflux disease)   . Irritable bowel   . Hip fracture, right   . Anorexia 08/27/12    Nearly 20 lbs weight loss over past year. Anorexia weight loss or major problems now & likely represent a slow progression/worsening of the terminal illness, perhaps GI. Probably time to start considering a change in care plan from aggresive/diagnostic to palliative (Dr. Verdis Prime 08/17/12)  . Coronary atherosclerosis of native coronary artery   . Chest discomfort 08/27/12    Nearly continuous.  . Poor appetite    Past Surgical History  Procedure Laterality Date  . Perforated ulcer    . Eye surgery      bilateral  . Pacemaker insertion  03/18/09    For symptomatic brady-tachy syndrome. St. Jude Zephyr XL DR dual-chamber, seriel [id number]  . Hip surgery      broke hip and has pin in it  . Drug-eluting stent  [id number]    LAD & RCA BMS in 2008 & LAD DES in 2009 for restenosis. Both vessels patent by cath 03/18/09.  . Cardiac catheterization  03/18/09  With LAD & RCA BMS in 2008 & LAD DES in 2009 for restenosis.  . Varicose vein surgery    . Orif hip fracture Right     Pin in hip  . Cataract extraction Bilateral    Family History  Problem Relation Age of Onset  . CVA Mother   . Brain cancer Sister   . Heart attack Father   . Lymphoma Son   . CAD Son   . Testicular cancer Brother   . Alzheimer's disease Brother    History  Substance Use Topics  . Smoking status: Former Smoker    Quit date: 03/14/1996  . Smokeless tobacco: Never Used  . Alcohol Use: No   OB History   Grav Para Term Preterm Abortions TAB SAB Ect Mult Living                 Review of Systems Ten point review of symptoms performed and is negative with the exception of symptoms noted above.    Allergies  Ampicillin; Ciprofloxin hcl;  Demerol; and Sulfa antibiotics  Home Medications   Prior to Admission medications   Medication Sig Start Date End Date Taking? Authorizing Provider  acetaminophen (TYLENOL) 650 MG CR tablet Take 650 mg by mouth as needed for pain.    Historical Provider, MD  albuterol (PROVENTIL) (5 MG/ML) 0.5% nebulizer solution Take 0.5 mLs (2.5 mg total) by nebulization every 6 (six) hours as needed for wheezing or shortness of breath. 05/11/13   Alison Murray, MD  albuterol (VENTOLIN HFA) 108 (90 BASE) MCG/ACT inhaler Inhale 2 puffs into the lungs every 4 (four) hours as needed for wheezing or shortness of breath. Wait 1 minute between each puff.    Historical Provider, MD  atorvastatin (LIPITOR) 10 MG tablet Take 10 mg by mouth every evening.     Historical Provider, MD  bisacodyl (DULCOLAX) 10 MG suppository Place 1 suppository (10 mg total) rectally daily as needed for moderate constipation. 05/11/13   Alison Murray, MD  buprenorphine Lavera Guise) 5 MCG/HR PTWK patch Place 5 mcg onto the skin once a week. Apply new patch every Monday    Historical Provider, MD  calcium carbonate (OS-CAL) 600 MG TABS Take 600 mg by mouth 2 (two) times daily.      Historical Provider, MD  Cholecalciferol (VITAMIN D3) 1000 UNITS CAPS Take 1 capsule by mouth 2 (two) times daily.     Historical Provider, MD  Cyanocobalamin (VITAMIN B-12) 1000 MCG SUBL Place 1 tablet under the tongue daily.    Historical Provider, MD  docusate sodium 100 MG CAPS Take 100 mg by mouth 2 (two) times daily. 05/11/13   Alison Murray, MD  Fluticasone-Salmeterol (ADVAIR DISKUS) 100-50 MCG/DOSE AEPB Inhale 1 puff into the lungs 2 (two) times daily. Rinse mouth after use.    Historical Provider, MD  furosemide (LASIX) 40 MG tablet Take 40 mg by mouth daily.    Historical Provider, MD  guaiFENesin (MUCINEX) 600 MG 12 hr tablet Take 600 mg by mouth 2 (two) times daily.    Historical Provider, MD  HYDROcodone-acetaminophen (NORCO/VICODIN) 5-325 MG per tablet  Take 1-2 tablets by mouth 3 (three) times daily. 05/11/13   Alison Murray, MD  magnesium hydroxide (MILK OF MAGNESIA) 400 MG/5ML suspension Take 5 mLs by mouth daily as needed for mild constipation (may mix with prune juice for better effect). 05/11/13   Alison Murray, MD  metoprolol succinate (TOPROL-XL) 50 MG 24 hr tablet Take 1 tablet (  50 mg total) by mouth daily. Take with or immediately following a meal. 04/29/13   Hillis RangeJames Allred, MD  mirtazapine (REMERON) 7.5 MG tablet Take 1 tablet (7.5 mg total) by mouth at bedtime. 05/11/13   Alison MurrayAlma M Devine, MD  Multiple Vitamins-Minerals (CERTAVITE/ANTIOXIDANTS) TABS Take 1 tablet by mouth daily.    Historical Provider, MD  Multiple Vitamins-Minerals (ICAPS AREDS FORMULA PO) Take 1 tablet by mouth 2 (two) times daily.      Historical Provider, MD  nitroGLYCERIN (NITRODUR - DOSED IN MG/24 HR) 0.4 mg/hr Place 1 patch onto the skin every evening. Nitrate free interval from 4pm - 8pm every day    Historical Provider, MD  nitroGLYCERIN (NITROSTAT) 0.4 MG SL tablet Place 0.4 mg under the tongue every 5 (five) minutes as needed for chest pain (up to 3 doses).     Historical Provider, MD  Omega-3 Fatty Acids (FISH OIL) 1000 MG CAPS Take 1,000 mg by mouth 2 (two) times daily.    Historical Provider, MD  oxybutynin (DITROPAN-XL) 10 MG 24 hr tablet Take 10 mg by mouth at bedtime.    Historical Provider, MD  pantoprazole (PROTONIX) 40 MG tablet Take 40 mg by mouth 2 (two) times daily.    Historical Provider, MD  polyethylene glycol (MIRALAX / GLYCOLAX) packet Take 17 g by mouth every other day.     Historical Provider, MD  potassium chloride (K-DUR,KLOR-CON) 10 MEQ tablet Take 10 mEq by mouth daily.    Historical Provider, MD  warfarin (COUMADIN) 7.5 MG tablet Take 3.75 mg by mouth daily at 6 PM.    Historical Provider, MD   BP 94/53  Pulse 60  Resp 20  SpO2 96% Physical Exam Gen: well developed and well nourished appearing, no acute distress.  Head: NCAT Eyes:  PERL, EOMI Nose: no epistaixis or rhinorrhea Mouth/throat: mucosa is moist and pink Neck: supple, no stridor Lungs: CTA B, no wheezing, rhonchi or rales CV: RRR, no murmur, extremities appear well perfused.  Chest wall: normal to inspection, mild point ttp over the left anterior chest - mid clavicular line at approx the T6 level.  Abd: soft, notender, nondistended Back: no ttp, no cva ttp Skin: warm and dry Ext: normal to inspection, no dependent edema Neuro: CN ii-xii grossly intact, no focal deficits Psyche; normal affect,  calm and cooperative.   ED Course  Procedures (including critical care time) Labs Review  Results for orders placed during the hospital encounter of 11/19/13 (from the past 24 hour(s))  CBC WITH DIFFERENTIAL     Status: Abnormal   Collection Time    11/19/13  3:20 AM      Result Value Ref Range   WBC 13.3 (*) 4.0 - 10.5 K/uL   RBC 3.55 (*) 3.87 - 5.11 MIL/uL   Hemoglobin 10.7 (*) 12.0 - 15.0 g/dL   HCT 02.732.9 (*) 25.336.0 - 66.446.0 %   MCV 92.7  78.0 - 100.0 fL   MCH 30.1  26.0 - 34.0 pg   MCHC 32.5  30.0 - 36.0 g/dL   RDW 40.314.1  47.411.5 - 25.915.5 %   Platelets 170  150 - 400 K/uL   Neutrophils Relative % 85 (*) 43 - 77 %   Neutro Abs 11.3 (*) 1.7 - 7.7 K/uL   Lymphocytes Relative 9 (*) 12 - 46 %   Lymphs Abs 1.1  0.7 - 4.0 K/uL   Monocytes Relative 6  3 - 12 %   Monocytes Absolute 0.8  0.1 - 1.0 K/uL  Eosinophils Relative 0  0 - 5 %   Eosinophils Absolute 0.0  0.0 - 0.7 K/uL   Basophils Relative 0  0 - 1 %   Basophils Absolute 0.0  0.0 - 0.1 K/uL  I-STAT TROPOININ, ED     Status: None   Collection Time    11/19/13  3:24 AM      Result Value Ref Range   Troponin i, poc 0.03  0.00 - 0.08 ng/mL   Comment 3            EKG: nsr, no acute ischemic changes, normal intervals, normal axis, normal qrs complex  DG Chest Port 1 View (Final result)  Result time: 11/19/13 03:33:43    Final result by Rad Results In Interface (11/19/13 03:33:43)    Narrative:    CLINICAL DATA: chest pain  EXAM: PORTABLE CHEST - 1 VIEW  COMPARISON: Prior radiograph from 10/10/2013  FINDINGS: Cardiac silhouette is stable as compared to prior study. Cardiac stent noted. Tortuosity of the intrathoracic aorta is unchanged. Atherosclerotic calcifications present within the aortic arch. Left-sided transvenous pacemaker/AICD with electrodes overlying the right atrium and right ventricle is stable. Surgical clips overlie the GE junction.  Lungs are normally inflated. Chronic coarsening of the interstitial markings is stable. No focal infiltrate, pulmonary edema, or pleural effusion. Mild bibasilar atelectasis is present. No pneumothorax.  Diffuse osteopenia noted. No acute osseus abnormality. Prominent degenerative changes present about the shoulders.  IMPRESSION: Mild bibasilar atelectasis. Otherwise stable exam with no other acute cardiopulmonary abnormality.   Electronically Signed By: Rise Mu M.D. On: 11/19/2013 03:33      MDM   DDX: ACS, pneumothorax, pneumonia, pericardial or pleural effusion, gastritis, GERD/PUD, musculoskeletal pain.   1914: ED work up is concerning for early pneumonia with early infiltrate v. Atelectasis at right base. Given pleuritic nature of right sided chest pain, i have opted to treat empirically for CAP with Zpack. Patient's son will insure close outpatient follow up. Counseled re: return precautions.   Brandt Loosen, MD 11/19/13 [phone]

## 2013-11-19 NOTE — Discharge Instructions (Signed)
Bronchitis  Bronchitis is inflammation of the airways that extend from the windpipe into the lungs (bronchi). The inflammation often causes mucus to develop, which leads to a cough. If the inflammation becomes severe, it may cause shortness of breath.  CAUSES   Bronchitis may be caused by:    Viral infections.    Bacteria.    Cigarette smoke.    Allergens, pollutants, and other irritants.   SIGNS AND SYMPTOMS   The most common symptom of bronchitis is a frequent cough that produces mucus. Other symptoms include:   Fever.    Body aches.    Chest congestion.    Chills.    Shortness of breath.    Sore throat.   DIAGNOSIS   Bronchitis is usually diagnosed through a medical history and physical exam. Tests, such as chest X-rays, are sometimes done to rule out other conditions.   TREATMENT   You may need to avoid contact with whatever caused the problem (smoking, for example). Medicines are sometimes needed. These may include:   Antibiotics. These may be prescribed if the condition is caused by bacteria.   Cough suppressants. These may be prescribed for relief of cough symptoms.    Inhaled medicines. These may be prescribed to help open your airways and make it easier for you to breathe.    Steroid medicines. These may be prescribed for those with recurrent (chronic) bronchitis.  HOME CARE INSTRUCTIONS   Get plenty of rest.    Drink enough fluids to keep your urine clear or pale yellow (unless you have a medical condition that requires fluid restriction). Increasing fluids may help thin your secretions and will prevent dehydration.    Only take over-the-counter or prescription medicines as directed by your health care provider.   Only take antibiotics as directed. Make sure you finish them even if you start to feel better.   Avoid secondhand smoke, irritating chemicals, and strong fumes. These will make bronchitis worse. If you are a smoker, quit smoking. Consider using nicotine gum or  skin patches to help control withdrawal symptoms. Quitting smoking will help your lungs heal faster.    Put a cool-mist humidifier in your bedroom at night to moisten the air. This may help loosen mucus. Change the water in the humidifier daily. You can also run the hot water in your shower and sit in the bathroom with the door closed for 5-10 minutes.    Follow up with your health care provider as directed.    Wash your hands frequently to avoid catching bronchitis again or spreading an infection to others.   SEEK MEDICAL CARE IF:  Your symptoms do not improve after 1 week of treatment.   SEEK IMMEDIATE MEDICAL CARE IF:   Your fever increases.   You have chills.    You have chest pain.    You have worsening shortness of breath.    You have bloody sputum.   You faint.   You have lightheadedness.   You have a severe headache.    You vomit repeatedly.  MAKE SURE YOU:    Understand these instructions.   Will watch your condition.   Will get help right away if you are not doing well or get worse.  Document Released: 04/30/2005 Document Revised: 02/18/2013 Document Reviewed: 12/23/2012  [employer] Patient Information 2015 [employer]. This information is not intended to replace advice given to you by your health care provider. Make sure you discuss any questions you have with your health care   provider.

## 2013-11-19 NOTE — ED Notes (Signed)
Family at bedside. 

## 2014-02-28 ENCOUNTER — Emergency Department (HOSPITAL_COMMUNITY): Payer: Medicare Other

## 2014-02-28 ENCOUNTER — Inpatient Hospital Stay (HOSPITAL_COMMUNITY)
Admission: EM | Admit: 2014-02-28 | Discharge: 2014-03-04 | DRG: 392 | Disposition: A | Discharge status: Skilled Nursing Facility | Payer: Medicare Other | Attending: Internal Medicine | Admitting: Internal Medicine

## 2014-02-28 ENCOUNTER — Encounter (HOSPITAL_COMMUNITY): Payer: Self-pay | Admitting: Emergency Medicine

## 2014-02-28 ENCOUNTER — Inpatient Hospital Stay (HOSPITAL_COMMUNITY): Payer: Medicare Other

## 2014-02-28 DIAGNOSIS — I10 Essential (primary) hypertension: Secondary | ICD-10-CM | POA: Diagnosis present

## 2014-02-28 DIAGNOSIS — J45909 Unspecified asthma, uncomplicated: Secondary | ICD-10-CM | POA: Diagnosis present

## 2014-02-28 DIAGNOSIS — R1011 Right upper quadrant pain: Secondary | ICD-10-CM

## 2014-02-28 DIAGNOSIS — N390 Urinary tract infection, site not specified: Secondary | ICD-10-CM | POA: Diagnosis present

## 2014-02-28 DIAGNOSIS — Z87891 Personal history of nicotine dependence: Secondary | ICD-10-CM | POA: Diagnosis not present

## 2014-02-28 DIAGNOSIS — K838 Other specified diseases of biliary tract: Secondary | ICD-10-CM | POA: Diagnosis present

## 2014-02-28 DIAGNOSIS — I4891 Unspecified atrial fibrillation: Secondary | ICD-10-CM | POA: Diagnosis present

## 2014-02-28 DIAGNOSIS — H3530 Unspecified macular degeneration: Secondary | ICD-10-CM | POA: Diagnosis present

## 2014-02-28 DIAGNOSIS — M13812 Other specified arthritis, left shoulder: Secondary | ICD-10-CM | POA: Diagnosis present

## 2014-02-28 DIAGNOSIS — G8929 Other chronic pain: Secondary | ICD-10-CM

## 2014-02-28 DIAGNOSIS — M4690 Unspecified inflammatory spondylopathy, site unspecified: Secondary | ICD-10-CM | POA: Diagnosis present

## 2014-02-28 DIAGNOSIS — M13811 Other specified arthritis, right shoulder: Secondary | ICD-10-CM | POA: Diagnosis present

## 2014-02-28 DIAGNOSIS — J449 Chronic obstructive pulmonary disease, unspecified: Secondary | ICD-10-CM | POA: Diagnosis present

## 2014-02-28 DIAGNOSIS — I482 Chronic atrial fibrillation, unspecified: Secondary | ICD-10-CM

## 2014-02-28 DIAGNOSIS — K589 Irritable bowel syndrome without diarrhea: Secondary | ICD-10-CM | POA: Diagnosis present

## 2014-02-28 DIAGNOSIS — Z880 Allergy status to penicillin: Secondary | ICD-10-CM

## 2014-02-28 DIAGNOSIS — I959 Hypotension, unspecified: Secondary | ICD-10-CM | POA: Diagnosis present

## 2014-02-28 DIAGNOSIS — E785 Hyperlipidemia, unspecified: Secondary | ICD-10-CM | POA: Diagnosis present

## 2014-02-28 DIAGNOSIS — Z66 Do not resuscitate: Secondary | ICD-10-CM | POA: Diagnosis present

## 2014-02-28 DIAGNOSIS — K5909 Other constipation: Secondary | ICD-10-CM

## 2014-02-28 DIAGNOSIS — I2510 Atherosclerotic heart disease of native coronary artery without angina pectoris: Secondary | ICD-10-CM | POA: Diagnosis present

## 2014-02-28 DIAGNOSIS — K831 Obstruction of bile duct: Secondary | ICD-10-CM

## 2014-02-28 DIAGNOSIS — E538 Deficiency of other specified B group vitamins: Secondary | ICD-10-CM | POA: Diagnosis present

## 2014-02-28 DIAGNOSIS — K5900 Constipation, unspecified: Secondary | ICD-10-CM | POA: Diagnosis not present

## 2014-02-28 DIAGNOSIS — Z885 Allergy status to narcotic agent status: Secondary | ICD-10-CM | POA: Diagnosis not present

## 2014-02-28 DIAGNOSIS — Z7901 Long term (current) use of anticoagulants: Secondary | ICD-10-CM

## 2014-02-28 DIAGNOSIS — I5032 Chronic diastolic (congestive) heart failure: Secondary | ICD-10-CM | POA: Diagnosis present

## 2014-02-28 DIAGNOSIS — Z950 Presence of cardiac pacemaker: Secondary | ICD-10-CM

## 2014-02-28 DIAGNOSIS — Z823 Family history of stroke: Secondary | ICD-10-CM

## 2014-02-28 DIAGNOSIS — Z955 Presence of coronary angioplasty implant and graft: Secondary | ICD-10-CM | POA: Diagnosis not present

## 2014-02-28 DIAGNOSIS — K828 Other specified diseases of gallbladder: Secondary | ICD-10-CM | POA: Diagnosis present

## 2014-02-28 DIAGNOSIS — Z882 Allergy status to sulfonamides status: Secondary | ICD-10-CM

## 2014-02-28 DIAGNOSIS — I4892 Unspecified atrial flutter: Secondary | ICD-10-CM | POA: Diagnosis present

## 2014-02-28 DIAGNOSIS — I495 Sick sinus syndrome: Secondary | ICD-10-CM | POA: Diagnosis present

## 2014-02-28 DIAGNOSIS — D638 Anemia in other chronic diseases classified elsewhere: Secondary | ICD-10-CM

## 2014-02-28 DIAGNOSIS — Z79899 Other long term (current) drug therapy: Secondary | ICD-10-CM | POA: Diagnosis not present

## 2014-02-28 DIAGNOSIS — Z881 Allergy status to other antibiotic agents status: Secondary | ICD-10-CM | POA: Diagnosis not present

## 2014-02-28 DIAGNOSIS — I4819 Other persistent atrial fibrillation: Secondary | ICD-10-CM

## 2014-02-28 DIAGNOSIS — Z8673 Personal history of transient ischemic attack (TIA), and cerebral infarction without residual deficits: Secondary | ICD-10-CM

## 2014-02-28 DIAGNOSIS — Z8249 Family history of ischemic heart disease and other diseases of the circulatory system: Secondary | ICD-10-CM

## 2014-02-28 DIAGNOSIS — K219 Gastro-esophageal reflux disease without esophagitis: Secondary | ICD-10-CM | POA: Diagnosis present

## 2014-02-28 DIAGNOSIS — M8580 Other specified disorders of bone density and structure, unspecified site: Secondary | ICD-10-CM | POA: Diagnosis present

## 2014-02-28 DIAGNOSIS — M4800 Spinal stenosis, site unspecified: Secondary | ICD-10-CM | POA: Diagnosis present

## 2014-02-28 DIAGNOSIS — Z8711 Personal history of peptic ulcer disease: Secondary | ICD-10-CM | POA: Diagnosis not present

## 2014-02-28 DIAGNOSIS — M545 Low back pain, unspecified: Secondary | ICD-10-CM | POA: Diagnosis present

## 2014-02-28 LAB — URINALYSIS, ROUTINE W REFLEX MICROSCOPIC
Bilirubin Urine: NEGATIVE
GLUCOSE, UA: NEGATIVE mg/dL
Hgb urine dipstick: NEGATIVE
Ketones, ur: NEGATIVE mg/dL
NITRITE: NEGATIVE
PROTEIN: NEGATIVE mg/dL
Specific Gravity, Urine: 1.005 (ref 1.005–1.030)
Urobilinogen, UA: 0.2 mg/dL (ref 0.0–1.0)
pH: 7.5 (ref 5.0–8.0)

## 2014-02-28 LAB — CBC WITH DIFFERENTIAL/PLATELET
BASOS ABS: 0 10*3/uL (ref 0.0–0.1)
Basophils Relative: 0 % (ref 0–1)
Eosinophils Absolute: 0 10*3/uL (ref 0.0–0.7)
Eosinophils Relative: 1 % (ref 0–5)
HCT: 34.4 % — ABNORMAL LOW (ref 36.0–46.0)
Hemoglobin: 11 g/dL — ABNORMAL LOW (ref 12.0–15.0)
LYMPHS ABS: 1.4 10*3/uL (ref 0.7–4.0)
LYMPHS PCT: 22 % (ref 12–46)
MCH: 29.8 pg (ref 26.0–34.0)
MCHC: 32 g/dL (ref 30.0–36.0)
MCV: 93.2 fL (ref 78.0–100.0)
Monocytes Absolute: 0.8 10*3/uL (ref 0.1–1.0)
Monocytes Relative: 12 % (ref 3–12)
NEUTROS ABS: 4.2 10*3/uL (ref 1.7–7.7)
NEUTROS PCT: 65 % (ref 43–77)
PLATELETS: 195 10*3/uL (ref 150–400)
RBC: 3.69 MIL/uL — AB (ref 3.87–5.11)
RDW: 13.3 % (ref 11.5–15.5)
WBC: 6.4 10*3/uL (ref 4.0–10.5)

## 2014-02-28 LAB — HEPATIC FUNCTION PANEL
ALBUMIN: 3 g/dL — AB (ref 3.5–5.2)
ALT: 21 U/L (ref 0–35)
AST: 23 U/L (ref 0–37)
Alkaline Phosphatase: 78 U/L (ref 39–117)
Bilirubin, Direct: <0.2 mg/dL (ref 0.0–0.3)
Total Bilirubin: 0.4 mg/dL (ref 0.3–1.2)
Total Protein: 6.7 g/dL (ref 6.0–8.3)

## 2014-02-28 LAB — BASIC METABOLIC PANEL
ANION GAP: 11 (ref 5–15)
BUN: 14 mg/dL (ref 6–23)
CO2: 28 meq/L (ref 19–32)
Calcium: 9.7 mg/dL (ref 8.4–10.5)
Chloride: 103 mEq/L (ref 96–112)
Creatinine, Ser: 0.65 mg/dL (ref 0.50–1.10)
GFR calc Af Amer: 84 mL/min — ABNORMAL LOW (ref >90)
GFR calc non Af Amer: 73 mL/min — ABNORMAL LOW (ref >90)
GLUCOSE: 100 mg/dL — AB (ref 70–99)
POTASSIUM: 3.9 meq/L (ref 3.7–5.3)
SODIUM: 142 meq/L (ref 137–147)

## 2014-02-28 LAB — URINE MICROSCOPIC-ADD ON

## 2014-02-28 LAB — PROTIME-INR
INR: 2.91 — AB (ref 0.00–1.49)
Prothrombin Time: 30.6 seconds — ABNORMAL HIGH (ref 11.6–15.2)

## 2014-02-28 MED ORDER — IOHEXOL 300 MG/ML  SOLN
100.0000 mL | Freq: Once | INTRAMUSCULAR | Status: AC | PRN
  Administered 2014-02-28: 100 mL via INTRAVENOUS

## 2014-02-28 MED ORDER — FUROSEMIDE 40 MG PO TABS
40.0000 mg | ORAL_TABLET | Freq: Every day | ORAL | Status: DC
  Administered 2014-03-01 – 2014-03-04 (×4): 40 mg via ORAL
  Filled 2014-02-28 (×4): qty 1

## 2014-02-28 MED ORDER — ONDANSETRON HCL 4 MG/2ML IJ SOLN
4.0000 mg | Freq: Four times a day (QID) | INTRAMUSCULAR | Status: DC | PRN
Start: 2014-02-28 — End: 2014-03-04

## 2014-02-28 MED ORDER — GUAIFENESIN ER 600 MG PO TB12
600.0000 mg | ORAL_TABLET | Freq: Two times a day (BID) | ORAL | Status: DC
  Administered 2014-02-28 – 2014-03-04 (×8): 600 mg via ORAL
  Filled 2014-02-28 (×9): qty 1

## 2014-02-28 MED ORDER — SODIUM CHLORIDE 0.9 % IV SOLN
INTRAVENOUS | Status: AC
  Administered 2014-02-28: 23:00:00 via INTRAVENOUS

## 2014-02-28 MED ORDER — BISACODYL 10 MG RE SUPP
10.0000 mg | RECTAL | Status: DC | PRN
  Filled 2014-02-28: qty 1

## 2014-02-28 MED ORDER — MIRTAZAPINE 7.5 MG PO TABS
7.5000 mg | ORAL_TABLET | Freq: Every day | ORAL | Status: DC
  Administered 2014-02-28 – 2014-03-03 (×4): 7.5 mg via ORAL
  Filled 2014-02-28 (×6): qty 1

## 2014-02-28 MED ORDER — FLEET ENEMA 7-19 GM/118ML RE ENEM
1.0000 | ENEMA | Freq: Once | RECTAL | Status: AC | PRN
  Filled 2014-02-28: qty 1

## 2014-02-28 MED ORDER — MORPHINE SULFATE 2 MG/ML IJ SOLN
2.0000 mg | Freq: Once | INTRAMUSCULAR | Status: AC
  Administered 2014-02-28: 2 mg via INTRAVENOUS
  Filled 2014-02-28: qty 1

## 2014-02-28 MED ORDER — DOCUSATE SODIUM 100 MG PO CAPS
200.0000 mg | ORAL_CAPSULE | Freq: Every day | ORAL | Status: DC
  Administered 2014-03-01 – 2014-03-04 (×4): 200 mg via ORAL
  Filled 2014-02-28 (×4): qty 2

## 2014-02-28 MED ORDER — ALBUTEROL SULFATE (2.5 MG/3ML) 0.083% IN NEBU: 2.5000 mg | INHALATION_SOLUTION | Freq: Four times a day (QID) | RESPIRATORY_TRACT | Status: DC | PRN

## 2014-02-28 MED ORDER — ONDANSETRON HCL 4 MG PO TABS: 4.0000 mg | ORAL_TABLET | Freq: Four times a day (QID) | ORAL | Status: DC | PRN

## 2014-02-28 MED ORDER — POLYETHYLENE GLYCOL 3350 17 G PO PACK
17.0000 g | PACK | Freq: Two times a day (BID) | ORAL | Status: DC
  Administered 2014-03-01 – 2014-03-04 (×6): 17 g via ORAL
  Filled 2014-02-28 (×8): qty 1

## 2014-02-28 MED ORDER — OXYBUTYNIN CHLORIDE ER 10 MG PO TB24
10.0000 mg | ORAL_TABLET | Freq: Every day | ORAL | Status: DC
  Administered 2014-02-28 – 2014-03-03 (×4): 10 mg via ORAL
  Filled 2014-02-28 (×5): qty 1

## 2014-02-28 MED ORDER — ALBUTEROL SULFATE (2.5 MG/3ML) 0.083% IN NEBU: 2.5000 mg | INHALATION_SOLUTION | RESPIRATORY_TRACT | Status: DC | PRN

## 2014-02-28 MED ORDER — DEXTROSE 5 % IV SOLN
1.0000 g | INTRAVENOUS | Status: DC
  Administered 2014-02-28: 1 g via INTRAVENOUS
  Filled 2014-02-28: qty 10

## 2014-02-28 MED ORDER — MORPHINE SULFATE 2 MG/ML IJ SOLN
2.0000 mg | INTRAMUSCULAR | Status: DC | PRN
  Administered 2014-03-01 – 2014-03-03 (×7): 2 mg via INTRAVENOUS
  Filled 2014-02-28 (×7): qty 1

## 2014-02-28 MED ORDER — MOMETASONE FURO-FORMOTEROL FUM 100-5 MCG/ACT IN AERO
2.0000 | INHALATION_SPRAY | Freq: Two times a day (BID) | RESPIRATORY_TRACT | Status: DC
  Administered 2014-02-28 – 2014-03-04 (×8): 2 via RESPIRATORY_TRACT
  Filled 2014-02-28: qty 8.8

## 2014-02-28 MED ORDER — METOPROLOL SUCCINATE ER 50 MG PO TB24
50.0000 mg | ORAL_TABLET | Freq: Every day | ORAL | Status: DC
  Administered 2014-03-02 – 2014-03-04 (×3): 50 mg via ORAL
  Filled 2014-02-28 (×4): qty 1

## 2014-02-28 MED ORDER — IOHEXOL 300 MG/ML  SOLN
50.0000 mL | Freq: Once | INTRAMUSCULAR | Status: AC | PRN
  Administered 2014-02-28: 50 mL via ORAL

## 2014-02-28 MED ORDER — CEPHALEXIN 250 MG PO CAPS
500.0000 mg | ORAL_CAPSULE | Freq: Once | ORAL | Status: AC
  Administered 2014-02-28: 500 mg via ORAL
  Filled 2014-02-28: qty 2

## 2014-02-28 MED ORDER — ACETAMINOPHEN 650 MG RE SUPP: 650.0000 mg | Freq: Four times a day (QID) | RECTAL | Status: DC | PRN

## 2014-02-28 MED ORDER — POTASSIUM CHLORIDE CRYS ER 10 MEQ PO TBCR
10.0000 meq | EXTENDED_RELEASE_TABLET | Freq: Every day | ORAL | Status: DC
  Administered 2014-03-01 – 2014-03-04 (×4): 10 meq via ORAL
  Filled 2014-02-28 (×4): qty 1

## 2014-02-28 MED ORDER — ATORVASTATIN CALCIUM 10 MG PO TABS
10.0000 mg | ORAL_TABLET | Freq: Every evening | ORAL | Status: DC
  Administered 2014-02-28 – 2014-03-03 (×4): 10 mg via ORAL
  Filled 2014-02-28 (×5): qty 1

## 2014-02-28 MED ORDER — ACETAMINOPHEN 325 MG PO TABS: 650.0000 mg | ORAL_TABLET | Freq: Four times a day (QID) | ORAL | Status: DC | PRN

## 2014-02-28 MED ORDER — HYDROCODONE-ACETAMINOPHEN 5-325 MG PO TABS
1.0000 | ORAL_TABLET | Freq: Once | ORAL | Status: AC
  Administered 2014-02-28: 1 via ORAL
  Filled 2014-02-28: qty 1

## 2014-02-28 MED ORDER — FENTANYL CITRATE 0.05 MG/ML IJ SOLN
50.0000 ug | Freq: Once | INTRAMUSCULAR | Status: AC
  Administered 2014-02-28: 50 ug via INTRAVENOUS
  Filled 2014-02-28: qty 2

## 2014-02-28 NOTE — ED Notes (Signed)
Myself and Hayley, RN undressed pt, placed in gown, on monitor, continuous pulse oximetry and blood pressure cuff; DJ, EMT gave pt warm blankets

## 2014-02-28 NOTE — H&P (Signed)
PCP:  Londell Moh, MD    Chief Complaint:  Back pain  HPI: [name] is a 80 y.o. female   has a past medical history of Transient ischemic attack (TIA) (1994); Atrial fibrillation; Hypertension; Dyslipidemia; Gastritis; Vitamin B12 deficiency; Peptic ulcer disease; Osteopenia; Macular degeneration, right eye; Arthritis; Cough (05/21/2011); Edema; Hypoglycemia; Urinary incontinence; CAD (06/15/2009); Sinoatrial node dysfunction (06/15/2009); Tachy-brady syndrome; CAD (coronary artery disease); Atrial flutter; Left heart failure; Dyspnea; Rectal bleeding; Chronic diastolic congestive heart failure; Asthma with COPD; GERD (gastroesophageal reflux disease); Irritable bowel; Hip fracture, right; Anorexia (08/27/12); Coronary atherosclerosis of native coronary artery; Chest discomfort (08/27/12); and Poor appetite.   Presented with  Patient has hx of back pain due to spinal stenosis. Hs has hx of severe leg, back and hips pain. She has been treated with steroid injections and it has helped in the past but has worn off.  She is not interested in surgical intevennion for spinal stenosis. Her pain was controlled with IV Morphine in ER. A CT of abdomen has been ordered showing stool burden and dealated gall bladder no evidence of cholecystitis. Patient did not start mentioning abdominal discomfort until she came to ER. She reports left lower quadrant and suprapubic pain. NO BM for the past 5 days. No hx of dementia.   Hospitalist was called for admission for  back pain and abnormal CT  Review of Systems:    Pertinent positives include: bakpain, abdominal pain,   Constitutional:  No weight loss, night sweats, Fevers, chills, fatigue, weight loss  HEENT:  No headaches, Difficulty swallowing,Tooth/dental problems,Sore throat,  No sneezing, itching, ear ache, nasal congestion, post nasal drip,  Cardio-vascular:  No chest pain, Orthopnea, PND, anasarca, dizziness, palpitations.no Bilateral  lower extremity swelling  GI:  No heartburn, indigestion, nausea, vomiting, diarrhea, change in bowel habits, loss of appetite, melena, blood in stool, hematemesis Resp:  no shortness of breath at rest. No dyspnea on exertion, No excess mucus, no productive cough, No non-productive cough, No coughing up of blood.No change in color of mucus.No wheezing. Skin:  no rash or lesions. No jaundice GU:  no dysuria, change in color of urine, no urgency or frequency. No straining to urinate.  No flank pain.  Musculoskeletal:  No joint pain or no joint swelling. No decreased range of motion. No back pain.  Psych:  No change in mood or affect. No depression or anxiety. No memory loss.  Neuro: no localizing neurological complaints, no tingling, no weakness, no double vision, no gait abnormality, no slurred speech, no confusion  Otherwise ROS are negative except for above, 10 systems were reviewed  Past Medical History: Past Medical History  Diagnosis Date  . Transient ischemic attack (TIA) 1994  . Atrial fibrillation   . Hypertension   . Dyslipidemia   . Gastritis   . Vitamin B12 deficiency   . Peptic ulcer disease   . Osteopenia   . Macular degeneration, right eye   . Arthritis     back, shoulders  . Cough 05/21/2011    pt states cough x 2 days  . Edema     left leg  . Hypoglycemia   . Urinary incontinence   . CAD 06/15/2009  . Sinoatrial node dysfunction 06/15/2009    Dual-chamber PM inserted 03/18/09 St. Jude Zephyr XL DR dual-chamber, seriel [id number].  . Tachy-brady syndrome     Dual-chamber PM inserted 03/18/09 St. Jude Zephyr XL DR dual-chamber, seriel [id number].  Marland Kitchen CAD (coronary artery disease)  With LAD & RCA BMS, 2008 & LAD DES 2009 for restenosis. Both vessels patent by cath 03/18/09  . Atrial flutter   . Left heart failure   . Dyspnea     frequent  . Rectal bleeding     Aggravated by a combo of coumadin & aspirin. D/C'ed aspirin.  . Chronic diastolic congestive heart failure    . Asthma with COPD   . GERD (gastroesophageal reflux disease)   . Irritable bowel   . Hip fracture, right   . Anorexia 08/27/12    Nearly 20 lbs weight loss over past year. Anorexia weight loss or major problems now & likely represent a slow progression/worsening of the terminal illness, perhaps GI. Probably time to start considering a change in care plan from aggresive/diagnostic to palliative (Dr. Verdis PrimeHenry Smith 08/17/12)  . Coronary atherosclerosis of native coronary artery   . Chest discomfort 08/27/12    Nearly continuous.  . Poor appetite    Past Surgical History  Procedure Laterality Date  . Perforated ulcer    . Eye surgery      bilateral  . Pacemaker insertion  03/18/09    For symptomatic brady-tachy syndrome. St[employer] Jude Zephyr XL DR dual-chamber, seriel [id number]  . Hip surgery      broke hip and has pin in it  . Drug-eluting stent  [id number]    LAD & RCA BMS in 2008 & LAD DES in 2009 for restenosis. Both vessels patent by cath 03/18/09.  . Cardiac catheterization  03/18/09    With LAD & RCA BMS in 2008 & LAD DES in 2009 for restenosis.  . Varicose vein surgery    . Orif hip fracture Right     Pin in hip  . Cataract extraction Bilateral      Medications: Prior to Admission medications   Medication Sig Start Date End Date Taking? Authorizing Provider  acetaminophen (TYLENOL) 650 MG CR tablet Take 650 mg by mouth every 4 (four) hours as needed for pain or fever.    Yes Historical Provider, MD  albuterol (PROVENTIL) (5 MG/ML) 0.5% nebulizer solution Take 0.5 mLs (2.5 mg total) by nebulization every 6 (six) hours as needed for wheezing or shortness of breath. 05/11/13  Yes Alison MurrayAlma M Devine, MD  albuterol (VENTOLIN HFA) 108 (90 BASE) MCG/ACT inhaler Inhale 2 puffs into the lungs every 4 (four) hours as needed for wheezing or shortness of breath. Wait 1 minute between each puff.   Yes Historical Provider, MD  aluminum & magnesium hydroxide-simethicone (MYLANTA) 500-450-40 MG/5ML  suspension Take 30 mLs by mouth every 4 (four) hours as needed for indigestion.   Yes Historical Provider, MD  atorvastatin (LIPITOR) 10 MG tablet Take 10 mg by mouth every evening.    Yes Historical Provider, MD  bisacodyl (DULCOLAX) 10 MG suppository Place 10 mg rectally as needed for moderate constipation.   Yes Historical Provider, MD  Buprenorphine (BUTRANS) 7.5 MCG/HR PTWK Place 7.5 mcg onto the skin once a week.   Yes Historical Provider, MD  calcium carbonate (OS-CAL) 600 MG TABS Take 600 mg by mouth 2 (two) times daily.     Yes Historical Provider, MD  Cholecalciferol (VITAMIN D3) 1000 UNITS CAPS Take 1,000 Units by mouth 2 (two) times daily.    Yes Historical Provider, MD  Cyanocobalamin (VITAMIN B-12) 1000 MCG SUBL Place 1 tablet under the tongue daily.   Yes Historical Provider, MD  docusate sodium (COLACE) 100 MG capsule Take 200 mg by mouth daily.   Yes  Historical Provider, MD  Fluticasone-Salmeterol (ADVAIR DISKUS) 100-50 MCG/DOSE AEPB Inhale 1 puff into the lungs 2 (two) times daily. Rinse mouth after use.   Yes Historical Provider, MD  furosemide (LASIX) 40 MG tablet Take 40 mg by mouth daily.   Yes Historical Provider, MD  guaiFENesin (MUCINEX) 600 MG 12 hr tablet Take 600 mg by mouth 2 (two) times daily.   Yes Historical Provider, MD  HYDROcodone-acetaminophen (NORCO/VICODIN) 5-325 MG per tablet Take 1 tablet by mouth 4 (four) times daily as needed for moderate pain.   Yes Historical Provider, MD  magnesium hydroxide (MILK OF MAGNESIA) 400 MG/5ML suspension Take 30 mLs by mouth daily as needed for mild constipation.   Yes Historical Provider, MD  metoprolol succinate (TOPROL-XL) 50 MG 24 hr tablet Take 1 tablet (50 mg total) by mouth daily. Take with or immediately following a meal. 04/29/13  Yes Hillis Range, MD  mirtazapine (REMERON) 7.5 MG tablet Take 1 tablet (7.5 mg total) by mouth at bedtime. 05/11/13  Yes Alison Murray, MD  Multiple Vitamins-Minerals (PRESERVISION AREDS) CAPS  Take 1 capsule by mouth 2 (two) times daily.   Yes Historical Provider, MD  nitrofurantoin, macrocrystal-monohydrate, (MACROBID) 100 MG capsule Take 100 mg by mouth 2 (two) times daily. For 7 days 02/23/14  Yes Historical Provider, MD  nitroGLYCERIN (NITRODUR - DOSED IN MG/24 HR) 0.4 mg/hr Place 1 patch onto the skin every evening. Nitrate free interval from 4pm - 8pm every day   Yes Historical Provider, MD  nitroGLYCERIN (NITROSTAT) 0.4 MG SL tablet Place 0.4 mg under the tongue every 5 (five) minutes as needed for chest pain (up to 3 doses).    Yes Historical Provider, MD  Omega-3 Fatty Acids (FISH OIL) 1000 MG CAPS Take 1,000 mg by mouth 2 (two) times daily.   Yes Historical Provider, MD  oxybutynin (DITROPAN-XL) 10 MG 24 hr tablet Take 10 mg by mouth at bedtime.   Yes Historical Provider, MD  pantoprazole (PROTONIX) 40 MG tablet Take 40 mg by mouth 2 (two) times daily.   Yes Historical Provider, MD  polyethylene glycol (MIRALAX / GLYCOLAX) packet Take 17 g by mouth daily as needed for moderate constipation.    Yes Historical Provider, MD  potassium chloride (K-DUR,KLOR-CON) 10 MEQ tablet Take 10 mEq by mouth daily.   Yes Historical Provider, MD  predniSONE (DELTASONE) 10 MG tablet Take 10-60 mg by mouth daily with breakfast. Take 60 mg by mouth on day 1, take 50 mg by mouth on day 2, take 40 mg by mouth on day 3, take 30 mg by mouth on day 4, take 20 mg by mouth on day 5, then take 10 mg by mouth on day 6. 02/26/14  Yes Historical Provider, MD  warfarin (COUMADIN) 4 MG tablet Take 4 mg by mouth daily.   Yes Historical Provider, MD    Allergies:   Allergies  Allergen Reactions  . Ampicillin Other (See Comments)    Reaction Unknown   . Ciprofloxin Hcl [Ciprofloxacin Hcl] Other (See Comments)    Reaction Unknown, has received Levaquin 12/14  . Demerol Other (See Comments)    Reaction Unknown   . Sulfa Antibiotics Rash    Social History:  Ambulatory   [place]  From [facility]  [place] [facility]   reports that she quit smoking about 17 years ago. She has never used smokeless tobacco. She reports that she does not drink alcohol or use illicit drugs.    Family History: family history  includes Alzheimer's disease in her brother; Brain cancer in her sister; CAD in her son; CVA in her mother; Heart attack in her father; Lymphoma in her son; Testicular cancer in her brother.    Physical Exam: Patient Vitals for the past 24 hrs:  BP Temp Temp src Pulse Resp SpO2  02/28/14 2058 117/61 mmHg - - - - -  02/28/14 2000 136/64 mmHg - - 60 - 92 %  02/28/14 1959 132/64 mmHg - - 59 - 94 %  02/28/14 1930 132/64 mmHg - - 59 - 93 %  02/28/14 1900 137/64 mmHg - - 59 - 93 %  02/28/14 1830 118/70 mmHg - - 60 - 91 %  02/28/14 1819 134/79 mmHg - - 60 18 93 %  02/28/14 1800 134/72 mmHg - - 60 - 94 %  02/28/14 1730 147/73 mmHg - - 60 - 92 %  02/28/14 1700 142/67 mmHg - - 59 - 94 %  02/28/14 1651 137/80 mmHg - - 64 18 95 %  02/28/14 1600 128/65 mmHg - - 59 - 93 %  02/28/14 1530 162/75 mmHg - - 60 - 94 %  02/28/14 1430 175/79 mmHg - - 64 - 95 %  02/28/14 1315 154/75 mmHg - - 60 - 95 %  02/28/14 1303 - - - 59 - 95 %  02/28/14 1302 140/79 mmHg - - - - -  02/28/14 1145 150/74 mmHg - - 59 20 96 %  02/28/14 1115 157/76 mmHg - - 59 21 97 %  02/28/14 1030 153/74 mmHg - - 60 27 96 %  02/28/14 0935 151/72 mmHg 98.8 F (37.1 C) Oral 60 15 97 %    1. General:  in No Acute distress 2. Psychological: Alert and  Oriented 3. Head/ENT:    Dry Mucous Membranes                          Head Non traumatic, neck supple                          Normal  Dentition 4. SKIN:  decreased Skin turgor,  Skin clean Dry and intact no rash 5. Heart: Regular rate and rhythm no Murmur, Rub or gallop 6. Lungs: Clear to auscultation bilaterally, no wheezes or crackles   7. Abdomen: Soft, mildly tender tender LLQ, slightly distended 8. Lower extremities: no clubbing, cyanosis, 2+ edema L>R chronic 9.  Neurologically Grossly intact, moving all 4 extremities equally 10. MSK: Normal range of motion  body mass index is unknown because there is no weight on file.   Labs on Admission:   Recent Labs  02/28/14 1127  NA 142  K 3.9  CL 103  CO2 28  GLUCOSE 100*  BUN 14  CREATININE 0.65  CALCIUM 9.7    Recent Labs  02/28/14 1802  AST 23  ALT 21  ALKPHOS 78  BILITOT 0.4  PROT 6.7  ALBUMIN 3.0*   No results found for this basename: LIPASE, AMYLASE,  in the last 72 hours  Recent Labs  02/28/14 1127  WBC 6.4  NEUTROABS 4.2  HGB 11.0*  HCT 34.4*  MCV 93.2  PLT 195   No results found for this basename: CKTOTAL, CKMB, CKMBINDEX, TROPONINI,  in the last 72 hours No results found for this basename: TSH, T4TOTAL, FREET3, T3FREE, THYROIDAB,  in the last 72 hours No results found for this basename: VITAMINB12, FOLATE, FERRITIN, TIBC, IRON, RETICCTPCT,  in the last 72 hours No results found for this basename: HGBA1C    The CrCl is unknown because both a height and weight (above a minimum accepted value) are required for this calculation. ABG    Component Value Date/Time   HCO3 24.1* 07/04/2007 1653   TCO2 25 07/04/2007 1653   ACIDBASEDEF 1.0 06/15/2007 1132     Lab Results  Component Value Date   DDIMER 0.33 11/19/2013     Other results:   UA mild UTI  BNP (last 3 results) No results found for this basename: PROBNP,  in the last 8760 hours  There were no vitals filed for this visit.   Cultures:    Component Value Date/Time   SDES BLOOD RIGHT HAND 05/08/2013 1140   SPECREQUEST BOTTLES DRAWN AEROBIC AND ANAEROBIC 5CC 05/08/2013 1140   CULT  Value: NO GROWTH 5 DAYS Performed at [facility] 05/08/2013 1140   REPTSTATUS 05/14/2013 FINAL 05/08/2013 1140      Radiological Exams on Admission: Ct Abdomen Pelvis W Contrast  02/28/2014   CLINICAL DATA:  Back pain, abdominal pain  EXAM: CT ABDOMEN AND PELVIS WITH CONTRAST  TECHNIQUE: Multidetector CT imaging of  the abdomen and pelvis was performed using the standard protocol following bolus administration of intravenous contrast.  CONTRAST:  OMNIPAQUE IOHEXOL 300 MG/ML  SOLN  COMPARISON:  11/01/2011  FINDINGS: There is a small left pleural effusion. There is bibasilar airspace disease.  There are stable right and left hepatic lobe low-density lesions likely representing small cysts or hamartomas. There is gallbladder distention. There is intrahepatic and extrahepatic biliary ductal dilatation. The common bile duct measures 17 mm in diameter. The spleen demonstrates no focal abnormality.There is stable bilateral renal cysts. There is a nonobstructing left renal calculus. The adrenal glands are normal. The pancreas is atrophic without focal abnormality. The bladder is unremarkable.  The stomach, duodenum, small intestine, and large intestine demonstrate no contrast extravasation or dilatation. There is a large amount of stool throughout the colon. There is a small bowel containing right inguinal hernia. There is no pneumoperitoneum, pneumatosis, or portal venous gas. There is no abdominal or pelvic free fluid. There is no lymphadenopathy.  The abdominal aorta is normal in caliber with atherosclerosis.  There are no lytic or sclerotic osseous lesions. There is diffuse lumbar spine spondylosis. Severe osteoarthritis of the right hip.  IMPRESSION: 1. Large amount of stool throughout the colon. 2. Distended gallbladder with intrahepatic and extrahepatic biliary ductal dilatation. No obstructing mass is identified. 3. Small left pleural effusion. Bibasilar airspace disease likely representing atelectasis versus developing infection. 4. Lumbar spine spondylosis. 5. Abdominal aortic atherosclerosis. 6. Right inguinal hernia containing nondilated small bowel.   Electronically Signed   By: Elige Ko   On: 02/28/2014 17:05   Dg Abd Acute W/chest  02/28/2014   CLINICAL DATA:  Worsening low back pain for the past week, not  improved with a back injection.  EXAM: ACUTE ABDOMEN SERIES (ABDOMEN 2 VIEW & CHEST 1 VIEW)  COMPARISON:  Chest dated 11/19/2013  FINDINGS: Grossly stable enlarged cardiac silhouette and prominent interstitial markings. Interval mild bibasilar atelectasis. Stable left subclavian pacemaker leads. Diffuse osteopenia and marked bilateral shoulder degenerative changes. Upper abdominal surgical clips. Normal bowel gas pattern without free peritoneal air. Prominent stool in the colon. Marked lumbar spine degenerative changes and lesser lower thoracic spine degenerative changes including changes of DISH. Right hip fixation screws.  IMPRESSION: 1. Mild bibasilar atelectasis. 2. Stable cardiomegaly and chronic interstitial lung  disease. 3. Prominent stool. 4. Marked bilateral shoulder and lumbar spine degenerative changes.   Electronically Signed   By: Gordan PaymentSteve  Reid M.D.   On: 02/28/2014 12:44    Chart has been reviewed  Assessment/Plan  80 yo F with hx of A.fib on chronic coumadin, CAD, tachy-brady syndrome sp pacemaker and chronic back pain due to stenosis here with worsening back pain and abdominal pain with evidence of obstipation and UTI was incidentally found to have biliary duct dilation and and enlarged gallbladder with norm LFT's and no clinical evidence of cholecystitis.  Present on Admission:  Abdominal pain -  likely multifactorial combination of UTI and obstipation. No RUQ pain noted.  . Coronary atherosclerosis - stable continue home medications . Essential hypertension - patient have been recently hypotensive will continue to monitor . Chronic diastolic CHF (congestive heart failure) - stable currently somewhat fluid down . UTI (lower urinary tract infection) - rocephin, await results of urine culture A.fib - chronic hold coumadin untill know if needs intervention regarding enlarged gall bladder.  Biliary dilatation - US did not show reason for biliary dilation, surgery is aware ER spoke to Dr.  Donell BeersByerly and will come to see the patient, MRCP not a option given pacemaker, Gi consult in AM for further recommendations.  Severe obstipation - bowel regimen  Prophylaxis: coumadin, Protonix  CODE STATUS:  DNR/DNI according  Other plan as per orders.  I have spent a total of 55 min on this admission  Xianna Siverling 02/28/2014, 9:07 PM  [facility]  Pager [phone]   after 2 AM please page floor coverage PA If 7AM-7PM, please contact the day team taking care of the patient  [email or web address]  Password [email or web address]

## 2014-02-28 NOTE — ED Notes (Signed)
Called CT to inform that patient is done with contrast 

## 2014-02-28 NOTE — ED Provider Notes (Signed)
CSN: [id number]     Arrival date & time 02/28/14  [id number] History   First MD Initiated Contact with Patient 02/28/14 [phone]     Chief Complaint  Patient presents with  . Back Pain     (Consider location/radiation/quality/duration/timing/severity/associated sxs/prior Treatment) Patient is a 80 y.o. female presenting with back pain.  Back Pain Location:  Lumbar spine Quality:  Shooting Radiates to: right leg. Pain severity:  Severe Onset quality:  Gradual Duration: acute on chronic.  worse for past two days. Timing:  Constant Progression:  Worsening Chronicity:  Chronic Context comment:  Started on prednisone by her orthopedist 2 days ago Relieved by: in the past, epidural injections have helped.   Worsened by:  Ambulation and movement Ineffective treatments: prednisone. Associated symptoms: bladder incontinence (chronic) and fever ("low grade", pt unsure of tMax)   Associated symptoms: no abdominal pain, no numbness and no tingling   Associated symptoms comment:  Constipation.  Last BM 5 days ago.   Past Medical History  Diagnosis Date  . Transient ischemic attack (TIA) 1994  . Atrial fibrillation   . Hypertension   . Dyslipidemia   . Gastritis   . Vitamin B12 deficiency   . Peptic ulcer disease   . Osteopenia   . Macular degeneration, right eye   . Arthritis     back, shoulders  . Cough 05/21/2011    pt states cough x 2 days  . Edema     left leg  . Hypoglycemia   . Urinary incontinence   . CAD 06/15/2009  . Sinoatrial node dysfunction 06/15/2009    Dual-chamber PM inserted 03/18/09 St. Jude Zephyr XL DR dual-chamber, seriel [id number].  . Tachy-brady syndrome     Dual-chamber PM inserted 03/18/09 St. Jude Zephyr XL DR dual-chamber, seriel [id number].  Marland Kitchen. CAD (coronary artery disease)     With LAD & RCA BMS, 2008 & LAD DES 2009 for restenosis. Both vessels patent by cath 03/18/09  . Atrial flutter   . Left heart failure   . Dyspnea     frequent  . Rectal bleeding    Aggravated by a combo of coumadin & aspirin. D/C'ed aspirin.  . Chronic diastolic congestive heart failure   . Asthma with COPD   . GERD (gastroesophageal reflux disease)   . Irritable bowel   . Hip fracture, right   . Anorexia 08/27/12    Nearly 20 lbs weight loss over past year. Anorexia weight loss or major problems now & likely represent a slow progression/worsening of the terminal illness, perhaps GI. Probably time to start considering a change in care plan from aggresive/diagnostic to palliative (Dr. Verdis PrimeHenry Smith 08/17/12)  . Coronary atherosclerosis of native coronary artery   . Chest discomfort 08/27/12    Nearly continuous.  . Poor appetite    Past Surgical History  Procedure Laterality Date  . Perforated ulcer    . Eye surgery      bilateral  . Pacemaker insertion  03/18/09    For symptomatic brady-tachy syndrome. St. Jude Zephyr XL DR dual-chamber, seriel [id number]  . Hip surgery      broke hip and has pin in it  . Drug-eluting stent  [id number]    LAD & RCA BMS in 2008 & LAD DES in 2009 for restenosis. Both vessels patent by cath 03/18/09.  . Cardiac catheterization  03/18/09    With LAD & RCA BMS in 2008 & LAD DES in 2009 for restenosis.  . Varicose vein surgery    .  Orif hip fracture Right     Pin in hip  . Cataract extraction Bilateral    Family History  Problem Relation Age of Onset  . CVA Mother   . Brain cancer Sister   . Heart attack Father   . Lymphoma Son   . CAD Son   . Testicular cancer Brother   . Alzheimer's disease Brother    History  Substance Use Topics  . Smoking status: Former Smoker    Quit date: 03/14/1996  . Smokeless tobacco: Never Used  . Alcohol Use: No   OB History   Grav Para Term Preterm Abortions TAB SAB Ect Mult Living                 Review of Systems  Constitutional: Positive for fever ("low grade", pt unsure of tMax).  Gastrointestinal: Negative for abdominal pain.  Genitourinary: Positive for bladder incontinence (chronic).    Musculoskeletal: Positive for back pain.  Neurological: Negative for tingling and numbness.  All other systems reviewed and are negative.     Allergies  Ampicillin; Ciprofloxin hcl; Demerol; and Sulfa antibiotics  Home Medications   Prior to Admission medications   Medication Sig Start Date End Date Taking? Authorizing Provider  acetaminophen (TYLENOL) 650 MG CR tablet Take 650 mg by mouth every 4 (four) hours as needed for pain or fever.    Yes Historical Provider, MD  albuterol (PROVENTIL) (5 MG/ML) 0.5% nebulizer solution Take 0.5 mLs (2.5 mg total) by nebulization every 6 (six) hours as needed for wheezing or shortness of breath. 05/11/13  Yes Alison Murray, MD  albuterol (VENTOLIN HFA) 108 (90 BASE) MCG/ACT inhaler Inhale 2 puffs into the lungs every 4 (four) hours as needed for wheezing or shortness of breath. Wait 1 minute between each puff.   Yes Historical Provider, MD  aluminum & magnesium hydroxide-simethicone (MYLANTA) 500-450-40 MG/5ML suspension Take 30 mLs by mouth every 4 (four) hours as needed for indigestion.   Yes Historical Provider, MD  atorvastatin (LIPITOR) 10 MG tablet Take 10 mg by mouth every evening.    Yes Historical Provider, MD  bisacodyl (DULCOLAX) 10 MG suppository Place 10 mg rectally as needed for moderate constipation.   Yes Historical Provider, MD  Buprenorphine (BUTRANS) 7.5 MCG/HR PTWK Place 7.5 mcg onto the skin once a week.   Yes Historical Provider, MD  calcium carbonate (OS-CAL) 600 MG TABS Take 600 mg by mouth 2 (two) times daily.     Yes Historical Provider, MD  Cholecalciferol (VITAMIN D3) 1000 UNITS CAPS Take 1,000 Units by mouth 2 (two) times daily.    Yes Historical Provider, MD  Cyanocobalamin (VITAMIN B-12) 1000 MCG SUBL Place 1 tablet under the tongue daily.   Yes Historical Provider, MD  docusate sodium (COLACE) 100 MG capsule Take 200 mg by mouth daily.   Yes Historical Provider, MD  Fluticasone-Salmeterol (ADVAIR DISKUS) 100-50 MCG/DOSE  AEPB Inhale 1 puff into the lungs 2 (two) times daily. Rinse mouth after use.   Yes Historical Provider, MD  furosemide (LASIX) 40 MG tablet Take 40 mg by mouth daily.   Yes Historical Provider, MD  guaiFENesin (MUCINEX) 600 MG 12 hr tablet Take 600 mg by mouth 2 (two) times daily.   Yes Historical Provider, MD  HYDROcodone-acetaminophen (NORCO/VICODIN) 5-325 MG per tablet Take 1 tablet by mouth 4 (four) times daily as needed for moderate pain.   Yes Historical Provider, MD  magnesium hydroxide (MILK OF MAGNESIA) 400 MG/5ML suspension Take 30 mLs by mouth  daily as needed for mild constipation.   Yes Historical Provider, MD  metoprolol succinate (TOPROL-XL) 50 MG 24 hr tablet Take 1 tablet (50 mg total) by mouth daily. Take with or immediately following a meal. 04/29/13  Yes Hillis RangeJames Allred, MD  mirtazapine (REMERON) 7.5 MG tablet Take 1 tablet (7.5 mg total) by mouth at bedtime. 05/11/13  Yes Alison MurrayAlma M Devine, MD  Multiple Vitamins-Minerals (PRESERVISION AREDS) CAPS Take 1 capsule by mouth 2 (two) times daily.   Yes Historical Provider, MD  nitrofurantoin, macrocrystal-monohydrate, (MACROBID) 100 MG capsule Take 100 mg by mouth 2 (two) times daily. For 7 days 02/23/14  Yes Historical Provider, MD  nitroGLYCERIN (NITRODUR - DOSED IN MG/24 HR) 0.4 mg/hr Place 1 patch onto the skin every evening. Nitrate free interval from 4pm - 8pm every day   Yes Historical Provider, MD  nitroGLYCERIN (NITROSTAT) 0.4 MG SL tablet Place 0.4 mg under the tongue every 5 (five) minutes as needed for chest pain (up to 3 doses).    Yes Historical Provider, MD  Omega-3 Fatty Acids (FISH OIL) 1000 MG CAPS Take 1,000 mg by mouth 2 (two) times daily.   Yes Historical Provider, MD  oxybutynin (DITROPAN-XL) 10 MG 24 hr tablet Take 10 mg by mouth at bedtime.   Yes Historical Provider, MD  pantoprazole (PROTONIX) 40 MG tablet Take 40 mg by mouth 2 (two) times daily.   Yes Historical Provider, MD  polyethylene glycol (MIRALAX / GLYCOLAX)  packet Take 17 g by mouth daily as needed for moderate constipation.    Yes Historical Provider, MD  potassium chloride (K-DUR,KLOR-CON) 10 MEQ tablet Take 10 mEq by mouth daily.   Yes Historical Provider, MD  predniSONE (DELTASONE) 10 MG tablet Take 10-60 mg by mouth daily with breakfast. Take 60 mg by mouth on day 1, take 50 mg by mouth on day 2, take 40 mg by mouth on day 3, take 30 mg by mouth on day 4, take 20 mg by mouth on day 5, then take 10 mg by mouth on day 6. 02/26/14  Yes Historical Provider, MD  warfarin (COUMADIN) 4 MG tablet Take 4 mg by mouth daily.   Yes Historical Provider, MD   BP 151/72  Pulse 60  Temp(Src) 98.8 F (37.1 C) (Oral)  Resp 15  SpO2 97% Physical Exam  Nursing note and vitals reviewed. Constitutional: She is oriented to person, place, and time. She appears well-developed and well-nourished. No distress.  HENT:  Head: Normocephalic and atraumatic.  Mouth/Throat: Oropharynx is clear and moist.  Eyes: Conjunctivae are normal. Pupils are equal, round, and reactive to light. No scleral icterus.  Neck: Neck supple.  Cardiovascular: Normal rate, regular rhythm, normal heart sounds and intact distal pulses.   No murmur heard. Pulmonary/Chest: Effort normal and breath sounds normal. No stridor. No respiratory distress. She has no rales.  Abdominal: Soft. Bowel sounds are normal. She exhibits no distension. There is no tenderness.  Musculoskeletal: Normal range of motion.       Back:  Neurological: She is alert and oriented to person, place, and time.  Normal lower extremity strength, sensation, and pulses.  Skin: Skin is warm and dry. No rash noted.  Psychiatric: She has a normal mood and affect. Her behavior is normal.    ED Course  Procedures (including critical care time) Labs Review Labs Reviewed  CBC WITH DIFFERENTIAL  BASIC METABOLIC PANEL  URINALYSIS, ROUTINE W REFLEX MICROSCOPIC    Imaging Review No results found.   EKG  Interpretation None      MDM   Clinical Impression: 1. RUQ abdominal pain   2. Chronic atrial fibrillation   3. Essential hypertension   4. UTI (lower urinary tract infection)   5. Obstipation   6. Biliary disease with obstruction   7. Anemia of chronic disease   8. Chronic diastolic CHF (congestive heart failure)   Back pain   80 yo female presenting with back pain.  She describes the pain as an acute exacerbation of her chronic pain.  She also complains of constipation.  No abdominal pain or tenderness.  She has no apparent neurologic symptoms or signs, other than baseline urinary incontinence.  Of note, her aorta was normal caliber as recently as two years ago.  I suspect an acute exacerbation of her chronic pain, likely exacerbated by constipation.    On recheck, pt stated she has been having some abdominal pain and had right sided abdominal tenderness on exam.  Plan to obtain CT to further evaluate abdominal pain.  She also has evidence of possible UTI.  Due to worsening back pain, feel that she needs treatment with antibiotics.    Care transferred to Dr. Romeo Apple pending CT.  Pain better controlled after IV morphine.   Warnell Forester, MD 03/01/14 1254

## 2014-02-28 NOTE — ED Notes (Signed)
[facility] pt reports pain at her L4,5 that she normally gets and injection for, recently went to the injection but received prednisone dose instead. Reports that she is on day 2 and feeling worse. Bp-150/82 Hr-68 No recent injury to the area.

## 2014-02-28 NOTE — ED Notes (Signed)
Pt remains monitored by blood pressure and pulse ox. Pts family remains at bedside.  

## 2014-02-28 NOTE — ED Notes (Signed)
EDP at the bedside.  ?

## 2014-02-28 NOTE — ED Notes (Signed)
Lab at the bedside 

## 2014-02-28 NOTE — ED Notes (Signed)
Admitting at bedside with family.

## 2014-02-28 NOTE — ED Provider Notes (Signed)
Accepted care from Dr. Loretha StaplerWofford. Awaiting CT imaging of abdomen.   The patient was found to have a distended gallbladder with intrahepatic and extrahepatic biliary ductal dilation. Did not have a hepatic function panel to correlate so I had to order this before I could consult general surgery.  7:41 PM general surgery consulted and plan to evaluate the patient. Will plan on admission to the hospitalist service for pain control and further workup.   Clinical Impression 1. RUQ abdominal pain   2. Chronic atrial fibrillation   3. Essential hypertension   4. UTI (lower urinary tract infection)   5. Obstipation   6. Biliary disease with obstruction      Leslie SheffieldForrest Jaquay Morneault, MD 03/01/14 0003

## 2014-02-28 NOTE — ED Notes (Signed)
Report given to Ray, RN

## 2014-02-28 NOTE — ED Notes (Signed)
Myself and Lillia AbedLindsay, RN changed the chuks underneath the pt again along with placing on a clean gown; family at bedside; pt given another warm blanket

## 2014-02-28 NOTE — ED Notes (Signed)
Myself and Lillia AbedLindsay, RN cleaned pt and changed stretcher linens; placed clean dry gown on pt and placed several chuks underneath pt; pt given 2 warm blankets and readjusted on stretcher; family at bedside

## 2014-02-28 NOTE — ED Notes (Signed)
Cleaned pt of soiled linen. Pt placed on monitor upon return to room from radiology.

## 2014-02-28 NOTE — ED Notes (Signed)
Family at bedside. 

## 2014-02-28 NOTE — ED Notes (Signed)
Patient transported to CT 

## 2014-02-28 NOTE — ED Notes (Signed)
Patient transported to Ultrasound 

## 2014-03-01 ENCOUNTER — Inpatient Hospital Stay (HOSPITAL_COMMUNITY): Payer: Medicare Other

## 2014-03-01 DIAGNOSIS — R1011 Right upper quadrant pain: Secondary | ICD-10-CM

## 2014-03-01 DIAGNOSIS — D638 Anemia in other chronic diseases classified elsewhere: Secondary | ICD-10-CM

## 2014-03-01 DIAGNOSIS — I5032 Chronic diastolic (congestive) heart failure: Secondary | ICD-10-CM

## 2014-03-01 LAB — COMPREHENSIVE METABOLIC PANEL
ALT: 20 U/L (ref 0–35)
AST: 22 U/L (ref 0–37)
Albumin: 2.8 g/dL — ABNORMAL LOW (ref 3.5–5.2)
Alkaline Phosphatase: 78 U/L (ref 39–117)
Anion gap: 13 (ref 5–15)
BILIRUBIN TOTAL: 0.4 mg/dL (ref 0.3–1.2)
BUN: 13 mg/dL (ref 6–23)
CALCIUM: 9.1 mg/dL (ref 8.4–10.5)
CHLORIDE: 106 meq/L (ref 96–112)
CO2: 25 meq/L (ref 19–32)
CREATININE: 0.67 mg/dL (ref 0.50–1.10)
GFR, EST AFRICAN AMERICAN: 83 mL/min — AB (ref >90)
GFR, EST NON AFRICAN AMERICAN: 72 mL/min — AB (ref >90)
GLUCOSE: 79 mg/dL (ref 70–99)
Potassium: 3.8 mEq/L (ref 3.7–5.3)
Sodium: 144 mEq/L (ref 137–147)
Total Protein: 6.6 g/dL (ref 6.0–8.3)

## 2014-03-01 LAB — CBC
HCT: 35 % — ABNORMAL LOW (ref 36.0–46.0)
Hemoglobin: 11.2 g/dL — ABNORMAL LOW (ref 12.0–15.0)
MCH: 30 pg (ref 26.0–34.0)
MCHC: 32 g/dL (ref 30.0–36.0)
MCV: 93.8 fL (ref 78.0–100.0)
PLATELETS: 215 10*3/uL (ref 150–400)
RBC: 3.73 MIL/uL — ABNORMAL LOW (ref 3.87–5.11)
RDW: 13.5 % (ref 11.5–15.5)
WBC: 7.3 10*3/uL (ref 4.0–10.5)

## 2014-03-01 LAB — MAGNESIUM: Magnesium: 2.2 mg/dL (ref 1.5–2.5)

## 2014-03-01 LAB — PROTIME-INR
INR: 2.77 — ABNORMAL HIGH (ref 0.00–1.49)
PROTHROMBIN TIME: 29.5 s — AB (ref 11.6–15.2)

## 2014-03-01 LAB — TSH: TSH: 6.68 u[IU]/mL — AB (ref 0.350–4.500)

## 2014-03-01 LAB — PHOSPHORUS: Phosphorus: 3.1 mg/dL (ref 2.3–4.6)

## 2014-03-01 MED ORDER — WARFARIN SODIUM 4 MG PO TABS
4.0000 mg | ORAL_TABLET | Freq: Every day | ORAL | Status: DC
  Administered 2014-03-01 – 2014-03-03 (×3): 4 mg via ORAL
  Filled 2014-03-01 (×4): qty 1

## 2014-03-01 MED ORDER — SORBITOL 70 % SOLN
30.0000 mL | Freq: Two times a day (BID) | Status: AC
  Administered 2014-03-01 (×2): 30 mL via ORAL
  Filled 2014-03-01 (×2): qty 30

## 2014-03-01 MED ORDER — SORBITOL 70 % SOLN
30.0000 mL | Freq: Every day | Status: DC | PRN
  Filled 2014-03-01 (×2): qty 30

## 2014-03-01 MED ORDER — WARFARIN - PHARMACIST DOSING INPATIENT: Freq: Every day | Status: DC

## 2014-03-01 MED ORDER — PANTOPRAZOLE SODIUM 40 MG PO TBEC
40.0000 mg | DELAYED_RELEASE_TABLET | Freq: Two times a day (BID) | ORAL | Status: DC
  Administered 2014-03-01 – 2014-03-04 (×7): 40 mg via ORAL
  Filled 2014-03-01 (×7): qty 1

## 2014-03-01 MED ORDER — BUPRENORPHINE 7.5 MCG/HR TD PTWK
7.5000 ug | MEDICATED_PATCH | TRANSDERMAL | Status: DC
Start: 2014-03-01 — End: 2014-03-03

## 2014-03-01 NOTE — Progress Notes (Signed)
[facility] PROGRESS NOTE  Assessment/Plan: RUQ abdominal pain - Dilated duct on US, Murphy sign negative. No RUQ pain, her pain is more diffuse. LFT's within normal. - ? If due to constipation, start miralax, sorbitol and tap water enema. - Last bowel movement 6 days ago. - Started on empiric antibiotics for UTI.  UTI (lower urinary tract infection) - no leukocytosis, no supra pubic tenderness, no urgency. - hold antibiotics.  A-fib: - resume coumadin. - rate controlled.  Chronic diastolic CHF (congestive heart failure): - KVO IV fluids has mild JVD. - allow full liq. diet.  Essential hypertension - stable.    Code Status: DNR/DNI Family Communication: none  Disposition Plan: inpatient   Consultants:  Surgery  Procedures:  Ct abd  Antibiotics:  Rocephin for 1 day now stopped  HPI/Subjective: Relates diffuse mild abdominal pain. - no BM in almost a week.  Objective: Filed Vitals:   02/28/14 2058 02/28/14 2224 03/01/14 0145 03/01/14 0521  BP: 117/61 143/65 105/68 110/70  Pulse:  59 60 72  Temp:  99.4 F (37.4 C) 98.2 F (36.8 C) 98.2 F (36.8 C)  TempSrc:  Oral Oral Oral  Resp:  20 17 18   Height:  5\' 1"  (1.549 m)    Weight:  50.44 kg (111 lb 3.2 oz)  50.8 kg (111 lb 15.9 oz)  SpO2:  93% 93% 97%   No intake or output data in the 24 hours ending 03/01/14 0936 Filed Weights   02/28/14 2224 03/01/14 0521  Weight: 50.44 kg (111 lb 3.2 oz) 50.8 kg (111 lb 15.9 oz)    Exam:  General: Alert, awake, oriented x3, in no acute distress.  HEENT: No bruits, no goiter. +JVD Heart: Regular rate and rhythm . Lungs: Good air movement, clear  Abdomen: Soft, diffuse tender, distended, positive bowel sounds.    Data Reviewed: Basic Metabolic Panel:  Recent Labs Lab 02/28/14 1127 03/01/14 0420  NA 142 144  K 3.9 3.8  CL 103 106  CO2 28 25  GLUCOSE 100* 79  BUN 14 13  CREATININE 0.65 0.67  CALCIUM 9.7 9.1  MG  --  2.2  PHOS  --  3.1    Liver Function Tests:  Recent Labs Lab 02/28/14 1802 03/01/14 0420  AST 23 22  ALT 21 20  ALKPHOS 78 78  BILITOT 0.4 0.4  PROT 6.7 6.6  ALBUMIN 3.0* 2.8*   No results found for this basename: LIPASE, AMYLASE,  in the last 168 hours No results found for this basename: AMMONIA,  in the last 168 hours CBC:  Recent Labs Lab 02/28/14 1127 03/01/14 0420  WBC 6.4 7.3  NEUTROABS 4.2  --   HGB 11.0* 11.2*  HCT 34.4* 35.0*  MCV 93.2 93.8  PLT 195 215   Cardiac Enzymes: No results found for this basename: CKTOTAL, CKMB, CKMBINDEX, TROPONINI,  in the last 168 hours BNP (last 3 results) No results found for this basename: PROBNP,  in the last 8760 hours CBG: No results found for this basename: GLUCAP,  in the last 168 hours  No results found for this or any previous visit (from the past 240 hour(s)).   Studies: Ct Abdomen Pelvis W Contrast  02/28/2014   CLINICAL DATA:  Back pain, abdominal pain  EXAM: CT ABDOMEN AND PELVIS WITH CONTRAST  TECHNIQUE: Multidetector CT imaging of the abdomen and pelvis was performed using the standard protocol following bolus administration of intravenous contrast.  CONTRAST:  100mL OMNIPAQUE IOHEXOL 300 MG/ML  SOLN  COMPARISON:  11/01/2011  FINDINGS: There is a small left pleural effusion. There is bibasilar airspace disease.  There are stable right and left hepatic lobe low-density lesions likely representing small cysts or hamartomas. There is gallbladder distention. There is intrahepatic and extrahepatic biliary ductal dilatation. The common bile duct measures 17 mm in diameter. The spleen demonstrates no focal abnormality.There is stable bilateral renal cysts. There is a nonobstructing left renal calculus. The adrenal glands are normal. The pancreas is atrophic without focal abnormality. The bladder is unremarkable.  The stomach, duodenum, small intestine, and large intestine demonstrate no contrast extravasation or dilatation. There is a large amount  of stool throughout the colon. There is a small bowel containing right inguinal hernia. There is no pneumoperitoneum, pneumatosis, or portal venous gas. There is no abdominal or pelvic free fluid. There is no lymphadenopathy.  The abdominal aorta is normal in caliber with atherosclerosis.  There are no lytic or sclerotic osseous lesions. There is diffuse lumbar spine spondylosis. Severe osteoarthritis of the right hip.  IMPRESSION: 1. Large amount of stool throughout the colon. 2. Distended gallbladder with intrahepatic and extrahepatic biliary ductal dilatation. No obstructing mass is identified. 3. Small left pleural effusion. Bibasilar airspace disease likely representing atelectasis versus developing infection. 4. Lumbar spine spondylosis. 5. Abdominal aortic atherosclerosis. 6. Right inguinal hernia containing nondilated small bowel.   Electronically Signed   By: Elige Ko   On: 02/28/2014 17:05   Dg Abd Acute W/chest  02/28/2014   CLINICAL DATA:  Worsening low back pain for the past week, not improved with a back injection.  EXAM: ACUTE ABDOMEN SERIES (ABDOMEN 2 VIEW & CHEST 1 VIEW)  COMPARISON:  Chest dated 11/19/2013  FINDINGS: Grossly stable enlarged cardiac silhouette and prominent interstitial markings. Interval mild bibasilar atelectasis. Stable left subclavian pacemaker leads. Diffuse osteopenia and marked bilateral shoulder degenerative changes. Upper abdominal surgical clips. Normal bowel gas pattern without free peritoneal air. Prominent stool in the colon. Marked lumbar spine degenerative changes and lesser lower thoracic spine degenerative changes including changes of DISH. Right hip fixation screws.  IMPRESSION: 1. Mild bibasilar atelectasis. 2. Stable cardiomegaly and chronic interstitial lung disease. 3. Prominent stool. 4. Marked bilateral shoulder and lumbar spine degenerative changes.   Electronically Signed   By: Gordan Payment M.D.   On: 02/28/2014 12:44   US Abdomen Limited  Ruq  02/28/2014   CLINICAL DATA:  Right upper quadrant pain. Ascites. Previous history of perforated ulcer.  EXAM: US ABDOMEN LIMITED - RIGHT UPPER QUADRANT  COMPARISON:  CT abdomen and pelvis 02/28/2014  FINDINGS: Gallbladder:  The gallbladder is mildly distended with evidence of sludge layering in the gallbladder. No discrete stones. No wall thickening. Murphy's sign is negative.  Common bile duct:  Diameter: 16 mm. Moderate intrahepatic and extrahepatic bile duct dilatation is noted. No intraductal stone is demonstrated although the distal common bile duct is obscured by bowel gas.  Liver:  No focal lesion identified. Within normal limits in parenchymal echogenicity.  IMPRESSION: Moderate intra and extrahepatic bile duct dilatation. Cause is not identified. Mild gallbladder distention and sludge. No stones or inflammatory changes suggested in the gallbladder.   Electronically Signed   By: Burman Nieves M.D.   On: 02/28/2014 22:38    Scheduled Meds: . atorvastatin  10 mg Oral QPM  . cefTRIAXone (ROCEPHIN) IVPB 1 gram/50 mL D5W  1 g Intravenous Q24H  . docusate sodium  200 mg Oral Daily  . furosemide  40 mg Oral Daily  .  guaiFENesin  600 mg Oral BID  . metoprolol succinate  50 mg Oral Daily  . mirtazapine  7.5 mg Oral QHS  . mometasone-formoterol  2 puff Inhalation BID  . oxybutynin  10 mg Oral QHS  . polyethylene glycol  17 g Oral BID  . potassium chloride  10 mEq Oral Daily   Continuous Infusions:    Marinda ElkFELIZ [name], [name]  [facility] Pager [phone]. If 8PM-8AM, please contact night-coverage at [email or web address], password [email or web address] 03/01/2014, 9:36 AM  LOS: 1 day

## 2014-03-01 NOTE — Consult Note (Signed)
I have seen and examined the patient and agree with the assessment and plans. Discussed with Dr. Donell BeersByerly as well who agrees with plan  Riley Lamouglas A. Magnus IvanBlackman  MD, FACS

## 2014-03-01 NOTE — Consult Note (Signed)
Reason for Consult: gallbladder distention, biliary ductal dilitation Referring Physician: Yasmene [name] is an 80 y.o. female.  HPI:  Pt is a 80 yo F who presented with complaints of worsening back pain.  Through a long story with the EDP, she seemed to have abdominal tenderness and a CT was ordered.  She was found to have intra and extrahepatic biliary ductal dilitation and a distended gallbladder.  She also had significant stool burden and a UTI.  She has no history of weight loss.  The patient denies any true abdominal pain, she just feels bloated and uncomfortable because she has not had a BM in 6 days.  She denies any issues with eating.  She has had no recent problems with her abdomen before this admission, just chronic back pain that she takes some pain meds for at home.  We have been asked to see her for evaluation of her ductal dilatation.  Past Medical History  Diagnosis Date  . Transient ischemic attack (TIA) 1994  . Atrial fibrillation   . Hypertension   . Dyslipidemia   . Gastritis   . Vitamin B12 deficiency   . Peptic ulcer disease   . Osteopenia   . Macular degeneration, right eye   . Arthritis     back, shoulders  . Cough 05/21/2011    pt states cough x 2 days  . Edema     left leg  . Hypoglycemia   . Urinary incontinence   . CAD 06/15/2009  . Sinoatrial node dysfunction 06/15/2009    Dual-chamber PM inserted 03/18/09 St. Jude Zephyr XL DR dual-chamber, seriel [id number].  . Tachy-brady syndrome     Dual-chamber PM inserted 03/18/09 St. Jude Zephyr XL DR dual-chamber, seriel [id number].  Marland Kitchen CAD (coronary artery disease)     With LAD & RCA BMS, 2008 & LAD DES 2009 for restenosis. Both vessels patent by cath 03/18/09  . Atrial flutter   . Left heart failure   . Dyspnea     frequent  . Rectal bleeding     Aggravated by a combo of coumadin & aspirin. D/C'ed aspirin.  . Chronic diastolic congestive heart failure   . Asthma with COPD   . GERD (gastroesophageal  reflux disease)   . Irritable bowel   . Hip fracture, right   . Anorexia 08/27/12    Nearly 20 lbs weight loss over past year. Anorexia weight loss or major problems now & likely represent a slow progression/worsening of the terminal illness, perhaps GI. Probably time to start considering a change in care plan from aggresive/diagnostic to palliative (Dr. Daneen Schick 08/17/12)  . Coronary atherosclerosis of native coronary artery   . Chest discomfort 08/27/12    Nearly continuous.  . Poor appetite     Past Surgical History  Procedure Laterality Date  . Perforated ulcer    . Eye surgery      bilateral  . Pacemaker insertion  03/18/09    For symptomatic brady-tachy syndrome. [place] dual-chamber, seriel [id number]  . Hip surgery      broke hip and has pin in it  . Drug-eluting stent  [id number]    LAD & RCA BMS in 2008 & LAD DES in 2009 for restenosis. Both vessels patent by cath 03/18/09.  . Cardiac catheterization  03/18/09    With LAD & RCA BMS in 2008 & LAD DES in 2009 for restenosis.  . Varicose vein surgery    . Orif hip fracture  Right     Pin in hip  . Cataract extraction Bilateral     Family History  Problem Relation Age of Onset  . CVA Mother   . Brain cancer Sister   . Heart attack Father   . Lymphoma Son   . CAD Son   . Testicular cancer Brother   . Alzheimer's disease Brother     Social History:  reports that she quit smoking about 17 years ago. She has never used smokeless tobacco. She reports that she does not drink alcohol or use illicit drugs.  Allergies:  Allergies  Allergen Reactions  . Ampicillin Other (See Comments)    Reaction Unknown   . Ciprofloxin Hcl [Ciprofloxacin Hcl] Other (See Comments)    Reaction Unknown, has received Levaquin 12/14  . Demerol Other (See Comments)    Reaction Unknown   . Sulfa Antibiotics Rash    Medications:  Prior to Admission:  Prescriptions prior to admission  Medication Sig Dispense Refill  .  acetaminophen (TYLENOL) 650 MG CR tablet Take 650 mg by mouth every 4 (four) hours as needed for pain or fever.       Marland Kitchen albuterol (PROVENTIL) (5 MG/ML) 0.5% nebulizer solution Take 0.5 mLs (2.5 mg total) by nebulization every 6 (six) hours as needed for wheezing or shortness of breath.  20 mL  12  . albuterol (VENTOLIN HFA) 108 (90 BASE) MCG/ACT inhaler Inhale 2 puffs into the lungs every 4 (four) hours as needed for wheezing or shortness of breath. Wait 1 minute between each puff.      Marland Kitchen aluminum & magnesium hydroxide-simethicone (MYLANTA) 500-450-40 MG/5ML suspension Take 30 mLs by mouth every 4 (four) hours as needed for indigestion.      Marland Kitchen atorvastatin (LIPITOR) 10 MG tablet Take 10 mg by mouth every evening.       . bisacodyl (DULCOLAX) 10 MG suppository Place 10 mg rectally as needed for moderate constipation.      . Buprenorphine (BUTRANS) 7.5 MCG/HR PTWK Place 7.5 mcg onto the skin once a week.      . calcium carbonate (OS-CAL) 600 MG TABS Take 600 mg by mouth 2 (two) times daily.        . Cholecalciferol (VITAMIN D3) 1000 UNITS CAPS Take 1,000 Units by mouth 2 (two) times daily.       . Cyanocobalamin (VITAMIN B-12) 1000 MCG SUBL Place 1 tablet under the tongue daily.      Marland Kitchen docusate sodium (COLACE) 100 MG capsule Take 200 mg by mouth daily.      . Fluticasone-Salmeterol (ADVAIR DISKUS) 100-50 MCG/DOSE AEPB Inhale 1 puff into the lungs 2 (two) times daily. Rinse mouth after use.      . furosemide (LASIX) 40 MG tablet Take 40 mg by mouth daily.      Marland Kitchen guaiFENesin (MUCINEX) 600 MG 12 hr tablet Take 600 mg by mouth 2 (two) times daily.      Marland Kitchen HYDROcodone-acetaminophen (NORCO/VICODIN) 5-325 MG per tablet Take 1 tablet by mouth 4 (four) times daily as needed for moderate pain.      . magnesium hydroxide (MILK OF MAGNESIA) 400 MG/5ML suspension Take 30 mLs by mouth daily as needed for mild constipation.      . metoprolol succinate (TOPROL-XL) 50 MG 24 hr tablet Take 1 tablet (50 mg total) by mouth  daily. Take with or immediately following a meal.      . mirtazapine (REMERON) 7.5 MG tablet Take 1 tablet (7.5 mg total) by mouth  at bedtime.  30 tablet  0  . Multiple Vitamins-Minerals (PRESERVISION AREDS) CAPS Take 1 capsule by mouth 2 (two) times daily.      . nitrofurantoin, macrocrystal-monohydrate, (MACROBID) 100 MG capsule Take 100 mg by mouth 2 (two) times daily. For 7 days      . nitroGLYCERIN (NITRODUR - DOSED IN MG/24 HR) 0.4 mg/hr Place 1 patch onto the skin every evening. Nitrate free interval from 4pm - 8pm every day      . nitroGLYCERIN (NITROSTAT) 0.4 MG SL tablet Place 0.4 mg under the tongue every 5 (five) minutes as needed for chest pain (up to 3 doses).       . Omega-3 Fatty Acids (FISH OIL) 1000 MG CAPS Take 1,000 mg by mouth 2 (two) times daily.      Marland Kitchen oxybutynin (DITROPAN-XL) 10 MG 24 hr tablet Take 10 mg by mouth at bedtime.      . pantoprazole (PROTONIX) 40 MG tablet Take 40 mg by mouth 2 (two) times daily.      . polyethylene glycol (MIRALAX / GLYCOLAX) packet Take 17 g by mouth daily as needed for moderate constipation.       . potassium chloride (K-DUR,KLOR-CON) 10 MEQ tablet Take 10 mEq by mouth daily.      . predniSONE (DELTASONE) 10 MG tablet Take 10-60 mg by mouth daily with breakfast. Take 60 mg by mouth on day 1, take 50 mg by mouth on day 2, take 40 mg by mouth on day 3, take 30 mg by mouth on day 4, take 20 mg by mouth on day 5, then take 10 mg by mouth on day 6.      . warfarin (COUMADIN) 4 MG tablet Take 4 mg by mouth daily.        Results for orders placed during the hospital encounter of 02/28/14 (from the past 48 hour(s))  CBC WITH DIFFERENTIAL     Status: Abnormal   Collection Time    02/28/14 11:27 AM      Result Value Ref Range   WBC 6.4  4.0 - 10.5 K/uL   RBC 3.69 (*) 3.87 - 5.11 MIL/uL   Hemoglobin 11.0 (*) 12.0 - 15.0 g/dL   HCT 86.9 (*) 22.9 - 40.8 %   MCV 93.2  78.0 - 100.0 fL   MCH 29.8  26.0 - 34.0 pg   MCHC 32.0  30.0 - 36.0 g/dL   RDW  23.6  69.8 - 02.8 %   Platelets 195  150 - 400 K/uL   Neutrophils Relative % 65  43 - 77 %   Neutro Abs 4.2  1.7 - 7.7 K/uL   Lymphocytes Relative 22  12 - 46 %   Lymphs Abs 1.4  0.7 - 4.0 K/uL   Monocytes Relative 12  3 - 12 %   Monocytes Absolute 0.8  0.1 - 1.0 K/uL   Eosinophils Relative 1  0 - 5 %   Eosinophils Absolute 0.0  0.0 - 0.7 K/uL   Basophils Relative 0  0 - 1 %   Basophils Absolute 0.0  0.0 - 0.1 K/uL  BASIC METABOLIC PANEL     Status: Abnormal   Collection Time    02/28/14 11:27 AM      Result Value Ref Range   Sodium 142  137 - 147 mEq/L   Potassium 3.9  3.7 - 5.3 mEq/L   Chloride 103  96 - 112 mEq/L   CO2 28  19 - 32 mEq/L  Glucose, Bld 100 (*) 70 - 99 mg/dL   BUN 14  6 - 23 mg/dL   Creatinine, Ser 0.65  0.50 - 1.10 mg/dL   Calcium 9.7  8.4 - 10.5 mg/dL   GFR calc non Af Amer 73 (*) >90 mL/min   GFR calc Af Amer 84 (*) >90 mL/min   Comment: (NOTE)     The eGFR has been calculated using the CKD EPI equation.     This calculation has not been validated in all clinical situations.     eGFR's persistently <90 mL/min signify possible Chronic Kidney     Disease.   Anion gap 11  5 - 15  URINALYSIS, ROUTINE W REFLEX MICROSCOPIC     Status: Abnormal   Collection Time    02/28/14 12:47 PM      Result Value Ref Range   Color, Urine YELLOW  YELLOW   APPearance CLEAR  CLEAR   Specific Gravity, Urine 1.005  1.005 - 1.030   pH 7.5  5.0 - 8.0   Glucose, UA NEGATIVE  NEGATIVE mg/dL   Hgb urine dipstick NEGATIVE  NEGATIVE   Bilirubin Urine NEGATIVE  NEGATIVE   Ketones, ur NEGATIVE  NEGATIVE mg/dL   Protein, ur NEGATIVE  NEGATIVE mg/dL   Urobilinogen, UA 0.2  0.0 - 1.0 mg/dL   Nitrite NEGATIVE  NEGATIVE   Leukocytes, UA SMALL (*) NEGATIVE  URINE MICROSCOPIC-ADD ON     Status: Abnormal   Collection Time    02/28/14 12:47 PM      Result Value Ref Range   Squamous Epithelial / LPF RARE  RARE   WBC, UA 7-10  <3 WBC/hpf   RBC / HPF 0-2  <3 RBC/hpf   Bacteria, UA MANY  (*) RARE  HEPATIC FUNCTION PANEL     Status: Abnormal   Collection Time    02/28/14  6:02 PM      Result Value Ref Range   Total Protein 6.7  6.0 - 8.3 g/dL   Albumin 3.0 (*) 3.5 - 5.2 g/dL   AST 23  0 - 37 U/L   ALT 21  0 - 35 U/L   Alkaline Phosphatase 78  39 - 117 U/L   Total Bilirubin 0.4  0.3 - 1.2 mg/dL   Bilirubin, Direct <0.2  0.0 - 0.3 mg/dL   Indirect Bilirubin NOT CALCULATED  0.3 - 0.9 mg/dL  PROTIME-INR     Status: Abnormal   Collection Time    02/28/14  6:02 PM      Result Value Ref Range   Prothrombin Time 30.6 (*) 11.6 - 15.2 seconds   INR 2.91 (*) 0.00 - 1.49    Ct Abdomen Pelvis W Contrast  02/28/2014   CLINICAL DATA:  Back pain, abdominal pain  EXAM: CT ABDOMEN AND PELVIS WITH CONTRAST  TECHNIQUE: Multidetector CT imaging of the abdomen and pelvis was performed using the standard protocol following bolus administration of intravenous contrast.  CONTRAST:  140mL OMNIPAQUE IOHEXOL 300 MG/ML  SOLN  COMPARISON:  11/01/2011  FINDINGS: There is a small left pleural effusion. There is bibasilar airspace disease.  There are stable right and left hepatic lobe low-density lesions likely representing small cysts or hamartomas. There is gallbladder distention. There is intrahepatic and extrahepatic biliary ductal dilatation. The common bile duct measures 17 mm in diameter. The spleen demonstrates no focal abnormality.There is stable bilateral renal cysts. There is a nonobstructing left renal calculus. The adrenal glands are normal. The pancreas is atrophic without focal  abnormality. The bladder is unremarkable.  The stomach, duodenum, small intestine, and large intestine demonstrate no contrast extravasation or dilatation. There is a large amount of stool throughout the colon. There is a small bowel containing right inguinal hernia. There is no pneumoperitoneum, pneumatosis, or portal venous gas. There is no abdominal or pelvic free fluid. There is no lymphadenopathy.  The abdominal  aorta is normal in caliber with atherosclerosis.  There are no lytic or sclerotic osseous lesions. There is diffuse lumbar spine spondylosis. Severe osteoarthritis of the right hip.  IMPRESSION: 1. Large amount of stool throughout the colon. 2. Distended gallbladder with intrahepatic and extrahepatic biliary ductal dilatation. No obstructing mass is identified. 3. Small left pleural effusion. Bibasilar airspace disease likely representing atelectasis versus developing infection. 4. Lumbar spine spondylosis. 5. Abdominal aortic atherosclerosis. 6. Right inguinal hernia containing nondilated small bowel.   Electronically Signed   By: Kathreen Devoid   On: 02/28/2014 17:05   Dg Abd Acute W/chest  02/28/2014   CLINICAL DATA:  Worsening low back pain for the past week, not improved with a back injection.  EXAM: ACUTE ABDOMEN SERIES (ABDOMEN 2 VIEW & CHEST 1 VIEW)  COMPARISON:  Chest dated 11/19/2013  FINDINGS: Grossly stable enlarged cardiac silhouette and prominent interstitial markings. Interval mild bibasilar atelectasis. Stable left subclavian pacemaker leads. Diffuse osteopenia and marked bilateral shoulder degenerative changes. Upper abdominal surgical clips. Normal bowel gas pattern without free peritoneal air. Prominent stool in the colon. Marked lumbar spine degenerative changes and lesser lower thoracic spine degenerative changes including changes of DISH. Right hip fixation screws.  IMPRESSION: 1. Mild bibasilar atelectasis. 2. Stable cardiomegaly and chronic interstitial lung disease. 3. Prominent stool. 4. Marked bilateral shoulder and lumbar spine degenerative changes.   Electronically Signed   By: Enrique Sack M.D.   On: 02/28/2014 12:44   US Abdomen Limited Ruq  02/28/2014   CLINICAL DATA:  Right upper quadrant pain. Ascites. Previous history of perforated ulcer.  EXAM: US ABDOMEN LIMITED - RIGHT UPPER QUADRANT  COMPARISON:  CT abdomen and pelvis 02/28/2014  FINDINGS: Gallbladder:  The gallbladder is  mildly distended with evidence of sludge layering in the gallbladder. No discrete stones. No wall thickening. Murphy's sign is negative.  Common bile duct:  Diameter: 16 mm. Moderate intrahepatic and extrahepatic bile duct dilatation is noted. No intraductal stone is demonstrated although the distal common bile duct is obscured by bowel gas.  Liver:  No focal lesion identified. Within normal limits in parenchymal echogenicity.  IMPRESSION: Moderate intra and extrahepatic bile duct dilatation. Cause is not identified. Mild gallbladder distention and sludge. No stones or inflammatory changes suggested in the gallbladder.   Electronically Signed   By: Lucienne Capers M.D.   On: 02/28/2014 22:38    ROS Please see HPI, otherwise all other systems are negative  Blood pressure 143/65, pulse 59, temperature 99.4 F (37.4 C), temperature source Oral, resp. rate 20, height $RemoveBe'5\' 1"'NLRDfCpIU$  (1.549 m), weight 111 lb 3.2 oz (50.44 kg), SpO2 93.00%. Physical Exam General: pleasant, WD, WN white female who is laying in bed in NAD HEENT: head is normocephalic, atraumatic.  Sclera are noninjected.  PERRL.  Ears and nose without any masses or lesions.  Mouth is pink and moist Heart: regular, rate, and rhythm.  Normal s1,s2. No obvious murmurs, gallops, or rubs noted.  Palpable radial and pedal pulses bilaterally Lungs: CTAB, no wheezes, rhonchi, or rales noted.  Respiratory effort nonlabored Abd: soft, minimal tenderness, ND, +BS, no masses or organomegaly.  Reducible right inguinal hernia and small reducible umbilical hernia MS: all 4 extremities are symmetrical with no cyanosis, clubbing, or edema. Skin: warm and dry with no masses, lesions, or rashes Psych: A&Ox3 with an appropriate affect.   Assessment/Plan: Gallbladder distention likely secondary to process causing biliary ductal dilation.  Since she cannot get MRI due to pacemaker, I recommend GI consult for EUS as an outpatient if patient would like to pursue further  workup.  Realistically, the patient refuses any type of surgical intervention.  I suspect her minimal pain is secondary to constipation and this is an incidental find.  If she were to have an EUS and something was found, she is not a surgical candidate for the most part due to her age and her comorbidities.  As stated earlier, she would also refuse any major operation.  I would treat her constipation for symptom relief.  No further surgical intervention needed.  We will sign off.  Please call if needed.  [name] E 10:56 AM 03/01/2014

## 2014-03-01 NOTE — Progress Notes (Signed)
ANTICOAGULATION CONSULT NOTE - Initial Consult  Pharmacy Consult for Coumadin Indication: atrial fibrillation  Allergies  Allergen Reactions  . Ampicillin Other (See Comments)    Reaction Unknown   . Ciprofloxin Hcl [Ciprofloxacin Hcl] Other (See Comments)    Reaction Unknown, has received Levaquin 12/14  . Demerol Other (See Comments)    Reaction Unknown   . Sulfa Antibiotics Rash    Patient Measurements: Height: 5\' 1"  (154.9 cm) Weight: 111 lb 15.9 oz (50.8 kg) IBW/kg (Calculated) : 47.8 Heparin Dosing Weight:    Vital Signs: Temp: 98.2 F (36.8 C) (10/19 0521) Temp Source: Oral (10/19 0521) BP: 110/70 mmHg (10/19 0521) Pulse Rate: 72 (10/19 0521)  Labs:  Recent Labs  02/28/14 1127 02/28/14 1802 03/01/14 0420  HGB 11.0*  --  11.2*  HCT 34.4*  --  35.0*  PLT 195  --  215  LABPROT  --  30.6* 29.5*  INR  --  2.91* 2.77*  CREATININE 0.65  --  0.67    Estimated Creatinine Clearance: 31 ml/min (by C-G formula based on Cr of 0.67).   Medical History: Past Medical History  Diagnosis Date  . Transient ischemic attack (TIA) 1994  . Atrial fibrillation   . Hypertension   . Dyslipidemia   . Gastritis   . Vitamin B12 deficiency   . Peptic ulcer disease   . Osteopenia   . Macular degeneration, right eye   . Arthritis     back, shoulders  . Cough 05/21/2011    pt states cough x 2 days  . Edema     left leg  . Hypoglycemia   . Urinary incontinence   . CAD 06/15/2009  . Sinoatrial node dysfunction 06/15/2009    Dual-chamber PM inserted 03/18/09 St. Jude Zephyr XL DR dual-chamber, seriel [id number].  . Tachy-brady syndrome     Dual-chamber PM inserted 03/18/09 St. Jude Zephyr XL DR dual-chamber, seriel [id number].  Marland Kitchen CAD (coronary artery disease)     With LAD & RCA BMS, 2008 & LAD DES 2009 for restenosis. Both vessels patent by cath 03/18/09  . Atrial flutter   . Left heart failure   . Dyspnea     frequent  . Rectal bleeding     Aggravated by a combo of coumadin &  aspirin. D/C'ed aspirin.  . Chronic diastolic congestive heart failure   . Asthma with COPD   . GERD (gastroesophageal reflux disease)   . Irritable bowel   . Hip fracture, right   . Anorexia 08/27/12    Nearly 20 lbs weight loss over past year. Anorexia weight loss or major problems now & likely represent a slow progression/worsening of the terminal illness, perhaps GI. Probably time to start considering a change in care plan from aggresive/diagnostic to palliative (Dr. Verdis Prime 08/17/12)  . Coronary atherosclerosis of native coronary artery   . Chest discomfort 08/27/12    Nearly continuous.  . Poor appetite     Medications:  Prescriptions prior to admission  Medication Sig Dispense Refill  . acetaminophen (TYLENOL) 650 MG CR tablet Take 650 mg by mouth every 4 (four) hours as needed for pain or fever.       Marland Kitchen albuterol (PROVENTIL) (5 MG/ML) 0.5% nebulizer solution Take 0.5 mLs (2.5 mg total) by nebulization every 6 (six) hours as needed for wheezing or shortness of breath.  20 mL  12  . albuterol (VENTOLIN HFA) 108 (90 BASE) MCG/ACT inhaler Inhale 2 puffs into the lungs every 4 (four) hours as  needed for wheezing or shortness of breath. Wait 1 minute between each puff.      Marland Kitchen. aluminum & magnesium hydroxide-simethicone (MYLANTA) 500-450-40 MG/5ML suspension Take 30 mLs by mouth every 4 (four) hours as needed for indigestion.      Marland Kitchen. atorvastatin (LIPITOR) 10 MG tablet Take 10 mg by mouth every evening.       . bisacodyl (DULCOLAX) 10 MG suppository Place 10 mg rectally as needed for moderate constipation.      . Buprenorphine (BUTRANS) 7.5 MCG/HR PTWK Place 7.5 mcg onto the skin once a week.      . calcium carbonate (OS-CAL) 600 MG TABS Take 600 mg by mouth 2 (two) times daily.        . Cholecalciferol (VITAMIN D3) 1000 UNITS CAPS Take 1,000 Units by mouth 2 (two) times daily.       . Cyanocobalamin (VITAMIN B-12) 1000 MCG SUBL Place 1 tablet under the tongue daily.      Marland Kitchen. docusate sodium  (COLACE) 100 MG capsule Take 200 mg by mouth daily.      . Fluticasone-Salmeterol (ADVAIR DISKUS) 100-50 MCG/DOSE AEPB Inhale 1 puff into the lungs 2 (two) times daily. Rinse mouth after use.      . furosemide (LASIX) 40 MG tablet Take 40 mg by mouth daily.      Marland Kitchen. guaiFENesin (MUCINEX) 600 MG 12 hr tablet Take 600 mg by mouth 2 (two) times daily.      Marland Kitchen. HYDROcodone-acetaminophen (NORCO/VICODIN) 5-325 MG per tablet Take 1 tablet by mouth 4 (four) times daily as needed for moderate pain.      . magnesium hydroxide (MILK OF MAGNESIA) 400 MG/5ML suspension Take 30 mLs by mouth daily as needed for mild constipation.      . metoprolol succinate (TOPROL-XL) 50 MG 24 hr tablet Take 1 tablet (50 mg total) by mouth daily. Take with or immediately following a meal.      . mirtazapine (REMERON) 7.5 MG tablet Take 1 tablet (7.5 mg total) by mouth at bedtime.  30 tablet  0  . Multiple Vitamins-Minerals (PRESERVISION AREDS) CAPS Take 1 capsule by mouth 2 (two) times daily.      . nitrofurantoin, macrocrystal-monohydrate, (MACROBID) 100 MG capsule Take 100 mg by mouth 2 (two) times daily. For 7 days      . nitroGLYCERIN (NITRODUR - DOSED IN MG/24 HR) 0.4 mg/hr Place 1 patch onto the skin every evening. Nitrate free interval from 4pm - 8pm every day      . nitroGLYCERIN (NITROSTAT) 0.4 MG SL tablet Place 0.4 mg under the tongue every 5 (five) minutes as needed for chest pain (up to 3 doses).       . Omega-3 Fatty Acids (FISH OIL) 1000 MG CAPS Take 1,000 mg by mouth 2 (two) times daily.      Marland Kitchen. oxybutynin (DITROPAN-XL) 10 MG 24 hr tablet Take 10 mg by mouth at bedtime.      . pantoprazole (PROTONIX) 40 MG tablet Take 40 mg by mouth 2 (two) times daily.      . polyethylene glycol (MIRALAX / GLYCOLAX) packet Take 17 g by mouth daily as needed for moderate constipation.       . potassium chloride (K-DUR,KLOR-CON) 10 MEQ tablet Take 10 mEq by mouth daily.      . predniSONE (DELTASONE) 10 MG tablet Take 10-60 mg by mouth  daily with breakfast. Take 60 mg by mouth on day 1, take 50 mg by mouth on day 2, take  40 mg by mouth on day 3, take 30 mg by mouth on day 4, take 20 mg by mouth on day 5, then take 10 mg by mouth on day 6.      . warfarin (COUMADIN) 4 MG tablet Take 4 mg by mouth daily.        Assessment: Worsening severe lumbar back pain shooting down R leg. 80 y/o F presents with above complaints. She was started on prednisone 2 days prior to admission. She has also not had a BM x 5d. Found to have intra and extrahepatic biliary ductal dilitation and a distended gallbladder. She also had significant stool burden and a UTI. LFT's WNL on admit.  Anticoagulation: Afib on Coumadin PTA (4mg  daily) with admit INR 2.91. Today INR 2.77. Mildly anemic 11.2.  Infectious Disease: UTI.  Cardiovascular: Afib, CHF, HTN. VSS. Meds: Lipitor10, po Lasix, Toprol XL, K+  Endocrinology: Glucose 89  Gastrointestinal / Nutrition: Severe constipation. Colace, Miralax, Sorbitol  Neurology: Remeron  Nephrology: Scr 0.67 pm Ditropan XL:  Pulmonary: Mucinex, Duelra  Hematology / Oncology: Mild anemia  PTA Medication Issues: Butrans patch, Oscal, Vit D, B12, Nitrofurantoin (CrCl on 31!--contraindicated), NTG patch, fish oil, PPI, Prednisone   Goal of Therapy:  INR 2-3 Monitor platelets by anticoagulation protocol: Yes   Plan:  Resume Coumadin 4mg  daily. Daily INR F/u to resume quite a few home meds.    Aza Dantes S. Merilynn Finlandobertson, PharmD, BCPS Clinical Staff Pharmacist Pager [phone]  Misty Stanleyobertson, Maveryck Bahri Stillinger 03/01/2014,9:58 AM

## 2014-03-02 ENCOUNTER — Inpatient Hospital Stay (HOSPITAL_COMMUNITY): Payer: Medicare Other

## 2014-03-02 LAB — PROTIME-INR
INR: 2.9 — ABNORMAL HIGH (ref 0.00–1.49)
Prothrombin Time: 30.6 seconds — ABNORMAL HIGH (ref 11.6–15.2)

## 2014-03-02 MED ORDER — MORPHINE SULFATE ER 15 MG PO TBCR
15.0000 mg | EXTENDED_RELEASE_TABLET | Freq: Every day | ORAL | Status: DC
  Administered 2014-03-02 – 2014-03-04 (×3): 15 mg via ORAL
  Filled 2014-03-02 (×3): qty 1

## 2014-03-02 MED ORDER — SORBITOL 70 % SOLN
960.0000 mL | TOPICAL_OIL | Freq: Once | ORAL | Status: AC
  Administered 2014-03-02: 960 mL via RECTAL
  Filled 2014-03-02: qty 240

## 2014-03-02 NOTE — Progress Notes (Signed)
Noticed that patient had not voided since 1330 yesterday. Notified on-call hospitalist. Orders given to do an In and out catheter. Performed straight cath and output was 500cc and charted in Epic. Also notified hospitalist and will continue to monitor to end of shift and notify oncoming nurse

## 2014-03-02 NOTE — Care Management Note (Signed)
    Page 1 of 1   03/02/2014     2:25:06 PM CARE MANAGEMENT NOTE 03/02/2014  Patient:  [name],[name]   Account Number:  [id number]  Date Initiated:  03/02/2014  Documentation initiated by:  [facility],Naylani Bradner  Subjective/Objective Assessment:   80 yo hx of back pain due to spinal stenosis; severe leg, back and hips pain; stool burden and dealated gall bladder no evidence of cholecystitis. //From [facility]     Action/Plan:   CT of abdomen;  IV Morphine.// Assist with return to SNF.   Anticipated DC Date:  03/04/2014   Anticipated DC Plan:  SKILLED NURSING FACILITY  In-house referral  Clinical Social Worker         Choice offered to / List presented to:             Status of service:  In process, will continue to follow Medicare Important Message given?   (If response is "NO", the following Medicare IM given date fields will be blank) Date Medicare IM given:   Medicare IM given by:   Date Additional Medicare IM given:   Additional Medicare IM given by:    Discharge Disposition:    Per UR Regulation:  Reviewed for med. necessity/level of care/duration of stay  If discussed at Long Length of Stay Meetings, dates discussed:    Comments:

## 2014-03-02 NOTE — Progress Notes (Signed)
[facility] PROGRESS NOTE  Assessment/Plan: RUQ abdominal pain - Dilated duct on US, Murphy sign negative. No RUQ pain, her pain was more diffuse. LFT's within normal. - ? If due to constipation, start miralax, sorbitol and SMOG, repeat abd x-ray. - Last bowel movement 6 days ago. - repeat x-ray in am, she had several BM. She relates her pain is improved.  UTI (lower urinary tract infection) - no leukocytosis, no supra pubic tenderness, no urgency. - hold antibiotics.  A-fib: - resume coumadin. - rate controlled.  Chronic diastolic CHF (congestive heart failure): - KVO IV fluids. - allow full liq. diet.  Essential hypertension - stable.    Code Status: DNR/DNI Family Communication: none  Disposition Plan: inpatient   Consultants:  Surgery  Procedures:  Ct abd  Antibiotics:  Rocephin for 1 day now stopped  HPI/Subjective: - Abdominal pain improved. - no BM in almost a week.  Objective: Filed Vitals:   03/01/14 2112 03/01/14 2119 03/02/14 0534 03/02/14 0924  BP: 112/46  118/75   Pulse: 76     Temp: 99.1 F (37.3 C)  98.4 F (36.9 C)   TempSrc: Oral  Oral   Resp: 18  18   Height:      Weight:   52.2 kg (115 lb 1.3 oz)   SpO2: 94% 92% 97% 93%    Intake/Output Summary (Last 24 hours) at 03/02/14 1017 Last data filed at 03/02/14 1000  Gross per 24 hour  Intake    435 ml  Output    500 ml  Net    -65 ml   Filed Weights   02/28/14 2224 03/01/14 0521 03/02/14 0534  Weight: 50.44 kg (111 lb 3.2 oz) 50.8 kg (111 lb 15.9 oz) 52.2 kg (115 lb 1.3 oz)    Exam:  General: Alert, awake, oriented x3, in no acute distress.  HEENT: No bruits, no goiter. Heart: Regular rate and rhythm . Lungs: Good air movement, clear  Abdomen: Soft, diffuse tender, distended, positive bowel sounds.    Data Reviewed: Basic Metabolic Panel:  Recent Labs Lab 02/28/14 1127 03/01/14 0420  NA 142 144  K 3.9 3.8  CL 103 106  CO2 28 25  GLUCOSE 100* 79  BUN  14 13  CREATININE 0.65 0.67  CALCIUM 9.7 9.1  MG  --  2.2  PHOS  --  3.1   Liver Function Tests:  Recent Labs Lab 02/28/14 1802 03/01/14 0420  AST 23 22  ALT 21 20  ALKPHOS 78 78  BILITOT 0.4 0.4  PROT 6.7 6.6  ALBUMIN 3.0* 2.8*   No results found for this basename: LIPASE, AMYLASE,  in the last 168 hours No results found for this basename: AMMONIA,  in the last 168 hours CBC:  Recent Labs Lab 02/28/14 1127 03/01/14 0420  WBC 6.4 7.3  NEUTROABS 4.2  --   HGB 11.0* 11.2*  HCT 34.4* 35.0*  MCV 93.2 93.8  PLT 195 215   Cardiac Enzymes: No results found for this basename: CKTOTAL, CKMB, CKMBINDEX, TROPONINI,  in the last 168 hours BNP (last 3 results) No results found for this basename: PROBNP,  in the last 8760 hours CBG: No results found for this basename: GLUCAP,  in the last 168 hours  No results found for this or any previous visit (from the past 240 hour(s)).   Studies: Dg Abd 1 View  03/02/2014   CLINICAL DATA:  Constipation  EXAM: ABDOMEN - 1 VIEW  COMPARISON:  03/01/2014  FINDINGS:  Retained contrast in stool throughout a mildly distended colon.  No evidence of bowel obstruction or bowel wall thickening.  Excreted urinary contrast seen on previous exam no longer identified.  Scattered atherosclerotic calcifications.  Osseous demineralization with multilevel degenerative disc and facet disease changes of the thoracolumbar spine.  Prior pinning of proximal RIGHT femur with osteoarthritic changes of RIGHT hip joint.  IMPRESSION: Increased stool and retained contrast throughout colon, progressed more distally than on previous exam.   Electronically Signed   By: Ulyses SouthwardMark  Boles M.D.   On: 03/02/2014 08:28   Abd 1 View (kub)  03/01/2014   CLINICAL DATA:  Abdominal pain and constipation  EXAM: ABDOMEN - 1 VIEW  COMPARISON:  02/28/1949  FINDINGS: Scattered large and small bowel gas is noted. Contrast material is noted throughout the colon from recent CT examination. The  bladder is also to well distended with contrast enhanced urine. Moderate fecal material is noted within the colon. No obstructive changes are seen. No free air is noted. Degenerative change of the lumbar spine is noted. Postsurgical change in the right hip is seen.  IMPRESSION: Fecal material within the colon without obstructive change. The overall appearance is stable from the prior study.   Electronically Signed   By: Alcide CleverMark  Lukens M.D.   On: 03/01/2014 10:54   Ct Abdomen Pelvis W Contrast  02/28/2014   CLINICAL DATA:  Back pain, abdominal pain  EXAM: CT ABDOMEN AND PELVIS WITH CONTRAST  TECHNIQUE: Multidetector CT imaging of the abdomen and pelvis was performed using the standard protocol following bolus administration of intravenous contrast.  CONTRAST:  100mL OMNIPAQUE IOHEXOL 300 MG/ML  SOLN  COMPARISON:  11/01/2011  FINDINGS: There is a small left pleural effusion. There is bibasilar airspace disease.  There are stable right and left hepatic lobe low-density lesions likely representing small cysts or hamartomas. There is gallbladder distention. There is intrahepatic and extrahepatic biliary ductal dilatation. The common bile duct measures 17 mm in diameter. The spleen demonstrates no focal abnormality.There is stable bilateral renal cysts. There is a nonobstructing left renal calculus. The adrenal glands are normal. The pancreas is atrophic without focal abnormality. The bladder is unremarkable.  The stomach, duodenum, small intestine, and large intestine demonstrate no contrast extravasation or dilatation. There is a large amount of stool throughout the colon. There is a small bowel containing right inguinal hernia. There is no pneumoperitoneum, pneumatosis, or portal venous gas. There is no abdominal or pelvic free fluid. There is no lymphadenopathy.  The abdominal aorta is normal in caliber with atherosclerosis.  There are no lytic or sclerotic osseous lesions. There is diffuse lumbar spine spondylosis.  Severe osteoarthritis of the right hip.  IMPRESSION: 1. Large amount of stool throughout the colon. 2. Distended gallbladder with intrahepatic and extrahepatic biliary ductal dilatation. No obstructing mass is identified. 3. Small left pleural effusion. Bibasilar airspace disease likely representing atelectasis versus developing infection. 4. Lumbar spine spondylosis. 5. Abdominal aortic atherosclerosis. 6. Right inguinal hernia containing nondilated small bowel.   Electronically Signed   By: Elige KoHetal  Patel   On: 02/28/2014 17:05   Dg Abd Acute W/chest  02/28/2014   CLINICAL DATA:  Worsening low back pain for the past week, not improved with a back injection.  EXAM: ACUTE ABDOMEN SERIES (ABDOMEN 2 VIEW & CHEST 1 VIEW)  COMPARISON:  Chest dated 11/19/2013  FINDINGS: Grossly stable enlarged cardiac silhouette and prominent interstitial markings. Interval mild bibasilar atelectasis. Stable left subclavian pacemaker leads. Diffuse osteopenia and marked bilateral shoulder  degenerative changes. Upper abdominal surgical clips. Normal bowel gas pattern without free peritoneal air. Prominent stool in the colon. Marked lumbar spine degenerative changes and lesser lower thoracic spine degenerative changes including changes of DISH. Right hip fixation screws.  IMPRESSION: 1. Mild bibasilar atelectasis. 2. Stable cardiomegaly and chronic interstitial lung disease. 3. Prominent stool. 4. Marked bilateral shoulder and lumbar spine degenerative changes.   Electronically Signed   By: Gordan Payment M.D.   On: 02/28/2014 12:44   US Abdomen Limited Ruq  02/28/2014   CLINICAL DATA:  Right upper quadrant pain. Ascites. Previous history of perforated ulcer.  EXAM: US ABDOMEN LIMITED - RIGHT UPPER QUADRANT  COMPARISON:  CT abdomen and pelvis 02/28/2014  FINDINGS: Gallbladder:  The gallbladder is mildly distended with evidence of sludge layering in the gallbladder. No discrete stones. No wall thickening. Murphy's sign is negative.   Common bile duct:  Diameter: 16 mm. Moderate intrahepatic and extrahepatic bile duct dilatation is noted. No intraductal stone is demonstrated although the distal common bile duct is obscured by bowel gas.  Liver:  No focal lesion identified. Within normal limits in parenchymal echogenicity.  IMPRESSION: Moderate intra and extrahepatic bile duct dilatation. Cause is not identified. Mild gallbladder distention and sludge. No stones or inflammatory changes suggested in the gallbladder.   Electronically Signed   By: Burman Nieves M.D.   On: 02/28/2014 22:38    Scheduled Meds: . atorvastatin  10 mg Oral QPM  . Buprenorphine  7.5 mcg Transdermal Weekly  . docusate sodium  200 mg Oral Daily  . furosemide  40 mg Oral Daily  . guaiFENesin  600 mg Oral BID  . metoprolol succinate  50 mg Oral Daily  . mirtazapine  7.5 mg Oral QHS  . mometasone-formoterol  2 puff Inhalation BID  . oxybutynin  10 mg Oral QHS  . pantoprazole  40 mg Oral BID  . polyethylene glycol  17 g Oral BID  . potassium chloride  10 mEq Oral Daily  . warfarin  4 mg Oral q1800  . Warfarin - Pharmacist Dosing Inpatient   Does not apply q1800   Continuous Infusions:    Marinda Elk  [facility] Pager [phone]. If 8PM-8AM, please contact night-coverage at [email or web address], password [facility] 03/02/2014, 10:17 AM  LOS: 2 days

## 2014-03-02 NOTE — Progress Notes (Signed)
In regards to patient having a straight cath, patient stated she never felt any pressure or felt like she had to void at all. Tolerated straight cath with no discomfort. Will continue to monitor to end of shift.

## 2014-03-02 NOTE — Progress Notes (Signed)
ANTICOAGULATION CONSULT NOTE - Initial Consult  Pharmacy Consult for Coumadin Indication: atrial fibrillation  Allergies  Allergen Reactions  . Ampicillin Other (See Comments)    Reaction Unknown   . Ciprofloxin Hcl [Ciprofloxacin Hcl] Other (See Comments)    Reaction Unknown, has received Levaquin 12/14  . Demerol Other (See Comments)    Reaction Unknown   . Sulfa Antibiotics Rash    Patient Measurements: Height: 5\' 1"  (154.9 cm) Weight: 115 lb 1.3 oz (52.2 kg) IBW/kg (Calculated) : 47.8 Heparin Dosing Weight:    Vital Signs: Temp: 98.4 F (36.9 C) (10/20 0534) Temp Source: Oral (10/20 0534) BP: 118/75 mmHg (10/20 0534) Pulse Rate: 76 (10/19 2112)  Labs:  Recent Labs  02/28/14 1127 02/28/14 1802 03/01/14 0420 03/02/14 0507  HGB 11.0*  --  11.2*  --   HCT 34.4*  --  35.0*  --   PLT 195  --  215  --   LABPROT  --  30.6* 29.5* 30.6*  INR  --  2.91* 2.77* 2.90*  CREATININE 0.65  --  0.67  --     Estimated Creatinine Clearance: 31 ml/min (by C-G formula based on Cr of 0.67).   Medical History: Past Medical History  Diagnosis Date  . Transient ischemic attack (TIA) 1994  . Atrial fibrillation   . Hypertension   . Dyslipidemia   . Gastritis   . Vitamin B12 deficiency   . Peptic ulcer disease   . Osteopenia   . Macular degeneration, right eye   . Arthritis     back, shoulders  . Cough 05/21/2011    pt states cough x 2 days  . Edema     left leg  . Hypoglycemia   . Urinary incontinence   . CAD 06/15/2009  . Sinoatrial node dysfunction 06/15/2009    Dual-chamber PM inserted 03/18/09 [employer] Zephyr XL DR dual-chamber, seriel [id number].  . Tachy-brady syndrome     Dual-chamber PM inserted 03/18/09 St. Jude Zephyr XL DR dual-chamber, seriel [id number].  Marland Kitchen. CAD (coronary artery disease)     With LAD & RCA BMS, 2008 & LAD DES 2009 for restenosis. Both vessels patent by cath 03/18/09  . Atrial flutter   . Left heart failure   . Dyspnea     frequent  . Rectal  bleeding     Aggravated by a combo of coumadin & aspirin. D/C'ed aspirin.  . Chronic diastolic congestive heart failure   . Asthma with COPD   . GERD (gastroesophageal reflux disease)   . Irritable bowel   . Hip fracture, right   . Anorexia 08/27/12    Nearly 20 lbs weight loss over past year. Anorexia weight loss or major problems now & likely represent a slow progression/worsening of the terminal illness, perhaps GI. Probably time to start considering a change in care plan from aggresive/diagnostic to palliative (Dr. Verdis PrimeHenry Smith 08/17/12)  . Coronary atherosclerosis of native coronary artery   . Chest discomfort 08/27/12    Nearly continuous.  . Poor appetite     Medications:  Prescriptions prior to admission  Medication Sig Dispense Refill  . acetaminophen (TYLENOL) 650 MG CR tablet Take 650 mg by mouth every 4 (four) hours as needed for pain or fever.       Marland Kitchen. albuterol (PROVENTIL) (5 MG/ML) 0.5% nebulizer solution Take 0.5 mLs (2.5 mg total) by nebulization every 6 (six) hours as needed for wheezing or shortness of breath.  20 mL  12  . albuterol (VENTOLIN HFA)  108 (90 BASE) MCG/ACT inhaler Inhale 2 puffs into the lungs every 4 (four) hours as needed for wheezing or shortness of breath. Wait 1 minute between each puff.      Marland Kitchen aluminum & magnesium hydroxide-simethicone (MYLANTA) 500-450-40 MG/5ML suspension Take 30 mLs by mouth every 4 (four) hours as needed for indigestion.      Marland Kitchen atorvastatin (LIPITOR) 10 MG tablet Take 10 mg by mouth every evening.       . bisacodyl (DULCOLAX) 10 MG suppository Place 10 mg rectally as needed for moderate constipation.      . Buprenorphine (BUTRANS) 7.5 MCG/HR PTWK Place 7.5 mcg onto the skin once a week.      . calcium carbonate (OS-CAL) 600 MG TABS Take 600 mg by mouth 2 (two) times daily.        . Cholecalciferol (VITAMIN D3) 1000 UNITS CAPS Take 1,000 Units by mouth 2 (two) times daily.       . Cyanocobalamin (VITAMIN B-12) 1000 MCG SUBL Place 1  tablet under the tongue daily.      Marland Kitchen docusate sodium (COLACE) 100 MG capsule Take 200 mg by mouth daily.      . Fluticasone-Salmeterol (ADVAIR DISKUS) 100-50 MCG/DOSE AEPB Inhale 1 puff into the lungs 2 (two) times daily. Rinse mouth after use.      . furosemide (LASIX) 40 MG tablet Take 40 mg by mouth daily.      Marland Kitchen guaiFENesin (MUCINEX) 600 MG 12 hr tablet Take 600 mg by mouth 2 (two) times daily.      Marland Kitchen HYDROcodone-acetaminophen (NORCO/VICODIN) 5-325 MG per tablet Take 1 tablet by mouth 4 (four) times daily as needed for moderate pain.      . magnesium hydroxide (MILK OF MAGNESIA) 400 MG/5ML suspension Take 30 mLs by mouth daily as needed for mild constipation.      . metoprolol succinate (TOPROL-XL) 50 MG 24 hr tablet Take 1 tablet (50 mg total) by mouth daily. Take with or immediately following a meal.      . mirtazapine (REMERON) 7.5 MG tablet Take 1 tablet (7.5 mg total) by mouth at bedtime.  30 tablet  0  . Multiple Vitamins-Minerals (PRESERVISION AREDS) CAPS Take 1 capsule by mouth 2 (two) times daily.      . nitrofurantoin, macrocrystal-monohydrate, (MACROBID) 100 MG capsule Take 100 mg by mouth 2 (two) times daily. For 7 days      . nitroGLYCERIN (NITRODUR - DOSED IN MG/24 HR) 0.4 mg/hr Place 1 patch onto the skin every evening. Nitrate free interval from 4pm - 8pm every day      . nitroGLYCERIN (NITROSTAT) 0.4 MG SL tablet Place 0.4 mg under the tongue every 5 (five) minutes as needed for chest pain (up to 3 doses).       . Omega-3 Fatty Acids (FISH OIL) 1000 MG CAPS Take 1,000 mg by mouth 2 (two) times daily.      Marland Kitchen oxybutynin (DITROPAN-XL) 10 MG 24 hr tablet Take 10 mg by mouth at bedtime.      . pantoprazole (PROTONIX) 40 MG tablet Take 40 mg by mouth 2 (two) times daily.      . polyethylene glycol (MIRALAX / GLYCOLAX) packet Take 17 g by mouth daily as needed for moderate constipation.       . potassium chloride (K-DUR,KLOR-CON) 10 MEQ tablet Take 10 mEq by mouth daily.      .  predniSONE (DELTASONE) 10 MG tablet Take 10-60 mg by mouth daily with breakfast. Take  60 mg by mouth on day 1, take 50 mg by mouth on day 2, take 40 mg by mouth on day 3, take 30 mg by mouth on day 4, take 20 mg by mouth on day 5, then take 10 mg by mouth on day 6.      . warfarin (COUMADIN) 4 MG tablet Take 4 mg by mouth daily.        Assessment: Worsening severe lumbar back pain shooting down R leg.  80 y/o F presents with above complaints. She was started on prednisone 2 days prior to admission. She has also not had a BM x 5d. Found to have intra and extrahepatic biliary ductal dilitation and a distended gallbladder. She also had significant stool burden and a UTI. LFT's WNL on admit.  Anticoagulation: Afib on Coumadin PTA (4mg  daily) with admit INR 2.91. Today INR 2.9. Mildly anemic 11.2.  Infectious Disease: UTI via UA. No abx. Afebrile. WBC 7.3. Asymptomatic.  Cardiovascular: Afib, CHF, HTN. VSS. Meds: Lipitor10, po Lasix, Toprol XL, K+  Endocrinology: Glucose 79  Gastrointestinal / Nutrition: Severe constipation. Colace, Miralax, Sorbitol. Significant stool since yesterday but stool remains on xray.  Neurology: Remeron  Nephrology: Scr 0.67 pm Ditropan XL. Difficulty urinating requiring I/O cath.  Pulmonary: Mucinex, Duelra  Hematology / Oncology: Mild anemia  PTA Medication Issues: Oscal, Vit D, B12, Nitrofurantoin (CrCl on 31!--contraindicated), NTG patch, fish oil, Prednisone (not needed)   Goal of Therapy:  INR 2-3 Monitor platelets by anticoagulation protocol: Yes   Plan:  Resume Coumadin 4mg  daily. Daily INR    Korie Streat S. Merilynn Finlandobertson, PharmD, BCPS Clinical Staff Pharmacist Pager [phone]  Misty Stanleyobertson, Harveen Flesch Stillinger 03/02/2014,8:51 AM

## 2014-03-02 NOTE — Progress Notes (Signed)
Earlier in the shift, patient complained of lower back pain. Reposititioned patient and patient requested for pain medication. Morphine given per PRN order for pain level was 7 on scale of 1-10. Pain medication effective. Will continue to monitor patient to end of shift.

## 2014-03-02 NOTE — Evaluation (Signed)
Physical Therapy Evaluation Patient Details Name: [name] D Bohlin MRN: [id number] DOB: [birth year] Today's Date: 03/02/2014   History of Present Illness  80 yo female adm with multifactorial abd pain; PMH:  TIA, CHF, COPD, R hip pinning, CAD, pacemaker, spinal stenosis  Clinical Impression  Pt  Willing to work with PT with encouragement today. She is limited by pain, if pain more controlled, mobility may improve and  she may be able to return to ALF setting with HHPT vs SNF; Pt  Will benefit from PT to address deficits below; discussed with MD    Follow Up Recommendations Home health PT;SNF (HHPT at ALF)    Equipment Recommendations  None recommended by PT    Recommendations for Other Services       Precautions / Restrictions Precautions Precautions: Fall      Mobility  Bed Mobility Overal bed mobility: Needs Assistance Bed Mobility: Supine to Sit     Supine to sit: Mod assist     General bed mobility comments: incr time, cues to self assist, HOB elevate d/t back pain  Transfers Overall transfer level: Needs assistance Equipment used: Rolling walker (2 wheeled) Transfers: Sit to/from Stand Sit to Stand: Min assist         General transfer comment: cues for hand placement, control of descent  Ambulation/Gait Ambulation/Gait assistance: Min assist Ambulation Distance (Feet): 15 Feet Assistive device: Rolling walker (2 wheeled) Gait Pattern/deviations: Step-through pattern;Decreased stride length;Trunk flexed     General Gait Details: verbal cues for RW safety, technique, posture and step length; HR 109-121  Stairs            Wheelchair Mobility    Modified Rankin (Stroke Patients Only)       Balance Overall balance assessment: Needs assistance Sitting-balance support: Feet supported;No upper extremity supported Sitting balance-Leahy Scale: Fair       Standing balance-Leahy Scale: Poor Standing balance comment: requires support of RW to  maintain standing                             Pertinent Vitals/Pain Pain Assessment: No/denies pain    Home Living Family/patient expects to be discharged to:: Assisted living               Home Equipment: Walker - 4 wheels;Bedside commode      Prior Function Level of Independence: Independent with assistive device(s)         Comments: ambulates to dining room with rollator     Hand Dominance        Extremity/Trunk Assessment   Upper Extremity Assessment: Generalized weakness;Defer to OT evaluation           Lower Extremity Assessment: Generalized weakness         Communication   Communication: HOH  Cognition Arousal/Alertness: Awake/alert Behavior During Therapy: WFL for tasks assessed/performed Overall Cognitive Status: Within Functional Limits for tasks assessed                      General Comments      Exercises        Assessment/Plan    PT Assessment Patient needs continued PT services  PT Diagnosis Difficulty walking;Generalized weakness   PT Problem List Decreased strength;Decreased activity tolerance;Decreased balance;Decreased mobility;Decreased knowledge of use of DME  PT Treatment Interventions Gait training;Functional mobility training;Therapeutic activities;Therapeutic exercise;Patient/family education   PT Goals (Current goals can be found in the Care Plan section) Acute  Rehab PT Goals Patient Stated Goal: less pain PT Goal Formulation: With patient Time For Goal Achievement: 03/09/14 Potential to Achieve Goals: Good    Frequency Min 3X/week   Barriers to discharge        Co-evaluation               End of Session Equipment Utilized During Treatment: Gait belt Activity Tolerance: Patient tolerated treatment well Patient left: with call bell/phone within reach;in chair;with family/visitor present           Time: 1027-1056 PT Time Calculation (min): 29 min   Charges:   PT  Evaluation $Initial PT Evaluation Tier I: 1 Procedure PT Treatments $Gait Training: 8-22 mins $Therapeutic Activity: 8-22 mins   PT G Codes:          Cindy Brindisi 03/02/2014, 1:41 PM

## 2014-03-02 NOTE — Discharge Instructions (Signed)
Information on my medicine - Coumadin®   (Warfarin) ° °This medication education was reviewed with me or my healthcare representative as part of my discharge preparation.  The pharmacist that spoke with me during my hospital stay was:  Bryanah Sidell Stillinger, RPH ° °Why was Coumadin prescribed for you? °Coumadin was prescribed for you because you have a blood clot or a medical condition that can cause an increased risk of forming blood clots. Blood clots can cause serious health problems by blocking the flow of blood to the heart, lung, or brain. Coumadin can prevent harmful blood clots from forming. °As a reminder your indication for Coumadin is:   Stroke Prevention Because Of Atrial Fibrillation ° °What test will check on my response to Coumadin? °While on Coumadin (warfarin) you will need to have an INR test regularly to ensure that your dose is keeping you in the desired range. The INR (international normalized ratio) number is calculated from the result of the laboratory test called prothrombin time (PT). ° °If an INR APPOINTMENT HAS NOT ALREADY BEEN MADE FOR YOU please schedule an appointment to have this lab work done by your health care provider within 7 days. °Your INR goal is usually a number between:  2 to 3 or your provider may give you a more narrow range like 2-2.5.  Ask your health care provider during an office visit what your goal INR is. ° °What  do you need to  know  About  COUMADIN? °Take Coumadin (warfarin) exactly as prescribed by your healthcare provider about the same time each day.  DO NOT stop taking without talking to the doctor who prescribed the medication.  Stopping without other blood clot prevention medication to take the place of Coumadin may increase your risk of developing a new clot or stroke.  Get refills before you run out. ° °What do you do if you miss a dose? °If you miss a dose, take it as soon as you remember on the same day then continue your regularly scheduled  regimen the next day.  Do not take two doses of Coumadin at the same time. ° °Important Safety Information °A possible side effect of Coumadin (Warfarin) is an increased risk of bleeding. You should call your healthcare provider right away if you experience any of the following: °  Bleeding from an injury or your nose that does not stop. °  Unusual colored urine (red or dark brown) or unusual colored stools (red or black). °  Unusual bruising for unknown reasons. °  A serious fall or if you hit your head (even if there is no bleeding). ° °Some foods or medicines interact with Coumadin® (warfarin) and might alter your response to warfarin. To help avoid this: °  Eat a balanced diet, maintaining a consistent amount of Vitamin K. °  Notify your provider about major diet changes you plan to make. °  Avoid alcohol or limit your intake to 1 drink for women and 2 drinks for men per day. °(1 drink is 5 oz. wine, 12 oz. beer, or 1.5 oz. liquor.) ° °Make sure that ANY health care provider who prescribes medication for you knows that you are taking Coumadin (warfarin).  Also make sure the healthcare provider who is monitoring your Coumadin knows when you have started a new medication including herbals and non-prescription products. ° °Coumadin® (Warfarin)  Major Drug Interactions  °Increased Warfarin Effect Decreased Warfarin Effect  °Alcohol (large quantities) °Antibiotics (esp. Septra/Bactrim, Flagyl, Cipro) °Amiodarone (Cordarone) °Aspirin (  ASA) °Cimetidine (Tagamet) °Megestrol (Megace) °NSAIDs (ibuprofen, naproxen, etc.) °Piroxicam (Feldene) °Propafenone (Rythmol SR) °Propranolol (Inderal) °Isoniazid (INH) °Posaconazole (Noxafil) Barbiturates (Phenobarbital) °Carbamazepine (Tegretol) °Chlordiazepoxide (Librium) °Cholestyramine (Questran) °Griseofulvin °Oral Contraceptives °Rifampin °Sucralfate (Carafate) °Vitamin K  ° °Coumadin® (Warfarin) Major Herbal Interactions  °Increased Warfarin Effect Decreased Warfarin Effect    °Garlic °Ginseng °Ginkgo biloba Coenzyme Q10 °Green tea °St. John’s wort   ° °Coumadin® (Warfarin) FOOD Interactions  °Eat a consistent number of servings per week of foods HIGH in Vitamin K °(1 serving = ½ cup)  °Collards (cooked, or boiled & drained) °Kale (cooked, or boiled & drained) °Mustard greens (cooked, or boiled & drained) °Parsley *serving size only = ¼ cup °Spinach (cooked, or boiled & drained) °Swiss chard (cooked, or boiled & drained) °Turnip greens (cooked, or boiled & drained)  °Eat a consistent number of servings per week of foods MEDIUM-HIGH in Vitamin K °(1 serving = 1 cup)  °Asparagus (cooked, or boiled & drained) °Broccoli (cooked, boiled & drained, or raw & chopped) °Brussel sprouts (cooked, or boiled & drained) *serving size only = ½ cup °Lettuce, raw (green leaf, endive, romaine) °Spinach, raw °Turnip greens, raw & chopped  ° °These websites have more information on Coumadin (warfarin):  www.coumadin.com; °www.ahrq.gov/consumer/coumadin.htm; ° ° ° °

## 2014-03-03 ENCOUNTER — Inpatient Hospital Stay (HOSPITAL_COMMUNITY): Payer: Medicare Other

## 2014-03-03 DIAGNOSIS — I481 Persistent atrial fibrillation: Secondary | ICD-10-CM

## 2014-03-03 LAB — PROTIME-INR
INR: 2.65 — AB (ref 0.00–1.49)
Prothrombin Time: 28.5 seconds — ABNORMAL HIGH (ref 11.6–15.2)

## 2014-03-03 LAB — T4, FREE: Free T4: 1.18 ng/dL (ref 0.80–1.80)

## 2014-03-03 MED ORDER — SORBITOL 70 % SOLN
30.0000 mL | Status: AC
  Administered 2014-03-03: 30 mL via ORAL
  Filled 2014-03-03: qty 30

## 2014-03-03 MED ORDER — BISACODYL 5 MG PO TBEC
10.0000 mg | DELAYED_RELEASE_TABLET | Freq: Every day | ORAL | Status: DC
  Administered 2014-03-03 – 2014-03-04 (×2): 10 mg via ORAL
  Filled 2014-03-03 (×2): qty 2

## 2014-03-03 NOTE — Progress Notes (Signed)
Pt ambulated in room with walker to doorway and back to chair. C/o much lower back pain.Laxatives given as ordered with no results yet

## 2014-03-03 NOTE — Progress Notes (Signed)
[facility] PROGRESS NOTE Hospital Course: This is a 80 y/o female with PMH of TIA, A-fib on coumadin, Back pain which is chronic, who presents to the hospital with uncontrolled back pain and RUQ pain and was found to have severe constipation   Assessment/Plan: RUQ abdominal pain - Dilated duct on US, Murphy sign negative. No RUQ pain, her pain was more diffuse. LFT's within normal. - ? If due to constipation, started miralax, sorbitol and SMOG enema- had large BM- stool burden improved but significant amount of stool in right and transverse colon- will give further laxatives by mouth - She relates her pain is improved.  Chronic back pain - gets "injections in her back" per her family by Dr Ethelene Halamos- they will call to have her scheduled for another one as soon as possible as her back pain is uncontrolled despite use of narcotics - cont current doses of narcotics - MS Contin and IV Morphine  UTI (lower urinary tract infection) - no leukocytosis, no supra pubic tenderness, no urgency. - hold antibiotics.  A-fib: - on coumadin. - rate controlled.  Chronic diastolic CHF (congestive heart failure): - compensated- on Lasix and Metoprolol  Essential hypertension - stable.    Code Status: DNR/DNI Family Communication: with son and daughter at bedside Disposition Plan: return to SNF   Consultants:  Surgery  Procedures:  Ct abd  Antibiotics:  Rocephin for 1 day now stopped  HPI/Subjective: Currently has increased back pain - awaiting her Morphine  Objective: Filed Vitals:   03/02/14 2047 03/02/14 2127 03/03/14 0452 03/03/14 1225  BP:  98/66 125/84   Pulse:  68 72   Temp:  98.3 F (36.8 C) 97.5 F (36.4 C)   TempSrc:  Oral Oral   Resp:  18 18   Height:      Weight:   53.797 kg (118 lb 9.6 oz)   SpO2: 94% 92% 95% 99%    Intake/Output Summary (Last 24 hours) at 03/03/14 1437 Last data filed at 03/03/14 1305  Gross per 24 hour  Intake    840 ml  Output     903 ml  Net    -63 ml   Filed Weights   03/01/14 0521 03/02/14 0534 03/03/14 0452  Weight: 50.8 kg (111 lb 15.9 oz) 52.2 kg (115 lb 1.3 oz) 53.797 kg (118 lb 9.6 oz)    Exam:  General: Alert, awake, oriented x3, in no acute distress.  HEENT: No bruits, no goiter. Heart: Regular rate and rhythm . Lungs: Good air movement, clear  Abdomen: Soft, diffuse tender, distended, positive bowel sounds.    Data Reviewed: Basic Metabolic Panel:  Recent Labs Lab 02/28/14 1127 03/01/14 0420  NA 142 144  K 3.9 3.8  CL 103 106  CO2 28 25  GLUCOSE 100* 79  BUN 14 13  CREATININE 0.65 0.67  CALCIUM 9.7 9.1  MG  --  2.2  PHOS  --  3.1   Liver Function Tests:  Recent Labs Lab 02/28/14 1802 03/01/14 0420  AST 23 22  ALT 21 20  ALKPHOS 78 78  BILITOT 0.4 0.4  PROT 6.7 6.6  ALBUMIN 3.0* 2.8*   No results found for this basename: LIPASE, AMYLASE,  in the last 168 hours No results found for this basename: AMMONIA,  in the last 168 hours CBC:  Recent Labs Lab 02/28/14 1127 03/01/14 0420  WBC 6.4 7.3  NEUTROABS 4.2  --   HGB 11.0* 11.2*  HCT 34.4* 35.0*  MCV 93.2  93.8  PLT 195 215   Cardiac Enzymes: No results found for this basename: CKTOTAL, CKMB, CKMBINDEX, TROPONINI,  in the last 168 hours BNP (last 3 results) No results found for this basename: PROBNP,  in the last 8760 hours CBG: No results found for this basename: GLUCAP,  in the last 168 hours  No results found for this or any previous visit (from the past 240 hour(s)).   Studies: Dg Abd 1 View  03/03/2014   CLINICAL DATA:  Constipation.  EXAM: ABDOMEN - 1 VIEW  COMPARISON:  March 02, 2014.  FINDINGS: There remains a large amount of stool in the right and transverse colon consistent with constipation the amount of stool seen in the left colon and rectum is improved compared to prior exam. No small bowel dilatation is noted. Postsurgical changes are noted in the right hip as well as extensive degenerative  changes in the lumbar spine.  IMPRESSION: Decreased amount of stool is noted in the left colon, but there remains a large amount of stool in the right and transverse colon consistent with constipation.   Electronically Signed   By: Roque LiasJames  Green M.D.   On: 03/03/2014 08:22   Dg Abd 1 View  03/02/2014   CLINICAL DATA:  Constipation  EXAM: ABDOMEN - 1 VIEW  COMPARISON:  03/01/2014  FINDINGS: Retained contrast in stool throughout a mildly distended colon.  No evidence of bowel obstruction or bowel wall thickening.  Excreted urinary contrast seen on previous exam no longer identified.  Scattered atherosclerotic calcifications.  Osseous demineralization with multilevel degenerative disc and facet disease changes of the thoracolumbar spine.  Prior pinning of proximal RIGHT femur with osteoarthritic changes of RIGHT hip joint.  IMPRESSION: Increased stool and retained contrast throughout colon, progressed more distally than on previous exam.   Electronically Signed   By: Ulyses SouthwardMark  Boles M.D.   On: 03/02/2014 08:28    Scheduled Meds: . atorvastatin  10 mg Oral QPM  . bisacodyl  10 mg Oral Daily  . docusate sodium  200 mg Oral Daily  . furosemide  40 mg Oral Daily  . guaiFENesin  600 mg Oral BID  . metoprolol succinate  50 mg Oral Daily  . mirtazapine  7.5 mg Oral QHS  . mometasone-formoterol  2 puff Inhalation BID  . morphine  15 mg Oral Daily  . oxybutynin  10 mg Oral QHS  . pantoprazole  40 mg Oral BID  . polyethylene glycol  17 g Oral BID  . potassium chloride  10 mEq Oral Daily  . warfarin  4 mg Oral q1800  . Warfarin - Pharmacist Dosing Inpatient   Does not apply q1800   Continuous Infusions:    [facility] [place]  [facility] If 8PM-8AM, please contact night-coverage at [email or web address], password [email or web address] 03/03/2014, 2:37 PM  LOS: 3 days

## 2014-03-03 NOTE — Progress Notes (Signed)
Pt had large BM.  

## 2014-03-03 NOTE — Progress Notes (Signed)
ANTICOAGULATION CONSULT NOTE - Initial Consult  Pharmacy Consult for Coumadin Indication: atrial fibrillation  Allergies  Allergen Reactions  . Ampicillin Other (See Comments)    Reaction Unknown   . Ciprofloxin Hcl [Ciprofloxacin Hcl] Other (See Comments)    Reaction Unknown, has received Levaquin 12/14  . Demerol Other (See Comments)    Reaction Unknown   . Sulfa Antibiotics Rash    Patient Measurements: Height: 5\' 1"  (154.9 cm) Weight: 118 lb 9.6 oz (53.797 kg) (bedscale) IBW/kg (Calculated) : 47.8 Heparin Dosing Weight:    Vital Signs: Temp: 97.5 F (36.4 C) (10/21 0452) Temp Source: Oral (10/21 0452) BP: 125/84 mmHg (10/21 0452) Pulse Rate: 72 (10/21 0452)  Labs:  Recent Labs  03/01/14 0420 03/02/14 0507 03/03/14 0414  HGB 11.2*  --   --   HCT 35.0*  --   --   PLT 215  --   --   LABPROT 29.5* 30.6* 28.5*  INR 2.77* 2.90* 2.65*  CREATININE 0.67  --   --     Estimated Creatinine Clearance: 31 ml/min (by C-G formula based on Cr of 0.67).   Medical History: Past Medical History  Diagnosis Date  . Transient ischemic attack (TIA) 1994  . Atrial fibrillation   . Hypertension   . Dyslipidemia   . Gastritis   . Vitamin B12 deficiency   . Peptic ulcer disease   . Osteopenia   . Macular degeneration, right eye   . Arthritis     back, shoulders  . Cough 05/21/2011    pt states cough x 2 days  . Edema     left leg  . Hypoglycemia   . Urinary incontinence   . CAD 06/15/2009  . Sinoatrial node dysfunction 06/15/2009    Dual-chamber PM inserted 03/18/09 [employer] Zephyr XL DR dual-chamber, seriel [id number].  . Tachy-brady syndrome     Dual-chamber PM inserted 03/18/09 St. Jude Zephyr XL DR dual-chamber, seriel [id number].  Marland Kitchen. CAD (coronary artery disease)     With LAD & RCA BMS, 2008 & LAD DES 2009 for restenosis. Both vessels patent by cath 03/18/09  . Atrial flutter   . Left heart failure   . Dyspnea     frequent  . Rectal bleeding     Aggravated by a combo of  coumadin & aspirin. D/C'ed aspirin.  . Chronic diastolic congestive heart failure   . Asthma with COPD   . GERD (gastroesophageal reflux disease)   . Irritable bowel   . Hip fracture, right   . Anorexia 08/27/12    Nearly 20 lbs weight loss over past year. Anorexia weight loss or major problems now & likely represent a slow progression/worsening of the terminal illness, perhaps GI. Probably time to start considering a change in care plan from aggresive/diagnostic to palliative (Dr. Verdis PrimeHenry Smith 08/17/12)  . Coronary atherosclerosis of native coronary artery   . Chest discomfort 08/27/12    Nearly continuous.  . Poor appetite     Medications:  Prescriptions prior to admission  Medication Sig Dispense Refill  . acetaminophen (TYLENOL) 650 MG CR tablet Take 650 mg by mouth every 4 (four) hours as needed for pain or fever.       Marland Kitchen. albuterol (PROVENTIL) (5 MG/ML) 0.5% nebulizer solution Take 0.5 mLs (2.5 mg total) by nebulization every 6 (six) hours as needed for wheezing or shortness of breath.  20 mL  12  . albuterol (VENTOLIN HFA) 108 (90 BASE) MCG/ACT inhaler Inhale 2 puffs into the lungs  every 4 (four) hours as needed for wheezing or shortness of breath. Wait 1 minute between each puff.      Marland Kitchen. aluminum & magnesium hydroxide-simethicone (MYLANTA) 500-450-40 MG/5ML suspension Take 30 mLs by mouth every 4 (four) hours as needed for indigestion.      Marland Kitchen. atorvastatin (LIPITOR) 10 MG tablet Take 10 mg by mouth every evening.       . bisacodyl (DULCOLAX) 10 MG suppository Place 10 mg rectally as needed for moderate constipation.      . Buprenorphine (BUTRANS) 7.5 MCG/HR PTWK Place 7.5 mcg onto the skin once a week.      . calcium carbonate (OS-CAL) 600 MG TABS Take 600 mg by mouth 2 (two) times daily.        . Cholecalciferol (VITAMIN D3) 1000 UNITS CAPS Take 1,000 Units by mouth 2 (two) times daily.       . Cyanocobalamin (VITAMIN B-12) 1000 MCG SUBL Place 1 tablet under the tongue daily.      Marland Kitchen.  docusate sodium (COLACE) 100 MG capsule Take 200 mg by mouth daily.      . Fluticasone-Salmeterol (ADVAIR DISKUS) 100-50 MCG/DOSE AEPB Inhale 1 puff into the lungs 2 (two) times daily. Rinse mouth after use.      . furosemide (LASIX) 40 MG tablet Take 40 mg by mouth daily.      Marland Kitchen. guaiFENesin (MUCINEX) 600 MG 12 hr tablet Take 600 mg by mouth 2 (two) times daily.      Marland Kitchen. HYDROcodone-acetaminophen (NORCO/VICODIN) 5-325 MG per tablet Take 1 tablet by mouth 4 (four) times daily as needed for moderate pain.      . magnesium hydroxide (MILK OF MAGNESIA) 400 MG/5ML suspension Take 30 mLs by mouth daily as needed for mild constipation.      . metoprolol succinate (TOPROL-XL) 50 MG 24 hr tablet Take 1 tablet (50 mg total) by mouth daily. Take with or immediately following a meal.      . mirtazapine (REMERON) 7.5 MG tablet Take 1 tablet (7.5 mg total) by mouth at bedtime.  30 tablet  0  . Multiple Vitamins-Minerals (PRESERVISION AREDS) CAPS Take 1 capsule by mouth 2 (two) times daily.      . nitrofurantoin, macrocrystal-monohydrate, (MACROBID) 100 MG capsule Take 100 mg by mouth 2 (two) times daily. For 7 days      . nitroGLYCERIN (NITRODUR - DOSED IN MG/24 HR) 0.4 mg/hr Place 1 patch onto the skin every evening. Nitrate free interval from 4pm - 8pm every day      . nitroGLYCERIN (NITROSTAT) 0.4 MG SL tablet Place 0.4 mg under the tongue every 5 (five) minutes as needed for chest pain (up to 3 doses).       . Omega-3 Fatty Acids (FISH OIL) 1000 MG CAPS Take 1,000 mg by mouth 2 (two) times daily.      Marland Kitchen. oxybutynin (DITROPAN-XL) 10 MG 24 hr tablet Take 10 mg by mouth at bedtime.      . pantoprazole (PROTONIX) 40 MG tablet Take 40 mg by mouth 2 (two) times daily.      . polyethylene glycol (MIRALAX / GLYCOLAX) packet Take 17 g by mouth daily as needed for moderate constipation.       . potassium chloride (K-DUR,KLOR-CON) 10 MEQ tablet Take 10 mEq by mouth daily.      . predniSONE (DELTASONE) 10 MG tablet Take  10-60 mg by mouth daily with breakfast. Take 60 mg by mouth on day 1, take 50 mg by  mouth on day 2, take 40 mg by mouth on day 3, take 30 mg by mouth on day 4, take 20 mg by mouth on day 5, then take 10 mg by mouth on day 6.      . warfarin (COUMADIN) 4 MG tablet Take 4 mg by mouth daily.        Assessment: Worsening severe lumbar back pain shooting down R leg.  80 y/o F presents with above complaints. She was started on prednisone 2 days prior to admission. She has also not had a BM x 5d. Found to have intra and extrahepatic biliary ductal dilitation and a distended gallbladder. She also had significant stool burden and a UTI. LFT's WNL on admit.  Anticoagulation: Afib on Coumadin PTA (4mg  daily) with admit INR 2.91. Today INR 265. Mildly anemic 11.2.  Infectious Disease: UTI via UA. No abx. Afebrile. WBC 7.3. Asymptomatic.  Cardiovascular: Afib, CHF, HTN. VSS. Meds: Lipitor10, po Lasix, Toprol XL, K+  Endocrinology: Glucose 79  Gastrointestinal / Nutrition: Severe constipation. Colace, Miralax, Sorbitol. Significant stool since yesterday but stool remains on xray. Po PPI  Neurology: Remeron, MS Contin added to replace Butrans patch.  Nephrology: Scr 0.67 pm Ditropan XL. Difficulty urinating requiring I/O cath.  Pulmonary: Mucinex, Duelra  Hematology / Oncology: Mild anemia  PTA Medication Issues: Oscal, Vit D, B12, Nitrofurantoin (CrCl on 31!--contraindicated), NTG patch, fish oil, Prednisone (not needed)   Goal of Therapy:  INR 2-3 Monitor platelets by anticoagulation protocol: Yes   Plan:  Continue Coumadin 4mg  daily. Daily INR   Teng Decou S. Merilynn Finland, PharmD, BCPS Clinical Staff Pharmacist Pager [phone]  Misty Stanley Stillinger 03/03/2014,11:34 AM

## 2014-03-04 DIAGNOSIS — M545 Low back pain, unspecified: Secondary | ICD-10-CM | POA: Diagnosis present

## 2014-03-04 DIAGNOSIS — G8929 Other chronic pain: Secondary | ICD-10-CM

## 2014-03-04 DIAGNOSIS — K5909 Other constipation: Secondary | ICD-10-CM

## 2014-03-04 LAB — PROTIME-INR
INR: 2.55 — ABNORMAL HIGH (ref 0.00–1.49)
PROTHROMBIN TIME: 27.7 s — AB (ref 11.6–15.2)

## 2014-03-04 MED ORDER — DICLOFENAC SODIUM 1 % TD GEL: 2.0000 g | Freq: Four times a day (QID) | TRANSDERMAL | Status: DC

## 2014-03-04 MED ORDER — SENNOSIDES-DOCUSATE SODIUM 8.6-50 MG PO TABS: 2.0000 | ORAL_TABLET | Freq: Every day | ORAL | Status: DC

## 2014-03-04 MED ORDER — DICLOFENAC SODIUM 1 % TD GEL
2.0000 g | Freq: Four times a day (QID) | TRANSDERMAL | Status: DC
  Administered 2014-03-04 (×2): 2 g via TOPICAL
  Filled 2014-03-04: qty 100

## 2014-03-04 MED ORDER — OXYCODONE HCL 5 MG PO TABS: 5.0000 mg | ORAL_TABLET | ORAL | Status: DC | PRN

## 2014-03-04 MED ORDER — MAGNESIUM HYDROXIDE 400 MG/5ML PO SUSP: 30.0000 mL | Freq: Every day | ORAL | Status: DC

## 2014-03-04 MED ORDER — MORPHINE SULFATE ER 15 MG PO TBCR: 15.0000 mg | EXTENDED_RELEASE_TABLET | Freq: Two times a day (BID) | ORAL | Status: DC

## 2014-03-04 NOTE — Progress Notes (Signed)
Clinical Social Worker facilitated patient discharge including contacting patient family and facility to confirm patient discharge plans.  [facility]. Discussed with patient, daughter Okey RegalCarol and son-in-law.  Daughter transported via car. RN called report prior to discharge. Clinical Social Worker will sign off social work intervention is no longer needed. Please consult us again if new need arises  Lovette ClicheDonna Everlie Eble, LCSW [phone]

## 2014-03-04 NOTE — Progress Notes (Signed)
ANTICOAGULATION CONSULT NOTE - Initial Consult  Pharmacy Consult for Coumadin Indication: atrial fibrillation  Allergies  Allergen Reactions  . Ampicillin Other (See Comments)    Reaction Unknown   . Ciprofloxin Hcl [Ciprofloxacin Hcl] Other (See Comments)    Reaction Unknown, has received Levaquin 12/14  . Demerol Other (See Comments)    Reaction Unknown   . Sulfa Antibiotics Rash    Patient Measurements: Height: 5\' 1"  (154.9 cm) Weight: 112 lb 10.5 oz (51.1 kg) (bedscale) IBW/kg (Calculated) : 47.8 Heparin Dosing Weight:    Vital Signs: Temp: 98 F (36.7 C) (10/22 0604) Temp Source: Oral (10/22 0604) BP: 104/60 mmHg (10/22 0604) Pulse Rate: 70 (10/22 0604)  Labs:  Recent Labs  03/02/14 0507 03/03/14 0414 03/04/14 0451  LABPROT 30.6* 28.5* 27.7*  INR 2.90* 2.65* 2.55*    Estimated Creatinine Clearance: 31 ml/min (by C-G formula based on Cr of 0.67).   Medical History: Past Medical History  Diagnosis Date  . Transient ischemic attack (TIA) 1994  . Atrial fibrillation   . Hypertension   . Dyslipidemia   . Gastritis   . Vitamin B12 deficiency   . Peptic ulcer disease   . Osteopenia   . Macular degeneration, right eye   . Arthritis     back, shoulders  . Cough 05/21/2011    pt states cough x 2 days  . Edema     left leg  . Hypoglycemia   . Urinary incontinence   . CAD 06/15/2009  . Sinoatrial node dysfunction 06/15/2009    Dual-chamber PM inserted 03/18/09 [employer] Zephyr XL DR dual-chamber, seriel [id number].  . Tachy-brady syndrome     Dual-chamber PM inserted 03/18/09 [employer] Zephyr XL DR dual-chamber, seriel [id number].  Marland Kitchen CAD (coronary artery disease)     With LAD & RCA BMS, 2008 & LAD DES 2009 for restenosis. Both vessels patent by cath 03/18/09  . Atrial flutter   . Left heart failure   . Dyspnea     frequent  . Rectal bleeding     Aggravated by a combo of coumadin & aspirin. D/C'ed aspirin.  . Chronic diastolic congestive heart failure   . Asthma  with COPD   . GERD (gastroesophageal reflux disease)   . Irritable bowel   . Hip fracture, right   . Anorexia 08/27/12    Nearly 20 lbs weight loss over past year. Anorexia weight loss or major problems now & likely represent a slow progression/worsening of the terminal illness, perhaps GI. Probably time to start considering a change in care plan from aggresive/diagnostic to palliative (Dr. Verdis Prime 08/17/12)  . Coronary atherosclerosis of native coronary artery   . Chest discomfort 08/27/12    Nearly continuous.  . Poor appetite     Medications:  Prescriptions prior to admission  Medication Sig Dispense Refill  . acetaminophen (TYLENOL) 650 MG CR tablet Take 650 mg by mouth every 4 (four) hours as needed for pain or fever.       Marland Kitchen albuterol (PROVENTIL) (5 MG/ML) 0.5% nebulizer solution Take 0.5 mLs (2.5 mg total) by nebulization every 6 (six) hours as needed for wheezing or shortness of breath.  20 mL  12  . albuterol (VENTOLIN HFA) 108 (90 BASE) MCG/ACT inhaler Inhale 2 puffs into the lungs every 4 (four) hours as needed for wheezing or shortness of breath. Wait 1 minute between each puff.      Marland Kitchen aluminum & magnesium hydroxide-simethicone (MYLANTA) 500-450-40 MG/5ML suspension Take 30 mLs by  mouth every 4 (four) hours as needed for indigestion.      Marland Kitchen. atorvastatin (LIPITOR) 10 MG tablet Take 10 mg by mouth every evening.       . bisacodyl (DULCOLAX) 10 MG suppository Place 10 mg rectally as needed for moderate constipation.      . Buprenorphine (BUTRANS) 7.5 MCG/HR PTWK Place 7.5 mcg onto the skin once a week.      . calcium carbonate (OS-CAL) 600 MG TABS Take 600 mg by mouth 2 (two) times daily.        . Cholecalciferol (VITAMIN D3) 1000 UNITS CAPS Take 1,000 Units by mouth 2 (two) times daily.       . Cyanocobalamin (VITAMIN B-12) 1000 MCG SUBL Place 1 tablet under the tongue daily.      Marland Kitchen. docusate sodium (COLACE) 100 MG capsule Take 200 mg by mouth daily.      . Fluticasone-Salmeterol  (ADVAIR DISKUS) 100-50 MCG/DOSE AEPB Inhale 1 puff into the lungs 2 (two) times daily. Rinse mouth after use.      . furosemide (LASIX) 40 MG tablet Take 40 mg by mouth daily.      Marland Kitchen. guaiFENesin (MUCINEX) 600 MG 12 hr tablet Take 600 mg by mouth 2 (two) times daily.      Marland Kitchen. HYDROcodone-acetaminophen (NORCO/VICODIN) 5-325 MG per tablet Take 1 tablet by mouth 4 (four) times daily as needed for moderate pain.      . metoprolol succinate (TOPROL-XL) 50 MG 24 hr tablet Take 1 tablet (50 mg total) by mouth daily. Take with or immediately following a meal.      . mirtazapine (REMERON) 7.5 MG tablet Take 1 tablet (7.5 mg total) by mouth at bedtime.  30 tablet  0  . Multiple Vitamins-Minerals (PRESERVISION AREDS) CAPS Take 1 capsule by mouth 2 (two) times daily.      . nitrofurantoin, macrocrystal-monohydrate, (MACROBID) 100 MG capsule Take 100 mg by mouth 2 (two) times daily. For 7 days      . nitroGLYCERIN (NITRODUR - DOSED IN MG/24 HR) 0.4 mg/hr Place 1 patch onto the skin every evening. Nitrate free interval from 4pm - 8pm every day      . nitroGLYCERIN (NITROSTAT) 0.4 MG SL tablet Place 0.4 mg under the tongue every 5 (five) minutes as needed for chest pain (up to 3 doses).       . Omega-3 Fatty Acids (FISH OIL) 1000 MG CAPS Take 1,000 mg by mouth 2 (two) times daily.      Marland Kitchen. oxybutynin (DITROPAN-XL) 10 MG 24 hr tablet Take 10 mg by mouth at bedtime.      . pantoprazole (PROTONIX) 40 MG tablet Take 40 mg by mouth 2 (two) times daily.      . polyethylene glycol (MIRALAX / GLYCOLAX) packet Take 17 g by mouth daily as needed for moderate constipation.       . potassium chloride (K-DUR,KLOR-CON) 10 MEQ tablet Take 10 mEq by mouth daily.      . predniSONE (DELTASONE) 10 MG tablet Take 10-60 mg by mouth daily with breakfast. Take 60 mg by mouth on day 1, take 50 mg by mouth on day 2, take 40 mg by mouth on day 3, take 30 mg by mouth on day 4, take 20 mg by mouth on day 5, then take 10 mg by mouth on day 6.       . warfarin (COUMADIN) 4 MG tablet Take 4 mg by mouth daily.      . [DISCONTINUED]  magnesium hydroxide (MILK OF MAGNESIA) 400 MG/5ML suspension Take 30 mLs by mouth daily as needed for mild constipation.        Assessment: Worsening severe lumbar back pain shooting down R leg.  80 y/o F presents with above complaints. She was started on prednisone 2 days prior to admission. She has also not had a BM x 5d. Found to have intra and extrahepatic biliary ductal dilitation and a distended gallbladder. She also had significant stool burden and a UTI. LFT's WNL on admit.  Anticoagulation: Afib on Coumadin PTA (4mg  daily) with admit INR 2.91. Today INR 2.55. Mildly anemic 11.2.  Infectious Disease: UTI via UA. No abx. Afebrile. WBC 7.3. Asymptomatic.  Cardiovascular: Afib, CHF, HTN. VSS. Meds: Lipitor10, po Lasix, Toprol XL, K+  Endocrinology: Glucose 79  Gastrointestinal / Nutrition: Severe constipation. Colace, Miralax, Sorbitol. Po PPI.   Neurology: Remeron, MS Contin added to replace Butrans patch.  Nephrology: Scr 0.67 pm Ditropan XL. Difficulty urinating requiring I/O cath.  Pulmonary: Mucinex, Duelra  Hematology / Oncology: Mild anemia  PTA Medication Issues: Oscal, Vit D, B12, Nitrofurantoin (CrCl on 31!--contraindicated), NTG patch, fish oil, Prednisone (not needed)   Goal of Therapy:  INR 2-3 Monitor platelets by anticoagulation protocol: Yes   Plan:  Continue Coumadin 4mg  daily. Daily INR   [name], PharmD, [facility] CenterBCPS Clinical Staff Pharmacist Pager [phone]  [name], [name] 03/04/2014,10:03 AM

## 2014-03-04 NOTE — Clinical Social Work Psychosocial (Addendum)
Clinical Social Work Department BRIEF PSYCHOSOCIAL ASSESSMENT 03/03/2014  Patient:  [name], [name]     Account Number:  [id number]     Admit date:  02/28/2014  Clinical Social Worker:  Elam Dutch  Date/Time:  03/03/2014 11:50 AM  Referred by:  Physician  Date Referred:  03/03/2014 Referred for  SNF Placement   Other Referral:   Interview type:  Patient Other interview type:    PSYCHOSOCIAL DATA Living Status:  FACILITY Admitted from facility:   Level of care:  Assisted Living Primary support name:  [name]  [phone] Primary support relationship to patient:  CHILD, ADULT Degree of support available:   Strong support    CURRENT CONCERNS Current Concerns  Post-Acute Placement   Other Concerns:   Patient wants to return to ALF if at all possible- doesn't want to go to SNF    [place] / PLAN CSW met with patient today. 80 year old female- resident of [facility]. Patient noted to be very pleasant and open to CSW's visit. She stated that she loves living at [facility] and is hopeful that she can return to her room in the ALF. CSW discussed the possibility of needing short term SNF and she agreed to go "only if I have to." CSW spoke with [name], Admissions at [facility]. She stated that they have very limited help on the ALF unit and would prefer she return SNF. Physical Therapy states either ALF or SNF as indicated.  Fl2 completed and placed on chart.   Assessment/plan status:  Psychosocial Support/Ongoing Assessment of Needs Other assessment/ plan:   Information/referral to community resources:   SNF list deferred by patient as she would return to [facility] either at ALF level or SNF.    PATIENT'S/FAMILY'S RESPONSE TO PLAN OF CARE: Patient is alert, oriented and extremely pleasant.  She stated that was feeling better and hoping for d/c soon. CSW discussed possible SNF at [facility] and while reluctant, she is  agreeable if necessary. CSW discussed feasibility of short term rehab and then return to ALF and she was happy with this. Patient related that her daughter [name] is very supportive and invovled in her care and it is ok to discuss any matters with her. CSW will notify daughter of tentative d/c plan.

## 2014-03-04 NOTE — Discharge Summary (Addendum)
Physician Discharge Summary  [name] ZOX:[id number] DOB: [birth year] DOA: 02/28/2014  PCP: Londell Moh, MD  Admit date: 02/28/2014 Discharge date: 03/04/2014  Time spent: >45 minutes  Recommendations for Outpatient Follow-up:  1. Needs f/u with Dr Ethelene Hal in regards to back pain 2. Will be discharged to skilled nursing and once able to, will return to assisted living 3. Monitor for recurrence of constipation and adjust bowel regimen as needed  Discharge Condition: stable Diet recommendation: heart healthy  Discharge Diagnoses:  Principal Problem:   RUQ abdominal pain Active Problems:   Acute exacerbation of chronic low back pain   Essential hypertension   Coronary atherosclerosis   Long-term (current) use of anticoagulants   Chronic diastolic CHF (congestive heart failure)   UTI (lower urinary tract infection)   A-fib   History of present illness:  This is a 80 year old female with a past medical history of TIA, hypertension, atrial fibrillation, chronic back pain who presented to the hospital with uncontrolled back pain and right upper quarter in pain. The patient was found to be severely constipated with an extensive amount of stool all throughout her colon and was admitted to help clear this and also to help manage an acute exacerbation of her chronic back pain.  Hospital Course:  Abdominal pain  - Dilated duct on [place], Murphy sign negative.  Her pain was more diffuse. LFT's within normal.  -Likely due to severe constipation -Placed on aggressive bowel regimen with clearing of constipation and resolution of right upper quadrant pain.  -She's been placed on a bowel regimen of Senna/Docusate at bedtime and MiraLAX in the morning with when necessary Dulcolax -  this is to be modified as needed to allow the patient to have a bowel movement daily to help prevent recurrence of constipation especially as she is now going to be on narcotics for control of her back  pain  Acute on Chronic back pain  - gets "injections in her back" per her family by Dr Bonna Gains one was in January and it appears to have worn off - her daughter will call to have her scheduled for another one as soon as possible as her back pain is uncontrolled despite use of narcotics  - During this hospital stay we have started MS Contin every 12 hours, PRN oxycodone and Voltaren gel -despite these measures she continues to have mild to moderate amount of pain in her back and as recommended above will need to followup with Dr. Algis Greenhouse as soon as possible  UTI ? - Many bacteria and small leukocytes noted on UA on admission however, she has no leukocytosis on CBC, no supra pubic tenderness, dysuria or urgency.  -She was initially placed on antibiotics on the day of admission but due to lack of symptoms, these have been discontinued  A-fib:  - on coumadin.  - rate controlled.   Chronic diastolic CHF (congestive heart failure):  - compensated- on Lasix and Metoprolol   Essential hypertension  - stable.    Procedures:  None  Consultations:  None  Discharge Exam: Filed Weights   03/02/14 0534 03/03/14 0452 03/04/14 0604  Weight: 52.2 kg (115 lb 1.3 oz) 53.797 kg (118 lb 9.6 oz) 51.1 kg (112 lb 10.5 oz)   Filed Vitals:   03/04/14 0604  BP: 104/60  Pulse: 70  Temp: 98 F (36.7 C)  Resp: 16    General: AAO x 3, no distress Cardiovascular: RRR, no murmurs  Respiratory: clear to auscultation bilaterally GI: soft, non-tender,  non-distended, bowel sound positive Back: Mild tenderness noted in the sacral area at the midline and just to the right of the midline  Discharge Instructions You were cared for by a hospitalist during your hospital stay. If you have any questions about your discharge medications or the care you received while you were in the hospital after you are discharged, you can call the unit and asked to speak with the hospitalist on call if the hospitalist that  took care of you is not available. Once you are discharged, your primary care physician will handle any further medical issues. Please note that NO REFILLS for any discharge medications will be authorized once you are discharged, as it is imperative that you return to your primary care physician (or establish a relationship with a primary care physician if you do not have one) for your aftercare needs so that they can reassess your need for medications and monitor your lab values.     Medication List    STOP taking these medications       docusate sodium 100 MG capsule  Commonly known as:  COLACE     HYDROcodone-acetaminophen 5-325 MG per tablet  Commonly known as:  NORCO/VICODIN      TAKE these medications       acetaminophen 650 MG CR tablet  Commonly known as:  TYLENOL  Take 650 mg by mouth every 4 (four) hours as needed for pain or fever.     ADVAIR DISKUS 100-50 MCG/DOSE Aepb  Generic drug:  Fluticasone-Salmeterol  Inhale 1 puff into the lungs 2 (two) times daily. Rinse mouth after use.     aluminum & magnesium hydroxide-simethicone 500-450-40 MG/5ML suspension  Commonly known as:  MYLANTA  Take 30 mLs by mouth every 4 (four) hours as needed for indigestion.     atorvastatin 10 MG tablet  Commonly known as:  LIPITOR  Take 10 mg by mouth every evening.     bisacodyl 10 MG suppository  Commonly known as:  DULCOLAX  Place 10 mg rectally as needed for moderate constipation.     BUTRANS 7.5 MCG/HR Ptwk  Generic drug:  Buprenorphine  Place 7.5 mcg onto the skin once a week.     calcium carbonate 600 MG Tabs tablet  Commonly known as:  OS-CAL  Take 600 mg by mouth 2 (two) times daily.     diclofenac sodium 1 % Gel  Commonly known as:  VOLTAREN  Apply 2 g topically 4 (four) times daily.     Fish Oil 1000 MG Caps  Take 1,000 mg by mouth 2 (two) times daily.     furosemide 40 MG tablet  Commonly known as:  LASIX  Take 40 mg by mouth daily.     guaiFENesin 600 MG 12  hr tablet  Commonly known as:  MUCINEX  Take 600 mg by mouth 2 (two) times daily.     magnesium hydroxide 400 MG/5ML suspension  Commonly known as:  MILK OF MAGNESIA  Take 30 mLs by mouth daily.     metoprolol succinate 50 MG 24 hr tablet  Commonly known as:  TOPROL-XL  Take 1 tablet (50 mg total) by mouth daily. Take with or immediately following a meal.     mirtazapine 7.5 MG tablet  Commonly known as:  REMERON  Take 1 tablet (7.5 mg total) by mouth at bedtime.     morphine 15 MG 12 hr tablet  Commonly known as:  MS CONTIN  Take 1 tablet (15 mg  total) by mouth every 12 (twelve) hours.     nitrofurantoin (macrocrystal-monohydrate) 100 MG capsule  Commonly known as:  MACROBID  Take 100 mg by mouth 2 (two) times daily. For 7 days     nitroGLYCERIN 0.4 MG SL tablet  Commonly known as:  NITROSTAT  Place 0.4 mg under the tongue every 5 (five) minutes as needed for chest pain (up to 3 doses).     nitroGLYCERIN 0.4 mg/hr patch  Commonly known as:  NITRODUR - Dosed in mg/24 hr  Place 1 patch onto the skin every evening. Nitrate free interval from 4pm - 8pm every day     oxybutynin 10 MG 24 hr tablet  Commonly known as:  DITROPAN-XL  Take 10 mg by mouth at bedtime.     oxyCODONE 5 MG immediate release tablet  Commonly known as:  Oxy IR/ROXICODONE  Take 1 tablet (5 mg total) by mouth every 4 (four) hours as needed for severe pain.     pantoprazole 40 MG tablet  Commonly known as:  PROTONIX  Take 40 mg by mouth 2 (two) times daily.     polyethylene glycol packet  Commonly known as:  MIRALAX / GLYCOLAX  Take 17 g by mouth daily as needed for moderate constipation.     potassium chloride 10 MEQ tablet  Commonly known as:  K-DUR,KLOR-CON  Take 10 mEq by mouth daily.     predniSONE 10 MG tablet  Commonly known as:  DELTASONE  Take 10-60 mg by mouth daily with breakfast. Take 60 mg by mouth on day 1, take 50 mg by mouth on day 2, take 40 mg by mouth on day 3, take 30 mg by  mouth on day 4, take 20 mg by mouth on day 5, then take 10 mg by mouth on day 6.     PRESERVISION AREDS Caps  Take 1 capsule by mouth 2 (two) times daily.     senna-docusate 8.6-50 MG per tablet  Commonly known as:  Senokot-S  Take 2 tablets by mouth at bedtime.     VENTOLIN HFA 108 (90 BASE) MCG/ACT inhaler  Generic drug:  albuterol  Inhale 2 puffs into the lungs every 4 (four) hours as needed for wheezing or shortness of breath. Wait 1 minute between each puff.     albuterol (5 MG/ML) 0.5% nebulizer solution  Commonly known as:  PROVENTIL  Take 0.5 mLs (2.5 mg total) by nebulization every 6 (six) hours as needed for wheezing or shortness of breath.     Vitamin B-12 1000 MCG Subl  Place 1 tablet under the tongue daily.     Vitamin D3 1000 UNITS Caps  Take 1,000 Units by mouth 2 (two) times daily.     warfarin 4 MG tablet  Commonly known as:  COUMADIN  Take 4 mg by mouth daily.       Allergies  Allergen Reactions  . Ampicillin Other (See Comments)    Reaction Unknown   . Ciprofloxin Hcl [Ciprofloxacin Hcl] Other (See Comments)    Reaction Unknown, has received Levaquin 12/14  . Demerol Other (See Comments)    Reaction Unknown   . Sulfa Antibiotics Rash      The results of significant diagnostics from this hospitalization (including imaging, microbiology, ancillary and laboratory) are listed below for reference.    Significant Diagnostic Studies: Dg Abd 1 View  03/03/2014   CLINICAL DATA:  Constipation.  EXAM: ABDOMEN - 1 VIEW  COMPARISON:  March 02, 2014.  FINDINGS:  There remains a large amount of stool in the right and transverse colon consistent with constipation the amount of stool seen in the left colon and rectum is improved compared to prior exam. No small bowel dilatation is noted. Postsurgical changes are noted in the right hip as well as extensive degenerative changes in the lumbar spine.  IMPRESSION: Decreased amount of stool is noted in the left colon, but  there remains a large amount of stool in the right and transverse colon consistent with constipation.   Electronically Signed   By: Roque Lias M.D.   On: 03/03/2014 08:22   Dg Abd 1 View  03/02/2014   CLINICAL DATA:  Constipation  EXAM: ABDOMEN - 1 VIEW  COMPARISON:  03/01/2014  FINDINGS: Retained contrast in stool throughout a mildly distended colon.  No evidence of bowel obstruction or bowel wall thickening.  Excreted urinary contrast seen on previous exam no longer identified.  Scattered atherosclerotic calcifications.  Osseous demineralization with multilevel degenerative disc and facet disease changes of the thoracolumbar spine.  Prior pinning of proximal RIGHT femur with osteoarthritic changes of RIGHT hip joint.  IMPRESSION: Increased stool and retained contrast throughout colon, progressed more distally than on previous exam.   Electronically Signed   By: Ulyses Southward M.D.   On: 03/02/2014 08:28   Abd 1 View (kub)  03/01/2014   CLINICAL DATA:  Abdominal pain and constipation  EXAM: ABDOMEN - 1 VIEW  COMPARISON:  02/28/1949  FINDINGS: Scattered large and small bowel gas is noted. Contrast material is noted throughout the colon from recent CT examination. The bladder is also to well distended with contrast enhanced urine. Moderate fecal material is noted within the colon. No obstructive changes are seen. No free air is noted. Degenerative change of the lumbar spine is noted. Postsurgical change in the right hip is seen.  IMPRESSION: Fecal material within the colon without obstructive change. The overall appearance is stable from the prior study.   Electronically Signed   By: Alcide Clever M.D.   On: 03/01/2014 10:54   Ct Abdomen Pelvis W Contrast  02/28/2014   CLINICAL DATA:  Back pain, abdominal pain  EXAM: CT ABDOMEN AND PELVIS WITH CONTRAST  TECHNIQUE: Multidetector CT imaging of the abdomen and pelvis was performed using the standard protocol following bolus administration of intravenous  contrast.  CONTRAST:  OMNIPAQUE IOHEXOL 300 MG/ML  SOLN  COMPARISON:  11/01/2011  FINDINGS: There is a small left pleural effusion. There is bibasilar airspace disease.  There are stable right and left hepatic lobe low-density lesions likely representing small cysts or hamartomas. There is gallbladder distention. There is intrahepatic and extrahepatic biliary ductal dilatation. The common bile duct measures 17 mm in diameter. The spleen demonstrates no focal abnormality.There is stable bilateral renal cysts. There is a nonobstructing left renal calculus. The adrenal glands are normal. The pancreas is atrophic without focal abnormality. The bladder is unremarkable.  The stomach, duodenum, small intestine, and large intestine demonstrate no contrast extravasation or dilatation. There is a large amount of stool throughout the colon. There is a small bowel containing right inguinal hernia. There is no pneumoperitoneum, pneumatosis, or portal venous gas. There is no abdominal or pelvic free fluid. There is no lymphadenopathy.  The abdominal aorta is normal in caliber with atherosclerosis.  There are no lytic or sclerotic osseous lesions. There is diffuse lumbar spine spondylosis. Severe osteoarthritis of the right hip.  IMPRESSION: 1. Large amount of stool throughout the colon. 2. Distended gallbladder  with intrahepatic and extrahepatic biliary ductal dilatation. No obstructing mass is identified. 3. Small left pleural effusion. Bibasilar airspace disease likely representing atelectasis versus developing infection. 4. Lumbar spine spondylosis. 5. Abdominal aortic atherosclerosis. 6. Right inguinal hernia containing nondilated small bowel.   Electronically Signed   By: Elige Ko   On: 02/28/2014 17:05   Dg Abd Acute W/chest  02/28/2014   CLINICAL DATA:  Worsening low back pain for the past week, not improved with a back injection.  EXAM: ACUTE ABDOMEN SERIES (ABDOMEN 2 VIEW & CHEST 1 VIEW)  COMPARISON:  Chest  dated 11/19/2013  FINDINGS: Grossly stable enlarged cardiac silhouette and prominent interstitial markings. Interval mild bibasilar atelectasis. Stable left subclavian pacemaker leads. Diffuse osteopenia and marked bilateral shoulder degenerative changes. Upper abdominal surgical clips. Normal bowel gas pattern without free peritoneal air. Prominent stool in the colon. Marked lumbar spine degenerative changes and lesser lower thoracic spine degenerative changes including changes of DISH. Right hip fixation screws.  IMPRESSION: 1. Mild bibasilar atelectasis. 2. Stable cardiomegaly and chronic interstitial lung disease. 3. Prominent stool. 4. Marked bilateral shoulder and lumbar spine degenerative changes.   Electronically Signed   By: Gordan Payment M.D.   On: 02/28/2014 12:44   US Abdomen Limited Ruq  02/28/2014   CLINICAL DATA:  Right upper quadrant pain. Ascites. Previous history of perforated ulcer.  EXAM: US ABDOMEN LIMITED - RIGHT UPPER QUADRANT  COMPARISON:  CT abdomen and pelvis 02/28/2014  FINDINGS: Gallbladder:  The gallbladder is mildly distended with evidence of sludge layering in the gallbladder. No discrete stones. No wall thickening. Murphy's sign is negative.  Common bile duct:  Diameter: 16 mm. Moderate intrahepatic and extrahepatic bile duct dilatation is noted. No intraductal stone is demonstrated although the distal common bile duct is obscured by bowel gas.  Liver:  No focal lesion identified. Within normal limits in parenchymal echogenicity.  IMPRESSION: Moderate intra and extrahepatic bile duct dilatation. Cause is not identified. Mild gallbladder distention and sludge. No stones or inflammatory changes suggested in the gallbladder.   Electronically Signed   By: Burman Nieves M.D.   On: 02/28/2014 22:38    Microbiology: No results found for this or any previous visit (from the past 240 hour(s)).   Labs: Basic Metabolic Panel:  Recent Labs Lab 02/28/14 1127 03/01/14 0420  NA 142  144  K 3.9 3.8  CL 103 106  CO2 28 25  GLUCOSE 100* 79  BUN 14 13  CREATININE 0.65 0.67  CALCIUM 9.7 9.1  MG  --  2.2  PHOS  --  3.1   Liver Function Tests:  Recent Labs Lab 02/28/14 1802 03/01/14 0420  AST 23 22  ALT 21 20  ALKPHOS 78 78  BILITOT 0.4 0.4  PROT 6.7 6.6  ALBUMIN 3.0* 2.8*   No results found for this basename: LIPASE, AMYLASE,  in the last 168 hours No results found for this basename: AMMONIA,  in the last 168 hours CBC:  Recent Labs Lab 02/28/14 1127 03/01/14 0420  WBC 6.4 7.3  NEUTROABS 4.2  --   HGB 11.0* 11.2*  HCT 34.4* 35.0*  MCV 93.2 93.8  PLT 195 215   Cardiac Enzymes: No results found for this basename: CKTOTAL, CKMB, CKMBINDEX, TROPONINI,  in the last 168 hours BNP: BNP (last 3 results) No results found for this basename: PROBNP,  in the last 8760 hours CBG: No results found for this basename: GLUCAP,  in the last 168 hours  SignedCalvert Cantor:  Oiva Dibari, MD [facility] 03/04/2014, 10:50 AM

## 2014-03-05 ENCOUNTER — Encounter: Payer: Self-pay | Admitting: Nurse Practitioner

## 2014-03-05 ENCOUNTER — Non-Acute Institutional Stay (SKILLED_NURSING_FACILITY): Payer: Medicare Other | Admitting: Nurse Practitioner

## 2014-03-05 DIAGNOSIS — N390 Urinary tract infection, site not specified: Secondary | ICD-10-CM

## 2014-03-05 DIAGNOSIS — K5900 Constipation, unspecified: Secondary | ICD-10-CM

## 2014-03-05 DIAGNOSIS — I480 Paroxysmal atrial fibrillation: Secondary | ICD-10-CM

## 2014-03-05 DIAGNOSIS — F329 Major depressive disorder, single episode, unspecified: Secondary | ICD-10-CM | POA: Insufficient documentation

## 2014-03-05 DIAGNOSIS — F32A Depression, unspecified: Secondary | ICD-10-CM

## 2014-03-05 DIAGNOSIS — I10 Essential (primary) hypertension: Secondary | ICD-10-CM

## 2014-03-05 DIAGNOSIS — R1011 Right upper quadrant pain: Secondary | ICD-10-CM

## 2014-03-05 DIAGNOSIS — G8929 Other chronic pain: Secondary | ICD-10-CM

## 2014-03-05 DIAGNOSIS — M545 Low back pain: Secondary | ICD-10-CM

## 2014-03-05 DIAGNOSIS — I5032 Chronic diastolic (congestive) heart failure: Secondary | ICD-10-CM

## 2014-03-05 NOTE — Assessment & Plan Note (Signed)
Many bacteria and small leukocytes noted on UA on admission however, she has no leukocytosis on CBC, no supra pubic tenderness, dysuria or urgency.  -She was initially placed on antibiotics on the day of admission but due to lack of symptoms, these have been discontinued

## 2014-03-05 NOTE — Assessment & Plan Note (Signed)
-   Dilated duct on [place], Murphy sign negative. Her pain was more diffuse. LFT's within normal.  -Likely due to severe constipation  -Placed on aggressive bowel regimen with clearing of constipation and resolution of right upper quadrant pain.

## 2014-03-05 NOTE — Assessment & Plan Note (Signed)
Adding MiraLax daily in addition to Senna S II qhs.

## 2014-03-05 NOTE — Assessment & Plan Note (Addendum)
-  f/u with Dr Ethelene Halamos in regards to back pain- gets "injections in her back" per her family by Dr Bonna Gainsamos-last one was in January and it appears to have worn off  - her daughter will call to have her scheduled for another one as soon as possible as her back pain is uncontrolled despite use of narcotics  - During this hospital stay we have started MS Contin every 12 hours, PRN oxycodone and Voltaren gel -despite these measures she continues to have mild to moderate amount of pain in her back and as recommended above will need to followup with Dr. Algis Greenhouseamo as soon as possible   03/05/14 continue Butrans, Morphine, and prn Oxycodone-will assess pain q4hr for now. Complete Prednisone taper dose.

## 2014-03-05 NOTE — Assessment & Plan Note (Signed)
-   compensated- on Lasix and Metoprolol

## 2014-03-05 NOTE — Assessment & Plan Note (Signed)
Controlled.  

## 2014-03-05 NOTE — Progress Notes (Signed)
Patient ID: [name], female   DOB: 06-16-1915, 80 y.o.   MRN: [id number]   Code Status: Full Code  Allergies  Allergen Reactions  . Ampicillin Other (See Comments)    Reaction Unknown   . Ciprofloxin Hcl [Ciprofloxacin Hcl] Other (See Comments)    Reaction Unknown, has received Levaquin 12/14  . Demerol Other (See Comments)    Reaction Unknown   . Sulfa Antibiotics Rash    Chief Complaint  Patient presents with  . Medical Management of Chronic Issues  . Acute Visit    lower back pain, constipation  . Hospitalization Follow-up    HPI: Patient is a 80 y.o. female seen in the SN[facility] today for evaluation of persisted lower back pain and other chronic medical conditions.    Hospitalized 02/28/2014-03/04/2014 Acute exacerbation of chronic low back pain and severe constipation.    Hospitalized from 05/04/2013 to 05/11/2013 for SOB, PNA, CHF, generalized weakness. She has a long standing hx of atrial fibrillation, on coumadin, pacemaker, CAD with multiple cardiac stents, history of TIA's, dyslipidemia. CXR in ED  revealed cardiomegaly and interstitial opacity at bases, mild CHF, less likely pneumonia. Because of the fever and hypoxia pt was started on broad spectrum antibiotics for HCAP. Pt has respiratory cultures positive for pseudomonas sensitive to cipro. I spoke with infectious disease, Dr. Orvan Falconerampbell and he recommended treating with total of 8 days (including what she has gotten in hospital). SOB at rest today Problem List Items Addressed This Visit   UTI (lower urinary tract infection)      Many bacteria and small leukocytes noted on UA on admission however, she has no leukocytosis on CBC, no supra pubic tenderness, dysuria or urgency.  -She was initially placed on antibiotics on the day of admission but due to lack of symptoms, these have been discontinued      RUQ abdominal pain     - Dilated duct on US, Murphy sign negative. Her pain was more diffuse.  LFT's within normal.  -Likely due to severe constipation  -Placed on aggressive bowel regimen with clearing of constipation and resolution of right upper quadrant pain.      Paroxysmal atrial fibrillation     - on coumadin. PT/INR 03/05/14 pending.  - rate controlled.     Essential hypertension     Controlled.     Depression     Continue Mirtazapine 7.5mg  po qhs.     Constipation     Adding MiraLax daily in addition to Senna S II qhs.     Chronic diastolic CHF (congestive heart failure)     - compensated- on Lasix and Metoprolol     Acute exacerbation of chronic low back pain - Primary     -f/u with Dr Ethelene Halamos in regards to back pain- gets "injections in her back" per her family by Dr Bonna Gainsamos-last one was in January and it appears to have worn off  - her daughter will call to have her scheduled for another one as soon as possible as her back pain is uncontrolled despite use of narcotics  - During this hospital stay we have started MS Contin every 12 hours, PRN oxycodone and Voltaren gel -despite these measures she continues to have mild to moderate amount of pain in her back and as recommended above will need to followup with Dr. Algis Greenhouseamo as soon as possible   03/05/14 continue Butrans, Morphine, and prn Oxycodone-will assess pain q4hr for now. Complete Prednisone taper dose.  Review of Systems:  Review of Systems  Constitutional: Positive for malaise/fatigue. Negative for fever, chills, weight loss and diaphoresis.  HENT: Positive for hearing loss. Negative for congestion, ear discharge, ear pain, nosebleeds, sore throat and tinnitus.   Eyes: Negative for blurred vision, double vision, photophobia, pain, discharge and redness.  Respiratory: Positive for shortness of breath. Negative for cough, hemoptysis, sputum production, wheezing and stridor.   Cardiovascular: Positive for leg swelling and PND. Negative for chest pain, palpitations, orthopnea and claudication.       Trace    Gastrointestinal: Positive for constipation. Negative for heartburn, nausea, vomiting, abdominal pain, diarrhea, blood in stool and melena.  Genitourinary: Positive for frequency. Negative for dysuria, urgency, hematuria and flank pain.  Musculoskeletal: Positive for back pain. Negative for falls, joint pain, myalgias and neck pain.  Skin: Negative for itching and rash.       Coccyx area small scabbed over area from previous pressure injury.   Neurological: Positive for weakness. Negative for dizziness, tingling, tremors, sensory change, speech change, focal weakness, seizures, loss of consciousness and headaches.  Endo/Heme/Allergies: Negative for environmental allergies and polydipsia. Bruises/bleeds easily.  Psychiatric/Behavioral: Negative for depression, suicidal ideas, hallucinations, memory loss and substance abuse. The patient is not nervous/anxious and does not have insomnia.      Past Medical History  Diagnosis Date  . Transient ischemic attack (TIA) 1994  . Atrial fibrillation   . Hypertension   . Dyslipidemia   . Gastritis   . Vitamin B12 deficiency   . Peptic ulcer disease   . Osteopenia   . Macular degeneration, right eye   . Arthritis     back, shoulders  . Cough 05/21/2011    pt states cough x 2 days  . Edema     left leg  . Hypoglycemia   . Urinary incontinence   . CAD 06/15/2009  . Sinoatrial node dysfunction 06/15/2009    Dual-chamber PM inserted 03/18/09 St. Jude Zephyr XL DR dual-chamber, seriel [id number].  . Tachy-brady syndrome     Dual-chamber PM inserted 03/18/09 St. Jude Zephyr XL DR dual-chamber, seriel [id number].  Marland Kitchen CAD (coronary artery disease)     With LAD & RCA BMS, 2008 & LAD DES 2009 for restenosis. Both vessels patent by cath 03/18/09  . Atrial flutter   . Left heart failure   . Dyspnea     frequent  . Rectal bleeding     Aggravated by a combo of coumadin & aspirin. D/C'ed aspirin.  . Chronic diastolic congestive heart failure   . Asthma with COPD    . GERD (gastroesophageal reflux disease)   . Irritable bowel   . Hip fracture, right   . Anorexia 08/27/12    Nearly 20 lbs weight loss over past year. Anorexia weight loss or major problems now & likely represent a slow progression/worsening of the terminal illness, perhaps GI. Probably time to start considering a change in care plan from aggresive/diagnostic to palliative (Dr. Verdis Prime 08/17/12)  . Coronary atherosclerosis of native coronary artery   . Chest discomfort 08/27/12    Nearly continuous.  . Poor appetite    Past Surgical History  Procedure Laterality Date  . Perforated ulcer    . Eye surgery      bilateral  . Pacemaker insertion  03/18/09    For symptomatic brady-tachy syndrome. St. Jude Zephyr XL DR dual-chamber, seriel [id number]  . Hip surgery      broke hip and has pin in it  .  Drug-eluting stent  [id number]    LAD & RCA BMS in 2008 & LAD DES in 2009 for restenosis. Both vessels patent by cath 03/18/09.  . Cardiac catheterization  03/18/09    With LAD & RCA BMS in 2008 & LAD DES in 2009 for restenosis.  . Varicose vein surgery    . Orif hip fracture Right     Pin in hip  . Cataract extraction Bilateral    Social History:   reports that she quit smoking about 17 years ago. She has never used smokeless tobacco. She reports that she does not drink alcohol or use illicit drugs.  Family History  Problem Relation Age of Onset  . CVA Mother   . Brain cancer Sister   . Heart attack Father   . Lymphoma Son   . CAD Son   . Testicular cancer Brother   . Alzheimer's disease Brother     Medications: Patient's Medications  New Prescriptions   No medications on file  Previous Medications   ACETAMINOPHEN (TYLENOL) 650 MG CR TABLET    Take 650 mg by mouth every 4 (four) hours as needed for pain or fever.    ALBUTEROL (PROVENTIL) (5 MG/ML) 0.5% NEBULIZER SOLUTION    Take 0.5 mLs (2.5 mg total) by nebulization every 6 (six) hours as needed for wheezing or shortness of  breath.   ALBUTEROL (VENTOLIN HFA) 108 (90 BASE) MCG/ACT INHALER    Inhale 2 puffs into the lungs every 4 (four) hours as needed for wheezing or shortness of breath. Wait 1 minute between each puff.   ALUMINUM & MAGNESIUM HYDROXIDE-SIMETHICONE (MYLANTA) 500-450-40 MG/5ML SUSPENSION    Take 30 mLs by mouth every 4 (four) hours as needed for indigestion.   ATORVASTATIN (LIPITOR) 10 MG TABLET    Take 10 mg by mouth every evening.    BISACODYL (DULCOLAX) 10 MG SUPPOSITORY    Place 10 mg rectally as needed for moderate constipation.   BUPRENORPHINE (BUTRANS) 7.5 MCG/HR PTWK    Place 7.5 mcg onto the skin once a week.   CALCIUM CARBONATE (OS-CAL) 600 MG TABS    Take 600 mg by mouth 2 (two) times daily.     CHOLECALCIFEROL (VITAMIN D3) 1000 UNITS CAPS    Take 1,000 Units by mouth 2 (two) times daily.    CYANOCOBALAMIN (VITAMIN B-12) 1000 MCG SUBL    Place 1 tablet under the tongue daily.   DICLOFENAC SODIUM (VOLTAREN) 1 % GEL    Apply 2 g topically 4 (four) times daily.   FLUTICASONE-SALMETEROL (ADVAIR DISKUS) 100-50 MCG/DOSE AEPB    Inhale 1 puff into the lungs 2 (two) times daily. Rinse mouth after use.   FUROSEMIDE (LASIX) 40 MG TABLET    Take 40 mg by mouth daily.   GUAIFENESIN (MUCINEX) 600 MG 12 HR TABLET    Take 600 mg by mouth 2 (two) times daily.   MAGNESIUM HYDROXIDE (MILK OF MAGNESIA) 400 MG/5ML SUSPENSION    Take 30 mLs by mouth daily.   METOPROLOL SUCCINATE (TOPROL-XL) 50 MG 24 HR TABLET    Take 1 tablet (50 mg total) by mouth daily. Take with or immediately following a meal.   MIRTAZAPINE (REMERON) 7.5 MG TABLET    Take 1 tablet (7.5 mg total) by mouth at bedtime.   MORPHINE (MS CONTIN) 15 MG 12 HR TABLET    Take 1 tablet (15 mg total) by mouth every 12 (twelve) hours.   MULTIPLE VITAMINS-MINERALS (PRESERVISION AREDS) CAPS    Take 1  capsule by mouth 2 (two) times daily.   NITROFURANTOIN, MACROCRYSTAL-MONOHYDRATE, (MACROBID) 100 MG CAPSULE    Take 100 mg by mouth 2 (two) times daily. For 7  days   NITROGLYCERIN (NITRODUR - DOSED IN MG/24 HR) 0.4 MG/HR    Place 1 patch onto the skin every evening. Nitrate free interval from 4pm - 8pm every day   NITROGLYCERIN (NITROSTAT) 0.4 MG SL TABLET    Place 0.4 mg under the tongue every 5 (five) minutes as needed for chest pain (up to 3 doses).    OMEGA-3 FATTY ACIDS (FISH OIL) 1000 MG CAPS    Take 1,000 mg by mouth 2 (two) times daily.   OXYBUTYNIN (DITROPAN-XL) 10 MG 24 HR TABLET    Take 10 mg by mouth at bedtime.   OXYCODONE (OXY IR/ROXICODONE) 5 MG IMMEDIATE RELEASE TABLET    Take 1 tablet (5 mg total) by mouth every 4 (four) hours as needed for severe pain.   PANTOPRAZOLE (PROTONIX) 40 MG TABLET    Take 40 mg by mouth 2 (two) times daily.   POLYETHYLENE GLYCOL (MIRALAX / GLYCOLAX) PACKET    Take 17 g by mouth daily as needed for moderate constipation.    POTASSIUM CHLORIDE (K-DUR,KLOR-CON) 10 MEQ TABLET    Take 10 mEq by mouth daily.   PREDNISONE (DELTASONE) 10 MG TABLET    Take 10-60 mg by mouth daily with breakfast. Take 60 mg by mouth on day 1, take 50 mg by mouth on day 2, take 40 mg by mouth on day 3, take 30 mg by mouth on day 4, take 20 mg by mouth on day 5, then take 10 mg by mouth on day 6.   SENNA-DOCUSATE (SENOKOT-S) 8.6-50 MG PER TABLET    Take 2 tablets by mouth at bedtime.   WARFARIN (COUMADIN) 4 MG TABLET    Take 4 mg by mouth daily.  Modified Medications   No medications on file  Discontinued Medications   No medications on file     Physical Exam: Physical Exam  Constitutional: She is oriented to person, place, and time. She appears well-developed and well-nourished. No distress.  HENT:  Head: Normocephalic and atraumatic.  Right Ear: External ear normal.  Left Ear: External ear normal.  Mouth/Throat: Oropharynx is clear and moist. No oropharyngeal exudate.  Eyes: Conjunctivae and EOM are normal. Pupils are equal, round, and reactive to light. Right eye exhibits no discharge. Left eye exhibits no discharge. No  scleral icterus.  Neck: Normal range of motion. Neck supple. No JVD present. No tracheal deviation present. No thyromegaly present.  Cardiovascular: Normal rate.   Murmur heard. Pulmonary/Chest: No stridor. No respiratory distress. She has no wheezes. She has no rales. She exhibits no tenderness.  Abdominal: She exhibits no distension. There is no tenderness. There is no rebound and no guarding.  Musculoskeletal: Normal range of motion. She exhibits edema and tenderness.  Trace edema. Chronic back pain. Ambulates with walker.   Lymphadenopathy:    She has no cervical adenopathy.  Neurological: She is alert and oriented to person, place, and time. She has normal reflexes. No cranial nerve deficit. She exhibits normal muscle tone. Coordination normal.  Skin: Skin is warm and dry. No rash noted. She is not diaphoretic. No erythema. No pallor.  Coccyx area small scabbed over area from previous pressure injury.    Psychiatric: She has a normal mood and affect. Her behavior is normal. Judgment and thought content normal.    Filed Vitals:  03/05/14 1313  BP: 110/60  Pulse: 86  Temp: 98.4 F (36.9 C)  TempSrc: Tympanic  Resp: 18      Labs reviewed: Basic Metabolic Panel:  Recent Labs  25/95/6312/22/14 1547 05/04/13 1949  11/19/13 0320 02/28/14 1127 03/01/14 0420  NA 135  --   < > 137 142 144  K 3.8  --   < > 4.0 3.9 3.8  CL 98  --   < > 100 103 106  CO2 24  --   < > 22 28 25   GLUCOSE 138*  --   < > 135* 100* 79  BUN 19  --   < > 30* 14 13  CREATININE 0.75 0.71  < > 0.69 0.65 0.67  CALCIUM 9.2  --   < > 9.2 9.7 9.1  MG  --  2.1  --   --   --  2.2  PHOS  --   --   --   --   --  3.1  TSH  --  1.141  --   --   --  6.680*  < > = values in this interval not displayed. Liver Function Tests:  Recent Labs  09/13/13 1420 02/28/14 1802 03/01/14 0420  AST 26 23 22   ALT 23 21 20   ALKPHOS 72 78 78  BILITOT 0.3 0.4 0.4  PROT 6.8 6.7 6.6  ALBUMIN 3.3* 3.0* 2.8*   No results found  for this basename: LIPASE, AMYLASE,  in the last 8760 hours CBC:  Recent Labs  05/05/13 0251  11/19/13 0320 02/28/14 1127 03/01/14 0420  WBC 8.6  < > 13.3* 6.4 7.3  NEUTROABS 6.0  --  11.3* 4.2  --   HGB 9.9*  < > 10.7* 11.0* 11.2*  HCT 29.9*  < > 32.9* 34.4* 35.0*  MCV 94.3  < > 92.7 93.2 93.8  PLT 158  < > 170 195 215  < > = values in this interval not displayed. Lipid Panel:  Recent Labs  05/04/13 1949  CHOL 94  HDL 55  LDLCALC 28  TRIG 53  CHOLHDL 1.7    Past Procedures:  05/04/2013 CLINICAL DATA: Fever. Shortness of breath. EXAM: CHEST 2 VIEW IMPRESSION: Cardiomegaly and interstitial opacity at the bases suggest mild CHF with interstitial pulmonary edema. Pneumonia considered less likely.   05/11/2013 CLINICAL DATA: Cough and congestion. EXAM: PORTABLE CHEST - 1 VIEW IMPRESSION: Mild cardiomegaly with central pulmonary vascular prominence. No segmental consolidation. Calcified tortuous aorta.  Assessment/Plan Acute exacerbation of chronic low back pain -f/u with Dr Ethelene Halamos in regards to back pain- gets "injections in her back" per her family by Dr Bonna Gainsamos-last one was in January and it appears to have worn off  - her daughter will call to have her scheduled for another one as soon as possible as her back pain is uncontrolled despite use of narcotics  - During this hospital stay we have started MS Contin every 12 hours, PRN oxycodone and Voltaren gel -despite these measures she continues to have mild to moderate amount of pain in her back and as recommended above will need to followup with Dr. Algis Greenhouseamo as soon as possible   03/05/14 continue Butrans, Morphine, and prn Oxycodone-will assess pain q4hr for now. Complete Prednisone taper dose.     Constipation Adding MiraLax daily in addition to Senna S II qhs.   RUQ abdominal pain - Dilated duct on US, Murphy sign negative. Her pain was more diffuse. LFT's within normal.  -Likely due  to severe constipation  -Placed on  aggressive bowel regimen with clearing of constipation and resolution of right upper quadrant pain.    UTI (lower urinary tract infection)  Many bacteria and small leukocytes noted on UA on admission however, she has no leukocytosis on CBC, no supra pubic tenderness, dysuria or urgency.  -She was initially placed on antibiotics on the day of admission but due to lack of symptoms, these have been discontinued    Paroxysmal atrial fibrillation - on coumadin. PT/INR 03/05/14 pending.  - rate controlled.   Chronic diastolic CHF (congestive heart failure) - compensated- on Lasix and Metoprolol   Essential hypertension Controlled.   Depression Continue Mirtazapine 7.5mg  po qhs.     Family/ Staff Communication: observe the patient.   Goals of Care: SNF for strengthening an goal is to return to [facility]  Labs/tests ordered: none

## 2014-03-05 NOTE — Assessment & Plan Note (Signed)
-   on coumadin. PT/INR 03/05/14 pending.  - rate controlled.

## 2014-03-05 NOTE — Assessment & Plan Note (Signed)
Continue Mirtazapine 7.5mg  po qhs.

## 2014-03-09 ENCOUNTER — Encounter: Payer: Self-pay | Admitting: Nurse Practitioner

## 2014-03-09 ENCOUNTER — Non-Acute Institutional Stay (SKILLED_NURSING_FACILITY): Payer: Medicare Other | Admitting: Nurse Practitioner

## 2014-03-09 DIAGNOSIS — I482 Chronic atrial fibrillation, unspecified: Secondary | ICD-10-CM

## 2014-03-09 DIAGNOSIS — M545 Low back pain: Secondary | ICD-10-CM

## 2014-03-09 DIAGNOSIS — I10 Essential (primary) hypertension: Secondary | ICD-10-CM

## 2014-03-09 DIAGNOSIS — I5032 Chronic diastolic (congestive) heart failure: Secondary | ICD-10-CM

## 2014-03-09 DIAGNOSIS — G8929 Other chronic pain: Secondary | ICD-10-CM

## 2014-03-09 DIAGNOSIS — K5900 Constipation, unspecified: Secondary | ICD-10-CM

## 2014-03-09 DIAGNOSIS — N390 Urinary tract infection, site not specified: Secondary | ICD-10-CM

## 2014-03-09 DIAGNOSIS — F32A Depression, unspecified: Secondary | ICD-10-CM

## 2014-03-09 DIAGNOSIS — F329 Major depressive disorder, single episode, unspecified: Secondary | ICD-10-CM

## 2014-03-09 NOTE — Assessment & Plan Note (Addendum)
Bare-metal stent to RCA 2008, bare-metal stent to LAD-2008, 2 Cypher stents in 2009 to LAD for in-stent restenosis. Prn NGT    

## 2014-03-09 NOTE — Assessment & Plan Note (Signed)
03/08/14 urine culture showed many yeast: will treat with Diflucan 100mg  po daily x 4 days.

## 2014-03-09 NOTE — Assessment & Plan Note (Signed)
-   compensated- on Lasix and Metoprolol  -03/05/14 CXR no radiographic evidence of acute pulmonary disease.

## 2014-03-09 NOTE — Progress Notes (Signed)
Patient ID: [name], female   DOB: 1916-02-26, 80 y.o.   MRN: [id number]   Code Status: Full Code  Allergies  Allergen Reactions  . Ampicillin Other (See Comments)    Reaction Unknown   . Ciprofloxin Hcl [Ciprofloxacin Hcl] Other (See Comments)    Reaction Unknown, has received Levaquin 12/14  . Demerol Other (See Comments)    Reaction Unknown   . Sulfa Antibiotics Rash    Chief Complaint  Patient presents with  . Medical Management of Chronic Issues  . Acute Visit    yeast UTI, intractable back pain.     HPI: Patient is a 80 y.o. female [facility] for evaluation of yeast UTI, intractable back pain,  and other chronic medical conditions.    Hospitalized 02/28/2014-03/04/2014 Acute exacerbation of chronic low back pain and severe constipation.    Hospitalized from 05/04/2013 to 05/11/2013 for SOB, PNA, CHF, generalized weakness. She has a long standing hx of atrial fibrillation, on coumadin, pacemaker, CAD with multiple cardiac stents, history of TIA's, dyslipidemia. CXR in ED  revealed cardiomegaly and interstitial opacity at bases, mild CHF, less likely pneumonia. Because of the fever and hypoxia pt was started on broad spectrum antibiotics for HCAP. Pt has respiratory cultures positive for pseudomonas sensitive to cipro. I spoke with infectious disease, Dr. Orvan Falconerampbell and he recommended treating with total of 8 days (including what she has gotten in hospital). SOB at rest today Problem List Items Addressed This Visit   UTI (lower urinary tract infection) - Primary     03/08/14 urine culture showed many yeast: will treat with Diflucan 100mg  po daily x 4 days.      Paroxysmal atrial fibrillation     - on coumadin. PT/INR 03/11/14  - rate controlled.      Essential hypertension     Controlled.      Depression     Continue Mirtazapine 7.5mg  po qhs.      Constipation     Managed, taking MiraLax daily in addition to Senna S II qhs.      Chronic diastolic CHF (congestive heart failure)     - compensated- on Lasix and Metoprolol  -03/05/14 CXR no radiographic evidence of acute pulmonary disease.      Acute exacerbation of chronic low back pain      03/08/14 Gustavus MessingJack Talent: intractable debilitating bilateral pain in lower back, groin, down the front and back of legs, into knees and calves. Shoulder pain with limited motions. Weightbearing therapies be deferred until after Friday tx has had a chance to work its magic.   03/09/14 spinal inj 03/12/14-f/u with Dr Ethelene Halamos in regards to back pain- gets "injections in her back" per her family by Dr Bonna Gainsamos-last one was in January and it appears to have worn off   During this hospital stay we have started MS Contin every 12 hours, PRN oxycodone and Voltaren gel -despite these measures she continues to have mild to moderate amount of pain in her back   03/05/14 continue Butrans, Morphine, and prn Oxycodone-assess pain q4hr for now. Complete Prednisone taper dose.             Review of Systems:  Review of Systems  Constitutional: Positive for malaise/fatigue. Negative for fever, chills, weight loss and diaphoresis.  HENT: Positive for hearing loss. Negative for congestion, ear discharge, ear pain, nosebleeds, sore throat and tinnitus.   Eyes: Negative for blurred vision, double vision, photophobia, pain, discharge and  redness.  Respiratory: Positive for shortness of breath. Negative for cough, hemoptysis, sputum production, wheezing and stridor.   Cardiovascular: Positive for leg swelling and PND. Negative for chest pain, palpitations, orthopnea and claudication.       Trace  Gastrointestinal: Positive for constipation. Negative for heartburn, nausea, vomiting, abdominal pain, diarrhea, blood in stool and melena.  Genitourinary: Positive for frequency. Negative for dysuria, urgency, hematuria and flank pain.  Musculoskeletal: Positive for back pain. Negative for falls, joint pain,  myalgias and neck pain.  Skin: Negative for itching and rash.       Coccyx area small scabbed over area from previous pressure injury.   Neurological: Positive for weakness. Negative for dizziness, tingling, tremors, sensory change, speech change, focal weakness, seizures, loss of consciousness and headaches.  Endo/Heme/Allergies: Negative for environmental allergies and polydipsia. Bruises/bleeds easily.  Psychiatric/Behavioral: Negative for depression, suicidal ideas, hallucinations, memory loss and substance abuse. The patient is not nervous/anxious and does not have insomnia.      Past Medical History  Diagnosis Date  . Transient ischemic attack (TIA) 1994  . Atrial fibrillation   . Hypertension   . Dyslipidemia   . Gastritis   . Vitamin B12 deficiency   . Peptic ulcer disease   . Osteopenia   . Macular degeneration, right eye   . Arthritis     back, shoulders  . Cough 05/21/2011    pt states cough x 2 days  . Edema     left leg  . Hypoglycemia   . Urinary incontinence   . CAD 06/15/2009  . Sinoatrial node dysfunction 06/15/2009    Dual-chamber PM inserted 03/18/09 St. Jude Zephyr XL DR dual-chamber, seriel [id number].  . Tachy-brady syndrome     Dual-chamber PM inserted 03/18/09 St. Jude Zephyr XL DR dual-chamber, seriel [id number].  Marland Kitchen CAD (coronary artery disease)     With LAD & RCA BMS, 2008 & LAD DES 2009 for restenosis. Both vessels patent by cath 03/18/09  . Atrial flutter   . Left heart failure   . Dyspnea     frequent  . Rectal bleeding     Aggravated by a combo of coumadin & aspirin. D/C'ed aspirin.  . Chronic diastolic congestive heart failure   . Asthma with COPD   . GERD (gastroesophageal reflux disease)   . Irritable bowel   . Hip fracture, right   . Anorexia 08/27/12    Nearly 20 lbs weight loss over past year. Anorexia weight loss or major problems now & likely represent a slow progression/worsening of the terminal illness, perhaps GI. Probably time to start  considering a change in care plan from aggresive/diagnostic to palliative (Dr. Verdis Prime 08/17/12)  . Coronary atherosclerosis of native coronary artery   . Chest discomfort 08/27/12    Nearly continuous.  . Poor appetite    Past Surgical History  Procedure Laterality Date  . Perforated ulcer    . Eye surgery      bilateral  . Pacemaker insertion  03/18/09    For symptomatic brady-tachy syndrome. St. Jude Zephyr XL DR dual-chamber, seriel [id number]  . Hip surgery      broke hip and has pin in it  . Drug-eluting stent  [id number],[id number]    LAD & RCA BMS in 2008 & LAD DES in 2009 for restenosis. Both vessels patent by cath 03/18/09.  . Cardiac catheterization  03/18/09    With LAD & RCA BMS in 2008 & LAD DES in 2009 for restenosis.  . Varicose  vein surgery    . Orif hip fracture Right     Pin in hip  . Cataract extraction Bilateral    Social History:   reports that she quit smoking about 17 years ago. She has never used smokeless tobacco. She reports that she does not drink alcohol or use illicit drugs.  Family History  Problem Relation Age of Onset  . CVA Mother   . Brain cancer Sister   . Heart attack Father   . Lymphoma Son   . CAD Son   . Testicular cancer Brother   . Alzheimer's disease Brother     Medications: Patient's Medications  New Prescriptions   No medications on file  Previous Medications   ACETAMINOPHEN (TYLENOL) 650 MG CR TABLET    Take 650 mg by mouth every 4 (four) hours as needed for pain or fever.    ALBUTEROL (PROVENTIL) (5 MG/ML) 0.5% NEBULIZER SOLUTION    Take 0.5 mLs (2.5 mg total) by nebulization every 6 (six) hours as needed for wheezing or shortness of breath.   ALBUTEROL (VENTOLIN HFA) 108 (90 BASE) MCG/ACT INHALER    Inhale 2 puffs into the lungs every 4 (four) hours as needed for wheezing or shortness of breath. Wait 1 minute between each puff.   ALUMINUM & MAGNESIUM HYDROXIDE-SIMETHICONE (MYLANTA) 500-450-40 MG/5ML SUSPENSION    Take 30 mLs by mouth every  4 (four) hours as needed for indigestion.   ATORVASTATIN (LIPITOR) 10 MG TABLET    Take 10 mg by mouth every evening.    BISACODYL (DULCOLAX) 10 MG SUPPOSITORY    Place 10 mg rectally as needed for moderate constipation.   BUPRENORPHINE (BUTRANS) 7.5 MCG/HR PTWK    Place 7.5 mcg onto the skin once a week.   CALCIUM CARBONATE (OS-CAL) 600 MG TABS    Take 600 mg by mouth 2 (two) times daily.     CHOLECALCIFEROL (VITAMIN D3) 1000 UNITS CAPS    Take 1,000 Units by mouth 2 (two) times daily.    CYANOCOBALAMIN (VITAMIN B-12) 1000 MCG SUBL    Place 1 tablet under the tongue daily.   DICLOFENAC SODIUM (VOLTAREN) 1 % GEL    Apply 2 g topically 4 (four) times daily.   FLUTICASONE-SALMETEROL (ADVAIR DISKUS) 100-50 MCG/DOSE AEPB    Inhale 1 puff into the lungs 2 (two) times daily. Rinse mouth after use.   FUROSEMIDE (LASIX) 40 MG TABLET    Take 40 mg by mouth daily.   GUAIFENESIN (MUCINEX) 600 MG 12 HR TABLET    Take 600 mg by mouth 2 (two) times daily.   MAGNESIUM HYDROXIDE (MILK OF MAGNESIA) 400 MG/5ML SUSPENSION    Take 30 mLs by mouth daily.   METOPROLOL SUCCINATE (TOPROL-XL) 50 MG 24 HR TABLET    Take 1 tablet (50 mg total) by mouth daily. Take with or immediately following a meal.   MIRTAZAPINE (REMERON) 7.5 MG TABLET    Take 1 tablet (7.5 mg total) by mouth at bedtime.   MORPHINE (MS CONTIN) 15 MG 12 HR TABLET    Take 1 tablet (15 mg total) by mouth every 12 (twelve) hours.   MULTIPLE VITAMINS-MINERALS (PRESERVISION AREDS) CAPS    Take 1 capsule by mouth 2 (two) times daily.   NITROFURANTOIN, MACROCRYSTAL-MONOHYDRATE, (MACROBID) 100 MG CAPSULE    Take 100 mg by mouth 2 (two) times daily. For 7 days   NITROGLYCERIN (NITRODUR - DOSED IN MG/24 HR) 0.4 MG/HR    Place 1 patch onto the skin every evening.  Nitrate free interval from 4pm - 8pm every day   NITROGLYCERIN (NITROSTAT) 0.4 MG SL TABLET    Place 0.4 mg under the tongue every 5 (five) minutes as needed for chest pain (up to 3 doses).    OMEGA-3 FATTY  ACIDS (FISH OIL) 1000 MG CAPS    Take 1,000 mg by mouth 2 (two) times daily.   OXYBUTYNIN (DITROPAN-XL) 10 MG 24 HR TABLET    Take 10 mg by mouth at bedtime.   OXYCODONE (OXY IR/ROXICODONE) 5 MG IMMEDIATE RELEASE TABLET    Take 1 tablet (5 mg total) by mouth every 4 (four) hours as needed for severe pain.   PANTOPRAZOLE (PROTONIX) 40 MG TABLET    Take 40 mg by mouth 2 (two) times daily.   POLYETHYLENE GLYCOL (MIRALAX / GLYCOLAX) PACKET    Take 17 g by mouth daily as needed for moderate constipation.    POTASSIUM CHLORIDE (K-DUR,KLOR-CON) 10 MEQ TABLET    Take 10 mEq by mouth daily.   PREDNISONE (DELTASONE) 10 MG TABLET    Take 10-60 mg by mouth daily with breakfast. Take 60 mg by mouth on day 1, take 50 mg by mouth on day 2, take 40 mg by mouth on day 3, take 30 mg by mouth on day 4, take 20 mg by mouth on day 5, then take 10 mg by mouth on day 6.   SENNA-DOCUSATE (SENOKOT-S) 8.6-50 MG PER TABLET    Take 2 tablets by mouth at bedtime.   WARFARIN (COUMADIN) 4 MG TABLET    Take 4 mg by mouth daily.  Modified Medications   No medications on file  Discontinued Medications   No medications on file     Physical Exam: Physical Exam  Constitutional: She is oriented to person, place, and time. She appears well-developed and well-nourished. No distress.  HENT:  Head: Normocephalic and atraumatic.  Right Ear: External ear normal.  Left Ear: External ear normal.  Mouth/Throat: Oropharynx is clear and moist. No oropharyngeal exudate.  Eyes: Conjunctivae and EOM are normal. Pupils are equal, round, and reactive to light. Right eye exhibits no discharge. Left eye exhibits no discharge. No scleral icterus.  Neck: Normal range of motion. Neck supple. No JVD present. No tracheal deviation present. No thyromegaly present.  Cardiovascular: Normal rate.   Murmur heard. Pulmonary/Chest: No stridor. No respiratory distress. She has no wheezes. She has no rales. She exhibits no tenderness.  Abdominal: She  exhibits no distension. There is no tenderness. There is no rebound and no guarding.  Musculoskeletal: Normal range of motion. She exhibits edema and tenderness.  Trace edema. Chronic back pain. Ambulates with walker.   Lymphadenopathy:    She has no cervical adenopathy.  Neurological: She is alert and oriented to person, place, and time. She has normal reflexes. No cranial nerve deficit. She exhibits normal muscle tone. Coordination normal.  Skin: Skin is warm and dry. No rash noted. She is not diaphoretic. No erythema. No pallor.  Coccyx area small scabbed over area from previous pressure injury.    Psychiatric: She has a normal mood and affect. Her behavior is normal. Judgment and thought content normal.    Filed Vitals:   03/09/14 1316  BP: 90/58  Pulse: 70  Temp: 98.2 F (36.8 C)  TempSrc: Tympanic  Resp: 20      Labs reviewed: Basic Metabolic Panel:  Recent Labs  30/86/57 1547 05/04/13 1949  11/19/13 0320 02/28/14 1127 03/01/14 0420  NA 135  --   < >  137 142 144  K 3.8  --   < > 4.0 3.9 3.8  CL 98  --   < > 100 103 106  CO2 24  --   < > 22 28 25   GLUCOSE 138*  --   < > 135* 100* 79  BUN 19  --   < > 30* 14 13  CREATININE 0.75 0.71  < > 0.69 0.65 0.67  CALCIUM 9.2  --   < > 9.2 9.7 9.1  MG  --  2.1  --   --   --  2.2  PHOS  --   --   --   --   --  3.1  TSH  --  1.141  --   --   --  6.680*  < > = values in this interval not displayed. Liver Function Tests:  Recent Labs  09/13/13 1420 02/28/14 1802 03/01/14 0420  AST 26 23 22   ALT 23 21 20   ALKPHOS 72 78 78  BILITOT 0.3 0.4 0.4  PROT 6.8 6.7 6.6  ALBUMIN 3.3* 3.0* 2.8*   No results found for this basename: LIPASE, AMYLASE,  in the last 8760 hours CBC:  Recent Labs  05/05/13 0251  11/19/13 0320 02/28/14 1127 03/01/14 0420  WBC 8.6  < > 13.3* 6.4 7.3  NEUTROABS 6.0  --  11.3* 4.2  --   HGB 9.9*  < > 10.7* 11.0* 11.2*  HCT 29.9*  < > 32.9* 34.4* 35.0*  MCV 94.3  < > 92.7 93.2 93.8  PLT 158  <  > 170 195 215  < > = values in this interval not displayed. Lipid Panel:  Recent Labs  05/04/13 1949  CHOL 94  HDL 55  LDLCALC 28  TRIG 53  CHOLHDL 1.7    Past Procedures:  05/04/2013 CLINICAL DATA: Fever. Shortness of breath. EXAM: CHEST 2 VIEW IMPRESSION: Cardiomegaly and interstitial opacity at the bases suggest mild CHF with interstitial pulmonary edema. Pneumonia considered less likely.   05/11/2013 CLINICAL DATA: Cough and congestion. EXAM: PORTABLE CHEST - 1 VIEW IMPRESSION: Mild cardiomegaly with central pulmonary vascular prominence. No segmental consolidation. Calcified tortuous aorta.  Assessment/Plan UTI (lower urinary tract infection) 03/08/14 urine culture showed many yeast: will treat with Diflucan 100mg  po daily x 4 days.    Paroxysmal atrial fibrillation - on coumadin. PT/INR 03/11/14  - rate controlled.    Chronic diastolic CHF (congestive heart failure) - compensated- on Lasix and Metoprolol  -03/05/14 CXR no radiographic evidence of acute pulmonary disease.    Constipation Managed, taking MiraLax daily in addition to Senna S II qhs.    Coronary atherosclerosis Bare-metal stent to RCA 2008, bare-metal stent to LAD-2008, 2 Cypher stents in 2009 to LAD for in-stent restenosis. Prn NGT    Depression Continue Mirtazapine 7.5mg  po qhs.    Essential hypertension Controlled.    Acute exacerbation of chronic low back pain  03/08/14 Gustavus MessingJack Esters: intractable debilitating bilateral pain in lower back, groin, down the front and back of legs, into knees and calves. Shoulder pain with limited motions. Weightbearing therapies be deferred until after Friday tx has had a chance to work its magic.   03/09/14 spinal inj 03/12/14-f/u with Dr Ethelene Halamos in regards to back pain- gets "injections in her back" per her family by Dr Bonna Gainsamos-last one was in January and it appears to have worn off   During this hospital stay we have started MS Contin every 12 hours, PRN  oxycodone and Voltaren  gel -despite these measures she continues to have mild to moderate amount of pain in her back   03/05/14 continue Butrans, Morphine, and prn Oxycodone-assess pain q4hr for now. Complete Prednisone taper dose.          Family/ Staff Communication: observe the patient.   Goals of Care: SNF for strengthening an goal is to return to [place]  Labs/tests ordered: none

## 2014-03-09 NOTE — Assessment & Plan Note (Signed)
03/08/14 [name]: intractable debilitating bilateral pain in lower back, groin, down the front and back of legs, into knees and calves. Shoulder pain with limited motions. Weightbearing therapies be deferred until after Friday tx has had a chance to work its magic.   03/09/14 spinal inj 03/12/14-f/u with Dr Ethelene Halamos in regards to back pain- gets "injections in her back" per her family by Dr Bonna Gainsamos-last one was in January and it appears to have worn off   During this hospital stay we have started MS Contin every 12 hours, PRN oxycodone and Voltaren gel -despite these measures she continues to have mild to moderate amount of pain in her back   03/05/14 continue Butrans, Morphine, and prn Oxycodone-assess pain q4hr for now. Complete Prednisone taper dose.

## 2014-03-09 NOTE — Assessment & Plan Note (Signed)
Controlled.  

## 2014-03-09 NOTE — Assessment & Plan Note (Signed)
Managed, taking MiraLax daily in addition to Senna S II qhs.

## 2014-03-09 NOTE — Assessment & Plan Note (Signed)
-   on coumadin. PT/INR 03/11/14  - rate controlled.

## 2014-03-09 NOTE — Assessment & Plan Note (Signed)
Continue Mirtazapine 7.5mg po qhs.  

## 2014-03-23 ENCOUNTER — Non-Acute Institutional Stay (SKILLED_NURSING_FACILITY): Payer: Medicare Other | Admitting: Nurse Practitioner

## 2014-03-23 ENCOUNTER — Encounter: Payer: Self-pay | Admitting: Nurse Practitioner

## 2014-03-23 DIAGNOSIS — R634 Abnormal weight loss: Secondary | ICD-10-CM

## 2014-03-23 DIAGNOSIS — N390 Urinary tract infection, site not specified: Secondary | ICD-10-CM

## 2014-03-23 DIAGNOSIS — I10 Essential (primary) hypertension: Secondary | ICD-10-CM

## 2014-03-23 DIAGNOSIS — M545 Low back pain, unspecified: Secondary | ICD-10-CM

## 2014-03-23 DIAGNOSIS — L8992 Pressure ulcer of unspecified site, stage 2: Secondary | ICD-10-CM

## 2014-03-23 DIAGNOSIS — I2510 Atherosclerotic heart disease of native coronary artery without angina pectoris: Secondary | ICD-10-CM

## 2014-03-23 DIAGNOSIS — K5900 Constipation, unspecified: Secondary | ICD-10-CM

## 2014-03-23 DIAGNOSIS — I482 Chronic atrial fibrillation, unspecified: Secondary | ICD-10-CM

## 2014-03-23 DIAGNOSIS — F32A Depression, unspecified: Secondary | ICD-10-CM

## 2014-03-23 DIAGNOSIS — I5032 Chronic diastolic (congestive) heart failure: Secondary | ICD-10-CM

## 2014-03-23 DIAGNOSIS — G8929 Other chronic pain: Secondary | ICD-10-CM

## 2014-03-23 DIAGNOSIS — F329 Major depressive disorder, single episode, unspecified: Secondary | ICD-10-CM

## 2014-03-23 NOTE — Assessment & Plan Note (Signed)
Managed, taking MiraLax daily in addition to Senna S II qhs.   

## 2014-03-23 NOTE — Assessment & Plan Note (Signed)
Controlled.  

## 2014-03-23 NOTE — Assessment & Plan Note (Signed)
Gradually weight loss-1-2 Ibs a week. She denied GI symptoms. Will try increase Mirtazapine from 7.5mg  to 15mg . Dietary supplements. Monitor weight. Obtain CBC, CMP, BNP, and TSH

## 2014-03-23 NOTE — Assessment & Plan Note (Signed)
03/08/14 Gustavus MessingJack Phoenix: intractable debilitating bilateral pain in lower back, groin, down the front and back of legs, into knees and calves. Shoulder pain with limited motions. Weightbearing therapies be deferred until after Friday tx has had a chance to work its magic.  03/09/14 spinal inj 03/12/14 03/23/14 Morphine 15mg  q12h prn, schedule Oxycodone 5mg  8am and 12noon and q4h prn for pain control.

## 2014-03-23 NOTE — Assessment & Plan Note (Signed)
-   on coumadin.  - rate controlled.     

## 2014-03-23 NOTE — Assessment & Plan Note (Signed)
Weight loss gradually, will increase Mirtazapine from 7.5mg  to 15mg  po qhs.

## 2014-03-23 NOTE — Progress Notes (Signed)
Patient ID: [name], female   DOB: 1916/03/07, 80 y.o.   MRN: [id number]   Code Status: Full Code  Allergies  Allergen Reactions  . Ampicillin Other (See Comments)    Reaction Unknown   . Ciprofloxin Hcl [Ciprofloxacin Hcl] Other (See Comments)    Reaction Unknown, has received Levaquin 12/14  . Demerol Other (See Comments)    Reaction Unknown   . Sulfa Antibiotics Rash    Chief Complaint  Patient presents with  . Medical Management of Chronic Issues  . Acute Visit    weigth loss, back pain, pressure ulcers intergluteal cleft    HPI: Patient is a 80 y.o. female seen in the SNF at [facility] today for evaluation of weight loss, intergluteal cleft pressure ulcers, intractable back pain,  and other chronic medical conditions.    Hospitalized 02/28/2014-03/04/2014 Acute exacerbation of chronic low back pain and severe constipation.    Hospitalized from 05/04/2013 to 05/11/2013 for SOB, PNA, CHF, generalized weakness. She has a long standing hx of atrial fibrillation, on coumadin, pacemaker, CAD with multiple cardiac stents, history of TIA's, dyslipidemia. CXR in ED  revealed cardiomegaly and interstitial opacity at bases, mild CHF, less likely pneumonia. Because of the fever and hypoxia pt was started on broad spectrum antibiotics for HCAP. Pt has respiratory cultures positive for pseudomonas sensitive to cipro. I spoke with infectious disease, Dr. Orvan Falconer and he recommended treating with total of 8 days (including what she has gotten in hospital). SOB at rest today Problem List Items Addressed This Visit    UTI (lower urinary tract infection)    Fully treated and clinically healed.     Pressure ulcer stage II - Primary    Small about 1cm superficial stage II pressure ulcer in the intergluteal cleft-will apply collagen and silver dressing q3days, pressure reduction, nutritional support. Observe.     Paroxysmal atrial fibrillation    - on coumadin.  - rate controlled.         Loss of weight    Gradually weight loss-1-2 Ibs a week. She denied GI symptoms. Will try increase Mirtazapine from 7.5mg  to 15mg . Dietary supplements. Monitor weight. Obtain CBC, CMP, BNP, and TSH    Essential hypertension    Controlled.       Depression    Weight loss gradually, will increase Mirtazapine from 7.5mg  to 15mg  po qhs.     Coronary atherosclerosis    Bare-metal stent to RCA 2008, bare-metal stent to LAD-2008, 2 Cypher stents in 2009 to LAD for in-stent restenosis. Prn NGT       Constipation    Managed, taking MiraLax daily in addition to Senna S II qhs.       Chronic diastolic CHF (congestive heart failure)    - compensated- on Lasix and Metoprolol  -03/05/14 CXR no radiographic evidence of acute pulmonary disease.  - CMP and BNP      Acute exacerbation of chronic low back pain    03/08/14 Gustavus Messing: intractable debilitating bilateral pain in lower back, groin, down the front and back of legs, into knees and calves. Shoulder pain with limited motions. Weightbearing therapies be deferred until after Friday tx has had a chance to work its magic.  03/09/14 spinal inj 03/12/14 03/23/14 Morphine 15mg  q12h prn, schedule Oxycodone 5mg  8am and 12noon and q4h prn for pain control.         Review of Systems:  Review of Systems  Constitutional: Positive for weight loss and malaise/fatigue. Negative for  fever, chills and diaphoresis.  HENT: Positive for hearing loss. Negative for congestion, ear discharge, ear pain, nosebleeds, sore throat and tinnitus.   Eyes: Negative for blurred vision, double vision, photophobia, pain, discharge and redness.  Respiratory: Positive for shortness of breath. Negative for cough, hemoptysis, sputum production, wheezing and stridor.   Cardiovascular: Positive for leg swelling and PND. Negative for chest pain, palpitations, orthopnea and claudication.       Trace  Gastrointestinal: Positive for constipation. Negative for  heartburn, nausea, vomiting, abdominal pain, diarrhea, blood in stool and melena.  Genitourinary: Positive for frequency. Negative for dysuria, urgency, hematuria and flank pain.  Musculoskeletal: Positive for back pain. Negative for myalgias, joint pain, falls and neck pain.  Skin: Negative for itching and rash.       Coccyx area small scabbed over area from previous pressure injury.   Neurological: Positive for weakness. Negative for dizziness, tingling, tremors, sensory change, speech change, focal weakness, seizures, loss of consciousness and headaches.  Endo/Heme/Allergies: Negative for environmental allergies and polydipsia. Bruises/bleeds easily.  Psychiatric/Behavioral: Negative for depression, suicidal ideas, hallucinations, memory loss and substance abuse. The patient is not nervous/anxious and does not have insomnia.      Past Medical History  Diagnosis Date  . Transient ischemic attack (TIA) 1994  . Atrial fibrillation   . Hypertension   . Dyslipidemia   . Gastritis   . Vitamin B12 deficiency   . Peptic ulcer disease   . Osteopenia   . Macular degeneration, right eye   . Arthritis     back, shoulders  . Cough 05/21/2011    pt states cough x 2 days  . Edema     left leg  . Hypoglycemia   . Urinary incontinence   . CAD 06/15/2009  . Sinoatrial node dysfunction 06/15/2009    Dual-chamber PM inserted 03/18/09 St. Jude Zephyr XL DR dual-chamber, seriel [id number].  . Tachy-brady syndrome     Dual-chamber PM inserted 03/18/09 St. Jude Zephyr XL DR dual-chamber, seriel [id number].  Marland Kitchen CAD (coronary artery disease)     With LAD & RCA BMS, 2008 & LAD DES 2009 for restenosis. Both vessels patent by cath 03/18/09  . Atrial flutter   . Left heart failure   . Dyspnea     frequent  . Rectal bleeding     Aggravated by a combo of coumadin & aspirin. D/C'ed aspirin.  . Chronic diastolic congestive heart failure   . Asthma with COPD   . GERD (gastroesophageal reflux disease)   . Irritable  bowel   . Hip fracture, right   . Anorexia 08/27/12    Nearly 20 lbs weight loss over past year. Anorexia weight loss or major problems now & likely represent a slow progression/worsening of the terminal illness, perhaps GI. Probably time to start considering a change in care plan from aggresive/diagnostic to palliative (Dr. Verdis Prime 08/17/12)  . Coronary atherosclerosis of native coronary artery   . Chest discomfort 08/27/12    Nearly continuous.  . Poor appetite    Past Surgical History  Procedure Laterality Date  . Perforated ulcer    . Eye surgery      bilateral  . Pacemaker insertion  03/18/09    For symptomatic brady-tachy syndrome. St. Jude Zephyr XL DR dual-chamber, seriel [id number]  . Hip surgery      broke hip and has pin in it  . Drug-eluting stent  [id number]    LAD & RCA BMS in 2008 & LAD DES in  2009 for restenosis. Both vessels patent by cath 03/18/09.  . Cardiac catheterization  03/18/09    With LAD & RCA BMS in 2008 & LAD DES in 2009 for restenosis.  . Varicose vein surgery    . Orif hip fracture Right     Pin in hip  . Cataract extraction Bilateral    Social History:   reports that she quit smoking about 18 years ago. She has never used smokeless tobacco. She reports that she does not drink alcohol or use illicit drugs.  Family History  Problem Relation Age of Onset  . CVA Mother   . Brain cancer Sister   . Heart attack Father   . Lymphoma Son   . CAD Son   . Testicular cancer Brother   . Alzheimer's disease Brother     Medications: Patient's Medications  New Prescriptions   No medications on file  Previous Medications   ACETAMINOPHEN (TYLENOL) 650 MG CR TABLET    Take 650 mg by mouth every 4 (four) hours as needed for pain or fever.    ALBUTEROL (PROVENTIL) (5 MG/ML) 0.5% NEBULIZER SOLUTION    Take 0.5 mLs (2.5 mg total) by nebulization every 6 (six) hours as needed for wheezing or shortness of breath.   ALBUTEROL (VENTOLIN HFA) 108 (90 BASE) MCG/ACT  INHALER    Inhale 2 puffs into the lungs every 4 (four) hours as needed for wheezing or shortness of breath. Wait 1 minute between each puff.   ALUMINUM & MAGNESIUM HYDROXIDE-SIMETHICONE (MYLANTA) 500-450-40 MG/5ML SUSPENSION    Take 30 mLs by mouth every 4 (four) hours as needed for indigestion.   ATORVASTATIN (LIPITOR) 10 MG TABLET    Take 10 mg by mouth every evening.    BISACODYL (DULCOLAX) 10 MG SUPPOSITORY    Place 10 mg rectally as needed for moderate constipation.   BUPRENORPHINE (BUTRANS) 7.5 MCG/HR PTWK    Place 7.5 mcg onto the skin once a week.   CALCIUM CARBONATE (OS-CAL) 600 MG TABS    Take 600 mg by mouth 2 (two) times daily.     CHOLECALCIFEROL (VITAMIN D3) 1000 UNITS CAPS    Take 1,000 Units by mouth 2 (two) times daily.    CYANOCOBALAMIN (VITAMIN B-12) 1000 MCG SUBL    Place 1 tablet under the tongue daily.   DICLOFENAC SODIUM (VOLTAREN) 1 % GEL    Apply 2 g topically 4 (four) times daily.   FLUTICASONE-SALMETEROL (ADVAIR DISKUS) 100-50 MCG/DOSE AEPB    Inhale 1 puff into the lungs 2 (two) times daily. Rinse mouth after use.   FUROSEMIDE (LASIX) 40 MG TABLET    Take 40 mg by mouth daily.   GUAIFENESIN (MUCINEX) 600 MG 12 HR TABLET    Take 600 mg by mouth 2 (two) times daily.   MAGNESIUM HYDROXIDE (MILK OF MAGNESIA) 400 MG/5ML SUSPENSION    Take 30 mLs by mouth daily.   METOPROLOL SUCCINATE (TOPROL-XL) 50 MG 24 HR TABLET    Take 1 tablet (50 mg total) by mouth daily. Take with or immediately following a meal.   MIRTAZAPINE (REMERON) 7.5 MG TABLET    Take 1 tablet (7.5 mg total) by mouth at bedtime.   MORPHINE (MS CONTIN) 15 MG 12 HR TABLET    Take 1 tablet (15 mg total) by mouth every 12 (twelve) hours.   MULTIPLE VITAMINS-MINERALS (PRESERVISION AREDS) CAPS    Take 1 capsule by mouth 2 (two) times daily.   NITROFURANTOIN, MACROCRYSTAL-MONOHYDRATE, (MACROBID) 100 MG CAPSULE  Take 100 mg by mouth 2 (two) times daily. For 7 days   NITROGLYCERIN (NITRODUR - DOSED IN MG/24 HR) 0.4  MG/HR    Place 1 patch onto the skin every evening. Nitrate free interval from 4pm - 8pm every day   NITROGLYCERIN (NITROSTAT) 0.4 MG SL TABLET    Place 0.4 mg under the tongue every 5 (five) minutes as needed for chest pain (up to 3 doses).    OMEGA-3 FATTY ACIDS (FISH OIL) 1000 MG CAPS    Take 1,000 mg by mouth 2 (two) times daily.   OXYBUTYNIN (DITROPAN-XL) 10 MG 24 HR TABLET    Take 10 mg by mouth at bedtime.   OXYCODONE (OXY IR/ROXICODONE) 5 MG IMMEDIATE RELEASE TABLET    Take 1 tablet (5 mg total) by mouth every 4 (four) hours as needed for severe pain.   PANTOPRAZOLE (PROTONIX) 40 MG TABLET    Take 40 mg by mouth 2 (two) times daily.   POLYETHYLENE GLYCOL (MIRALAX / GLYCOLAX) PACKET    Take 17 g by mouth daily as needed for moderate constipation.    POTASSIUM CHLORIDE (K-DUR,KLOR-CON) 10 MEQ TABLET    Take 10 mEq by mouth daily.   PREDNISONE (DELTASONE) 10 MG TABLET    Take 10-60 mg by mouth daily with breakfast. Take 60 mg by mouth on day 1, take 50 mg by mouth on day 2, take 40 mg by mouth on day 3, take 30 mg by mouth on day 4, take 20 mg by mouth on day 5, then take 10 mg by mouth on day 6.   SENNA-DOCUSATE (SENOKOT-S) 8.6-50 MG PER TABLET    Take 2 tablets by mouth at bedtime.   WARFARIN (COUMADIN) 4 MG TABLET    Take 4 mg by mouth daily.  Modified Medications   No medications on file  Discontinued Medications   No medications on file     Physical Exam: Physical Exam  Constitutional: She is oriented to person, place, and time. She appears well-developed and well-nourished. No distress.  HENT:  Head: Normocephalic and atraumatic.  Right Ear: External ear normal.  Left Ear: External ear normal.  Mouth/Throat: Oropharynx is clear and moist. No oropharyngeal exudate.  Eyes: Conjunctivae and EOM are normal. Pupils are equal, round, and reactive to light. Right eye exhibits no discharge. Left eye exhibits no discharge. No scleral icterus.  Neck: Normal range of motion. Neck supple.  No JVD present. No tracheal deviation present. No thyromegaly present.  Cardiovascular: Normal rate.   Murmur heard. Pulmonary/Chest: No stridor. No respiratory distress. She has no wheezes. She has no rales. She exhibits no tenderness.  Abdominal: She exhibits no distension. There is no tenderness. There is no rebound and no guarding.  Musculoskeletal: Normal range of motion. She exhibits edema and tenderness.  Trace edema. Chronic back pain. Ambulates with walker.   Lymphadenopathy:    She has no cervical adenopathy.  Neurological: She is alert and oriented to person, place, and time. She has normal reflexes. No cranial nerve deficit. She exhibits normal muscle tone. Coordination normal.  Skin: Skin is warm and dry. No rash noted. She is not diaphoretic. No erythema. No pallor.  Coccyx area small scabbed over area from previous pressure injury-healed. Small in mirror imaging pressure ulcers in the intergluteal cleft-not infected.    Psychiatric: She has a normal mood and affect. Her behavior is normal. Judgment and thought content normal.    Filed Vitals:   03/23/14 1442  BP: 120/72  Pulse:  68  Temp: 98.2 F (36.8 C)  TempSrc: Tympanic  Resp: 20      Labs reviewed: Basic Metabolic Panel:  Recent Labs  78/29/56 1949  11/19/13 0320 02/28/14 1127 03/01/14 0420  NA  --   < > 137 142 144  K  --   < > 4.0 3.9 3.8  CL  --   < > 100 103 106  CO2  --   < > 22 28 25   GLUCOSE  --   < > 135* 100* 79  BUN  --   < > 30* 14 13  CREATININE 0.71  < > 0.69 0.65 0.67  CALCIUM  --   < > 9.2 9.7 9.1  MG 2.1  --   --   --  2.2  PHOS  --   --   --   --  3.1  TSH 1.141  --   --   --  6.680*  < > = values in this interval not displayed. Liver Function Tests:  Recent Labs  09/13/13 1420 02/28/14 1802 03/01/14 0420  AST 26 23 22   ALT 23 21 20   ALKPHOS 72 78 78  BILITOT 0.3 0.4 0.4  PROT 6.8 6.7 6.6  ALBUMIN 3.3* 3.0* 2.8*   No results for input(s): LIPASE, AMYLASE in the last  8760 hours. CBC:  Recent Labs  05/05/13 0251  11/19/13 0320 02/28/14 1127 03/01/14 0420  WBC 8.6  < > 13.3* 6.4 7.3  NEUTROABS 6.0  --  11.3* 4.2  --   HGB 9.9*  < > 10.7* 11.0* 11.2*  HCT 29.9*  < > 32.9* 34.4* 35.0*  MCV 94.3  < > 92.7 93.2 93.8  PLT 158  < > 170 195 215  < > = values in this interval not displayed. Lipid Panel:  Recent Labs  05/04/13 1949  CHOL 94  HDL 55  LDLCALC 28  TRIG 53  CHOLHDL 1.7    Past Procedures:  05/04/2013 CLINICAL DATA: Fever. Shortness of breath. EXAM: CHEST 2 VIEW IMPRESSION: Cardiomegaly and interstitial opacity at the bases suggest mild CHF with interstitial pulmonary edema. Pneumonia considered less likely.   05/11/2013 CLINICAL DATA: Cough and congestion. EXAM: PORTABLE CHEST - 1 VIEW IMPRESSION: Mild cardiomegaly with central pulmonary vascular prominence. No segmental consolidation. Calcified tortuous aorta.  Assessment/Plan Pressure ulcer stage II Small about 1cm superficial stage II pressure ulcer in the intergluteal cleft-will apply collagen and silver dressing q3days, pressure reduction, nutritional support. Observe.   Acute exacerbation of chronic low back pain 03/08/14 Gustavus Messing: intractable debilitating bilateral pain in lower back, groin, down the front and back of legs, into knees and calves. Shoulder pain with limited motions. Weightbearing therapies be deferred until after Friday tx has had a chance to work its magic.  03/09/14 spinal inj 03/12/14 03/23/14 Morphine 15mg  q12h prn, schedule Oxycodone 5mg  8am and 12noon and q4h prn for pain control.    Depression Weight loss gradually, will increase Mirtazapine from 7.5mg  to 15mg  po qhs.   Loss of weight Gradually weight loss-1-2 Ibs a week. She denied GI symptoms. Will try increase Mirtazapine from 7.5mg  to 15mg . Dietary supplements. Monitor weight. Obtain CBC, CMP, BNP, and TSH  UTI (lower urinary tract infection) Fully treated and clinically healed.    Paroxysmal atrial fibrillation - on coumadin.  - rate controlled.     Chronic diastolic CHF (congestive heart failure) - compensated- on Lasix and Metoprolol  -03/05/14 CXR no radiographic evidence of acute pulmonary disease.  -  CMP and BNP    Constipation Managed, taking MiraLax daily in addition to Senna S II qhs.     Coronary atherosclerosis Bare-metal stent to RCA 2008, bare-metal stent to LAD-2008, 2 Cypher stents in 2009 to LAD for in-stent restenosis. Prn NGT     Essential hypertension Controlled.       Family/ Staff Communication: observe the patient.   Goals of Care: SNF for strengthening an goal is to return to [place]  Labs/tests ordered: CBC, CMP, BNP, TSH

## 2014-03-23 NOTE — Assessment & Plan Note (Signed)
-   compensated- on Lasix and Metoprolol  -03/05/14 CXR no radiographic evidence of acute pulmonary disease.  - CMP and BNP

## 2014-03-23 NOTE — Assessment & Plan Note (Signed)
Fully treated and clinically healed.  

## 2014-03-23 NOTE — Assessment & Plan Note (Signed)
Small about 1cm superficial stage II pressure ulcer in the intergluteal cleft-will apply collagen and silver dressing q3days, pressure reduction, nutritional support. Observe.

## 2014-03-23 NOTE — Assessment & Plan Note (Signed)
Bare-metal stent to RCA 2008, bare-metal stent to LAD-2008, 2 Cypher stents in 2009 to LAD for in-stent restenosis. Prn NGT

## 2014-03-25 LAB — CBC AND DIFFERENTIAL
HCT: 31 % — AB (ref 36–46)
HEMOGLOBIN: 10.1 g/dL — AB (ref 12.0–16.0)
Platelets: 250 10*3/uL (ref 150–399)
WBC: 9.5 10^3/mL

## 2014-03-25 LAB — POCT INR: INR: 3.9 — AB (ref 0.9–1.1)

## 2014-03-25 LAB — PROTIME-INR: PROTIME: 38 s — AB (ref 10.0–13.8)

## 2014-03-26 ENCOUNTER — Encounter: Payer: Self-pay | Admitting: *Deleted

## 2014-03-26 ENCOUNTER — Other Ambulatory Visit: Payer: Self-pay | Admitting: Nurse Practitioner

## 2014-03-26 DIAGNOSIS — I5032 Chronic diastolic (congestive) heart failure: Secondary | ICD-10-CM

## 2014-03-26 DIAGNOSIS — D638 Anemia in other chronic diseases classified elsewhere: Secondary | ICD-10-CM

## 2014-03-26 LAB — HEPATIC FUNCTION PANEL
ALK PHOS: 90 U/L (ref 25–125)
ALT: 11 U/L (ref 7–35)
AST: 14 U/L (ref 13–35)
Bilirubin, Total: 0.4 mg/dL

## 2014-03-26 LAB — BASIC METABOLIC PANEL
BUN: 19 mg/dL (ref 4–21)
Creatinine: 0.5 mg/dL (ref 0.5–1.1)
Glucose: 101 mg/dL
POTASSIUM: 4.2 mmol/L (ref 3.4–5.3)
Sodium: 137 mmol/L (ref 137–147)

## 2014-03-26 LAB — TSH: TSH: 6.12 u[IU]/mL — AB (ref 0.41–5.90)

## 2014-03-30 ENCOUNTER — Encounter: Payer: Self-pay | Admitting: Nurse Practitioner

## 2014-03-30 ENCOUNTER — Non-Acute Institutional Stay (SKILLED_NURSING_FACILITY): Payer: Medicare Other | Admitting: Nurse Practitioner

## 2014-03-30 ENCOUNTER — Other Ambulatory Visit: Payer: Self-pay | Admitting: Nurse Practitioner

## 2014-03-30 DIAGNOSIS — I482 Chronic atrial fibrillation, unspecified: Secondary | ICD-10-CM

## 2014-03-30 DIAGNOSIS — M545 Low back pain: Secondary | ICD-10-CM

## 2014-03-30 DIAGNOSIS — K5900 Constipation, unspecified: Secondary | ICD-10-CM

## 2014-03-30 DIAGNOSIS — N390 Urinary tract infection, site not specified: Secondary | ICD-10-CM

## 2014-03-30 DIAGNOSIS — I10 Essential (primary) hypertension: Secondary | ICD-10-CM

## 2014-03-30 DIAGNOSIS — F32A Depression, unspecified: Secondary | ICD-10-CM

## 2014-03-30 DIAGNOSIS — I4819 Other persistent atrial fibrillation: Secondary | ICD-10-CM

## 2014-03-30 DIAGNOSIS — L8992 Pressure ulcer of unspecified site, stage 2: Secondary | ICD-10-CM

## 2014-03-30 DIAGNOSIS — F329 Major depressive disorder, single episode, unspecified: Secondary | ICD-10-CM

## 2014-03-30 DIAGNOSIS — G8929 Other chronic pain: Secondary | ICD-10-CM

## 2014-03-30 DIAGNOSIS — I481 Persistent atrial fibrillation: Secondary | ICD-10-CM

## 2014-03-30 DIAGNOSIS — I5032 Chronic diastolic (congestive) heart failure: Secondary | ICD-10-CM

## 2014-03-30 DIAGNOSIS — E039 Hypothyroidism, unspecified: Secondary | ICD-10-CM

## 2014-03-30 NOTE — Progress Notes (Signed)
Patient ID: [name], female   DOB: Oct 02, 1915, 80 y.o.   MRN: [id number]   Code Status: Full Code  Allergies  Allergen Reactions  . Ampicillin Other (See Comments)    Reaction Unknown   . Ciprofloxin Hcl [Ciprofloxacin Hcl] Other (See Comments)    Reaction Unknown, has received Levaquin 12/14  . Demerol Other (See Comments)    Reaction Unknown   . Sulfa Antibiotics Rash    Chief Complaint  Patient presents with  . Medical Management of Chronic Issues  . Acute Visit    thyroid. coccyx pressure area.     HPI: Patient is a 80 y.o. female seen in the SNF at [facility] today for evaluation of thyroid, intergluteal cleft pressure ulcers, intractable back pain,  and other chronic medical conditions.    Hospitalized 02/28/2014-03/04/2014 Acute exacerbation of chronic low back pain and severe constipation.    Hospitalized from 05/04/2013 to 05/11/2013 for SOB, PNA, CHF, generalized weakness. She has a long standing hx of atrial fibrillation, on coumadin, pacemaker, CAD with multiple cardiac stents, history of TIA's, dyslipidemia. CXR in ED  revealed cardiomegaly and interstitial opacity at bases, mild CHF, less likely pneumonia. Because of the fever and hypoxia pt was started on broad spectrum antibiotics for HCAP. Pt has respiratory cultures positive for pseudomonas sensitive to cipro. I spoke with infectious disease, Dr. Orvan Falconer and he recommended treating with total of 8 days (including what she has gotten in hospital). SOB at rest today Problem List Items Addressed This Visit    UTI (lower urinary tract infection)    Fully treated and clinically healed.  03/30/14 Urine culture showed no growth.      Pressure ulcer stage II    No change-Small about 1cm superficial stage II pressure ulcer in the intergluteal cleft-will apply collagen and silver dressing q3days, pressure reduction, nutritional support. Observe.      Paroxysmal atrial fibrillation    - on coumadin.  -  rate controlled.        Hypothyroidism - Primary    03/26/14 TSH 6.123-POA declined Levothyroxine or f/u TSH. Desires to f/u the patient previous PCP.     Essential hypertension    Controlled.      Depression    Weight loss gradually, will increase Mirtazapine from 7.5mg  to 15mg  po qhs.    Constipation    Managed, taking MiraLax daily in addition to Senna S II qhs.       Chronic diastolic CHF (congestive heart failure)    compensated- on Lasix and Metoprolol  03/05/14 CXR no radiographic evidence of acute pulmonary disease.  03/26/14 BNP 116.5       Acute exacerbation of chronic low back pain    03/08/14 Gustavus Messing: intractable debilitating bilateral pain in lower back, groin, down the front and back of legs, into knees and calves. Shoulder pain with limited motions. Weightbearing therapies be deferred until after Friday tx has had a chance to work its magic.  03/09/14 spinal inj 03/12/14 03/23/14 Morphine 15mg  q12h prn, schedule Oxycodone 5mg  8am and 12noon and q4h prn for pain control.  03/30/14 POA desires Oxycodone  8am and 12pm only M-F only. Ortho: doing better after inj-may q67mos prn.       A-fib    - on coumadin.  - rate controlled.          Review of Systems:  Review of Systems  Constitutional: Positive for weight loss and malaise/fatigue. Negative for fever, chills and diaphoresis.  HENT:  Positive for hearing loss. Negative for congestion, ear discharge, ear pain, nosebleeds, sore throat and tinnitus.   Eyes: Negative for blurred vision, double vision, photophobia, pain, discharge and redness.  Respiratory: Positive for shortness of breath. Negative for cough, hemoptysis, sputum production, wheezing and stridor.   Cardiovascular: Positive for leg swelling and PND. Negative for chest pain, palpitations, orthopnea and claudication.       Trace  Gastrointestinal: Positive for constipation. Negative for heartburn, nausea, vomiting, abdominal pain,  diarrhea, blood in stool and melena.  Genitourinary: Positive for frequency. Negative for dysuria, urgency, hematuria and flank pain.  Musculoskeletal: Positive for back pain. Negative for myalgias, joint pain, falls and neck pain.  Skin: Negative for itching and rash.       Coccyx area small scabbed over area from previous pressure injury.   Neurological: Positive for weakness. Negative for dizziness, tingling, tremors, sensory change, speech change, focal weakness, seizures, loss of consciousness and headaches.  Endo/Heme/Allergies: Negative for environmental allergies and polydipsia. Bruises/bleeds easily.  Psychiatric/Behavioral: Negative for depression, suicidal ideas, hallucinations, memory loss and substance abuse. The patient is not nervous/anxious and does not have insomnia.      Past Medical History  Diagnosis Date  . Transient ischemic attack (TIA) 1994  . Atrial fibrillation   . Hypertension   . Dyslipidemia   . Gastritis   . Vitamin B12 deficiency   . Peptic ulcer disease   . Osteopenia   . Macular degeneration, right eye   . Arthritis     back, shoulders  . Cough 05/21/2011    pt states cough x 2 days  . Edema     left leg  . Hypoglycemia   . Urinary incontinence   . CAD 06/15/2009  . Sinoatrial node dysfunction 06/15/2009    Dual-chamber PM inserted 03/18/09 St. Jude Zephyr XL DR dual-chamber, seriel [id number].  . Tachy-brady syndrome     Dual-chamber PM inserted 03/18/09 St. Jude Zephyr XL DR dual-chamber, seriel [id number].  Marland Kitchen CAD (coronary artery disease)     With LAD & RCA BMS, 2008 & LAD DES 2009 for restenosis. Both vessels patent by cath 03/18/09  . Atrial flutter   . Left heart failure   . Dyspnea     frequent  . Rectal bleeding     Aggravated by a combo of coumadin & aspirin. D/C'ed aspirin.  . Chronic diastolic congestive heart failure   . Asthma with COPD   . GERD (gastroesophageal reflux disease)   . Irritable bowel   . Hip fracture, right   . Anorexia  08/27/12    Nearly 20 lbs weight loss over past year. Anorexia weight loss or major problems now & likely represent a slow progression/worsening of the terminal illness, perhaps GI. Probably time to start considering a change in care plan from aggresive/diagnostic to palliative (Dr. Verdis Prime 08/17/12)  . Coronary atherosclerosis of native coronary artery   . Chest discomfort 08/27/12    Nearly continuous.  . Poor appetite    Past Surgical History  Procedure Laterality Date  . Perforated ulcer    . Eye surgery      bilateral  . Pacemaker insertion  03/18/09    For symptomatic brady-tachy syndrome. St[employer] Jude Zephyr XL DR dual-chamber, seriel [id number]  . Hip surgery      broke hip and has pin in it  . Drug-eluting stent  [id number],[id number]    LAD & RCA BMS in 2008 & LAD DES in 2009 for restenosis. Both vessels patent  by cath 03/18/09.  . Cardiac catheterization  03/18/09    With LAD & RCA BMS in 2008 & LAD DES in 2009 for restenosis.  . Varicose vein surgery    . Orif hip fracture Right     Pin in hip  . Cataract extraction Bilateral    Social History:   reports that she quit smoking about 18 years ago. She has never used smokeless tobacco. She reports that she does not drink alcohol or use illicit drugs.  Family History  Problem Relation Age of Onset  . CVA Mother   . Brain cancer Sister   . Heart attack Father   . Lymphoma Son   . CAD Son   . Testicular cancer Brother   . Alzheimer's disease Brother     Medications: Patient's Medications  New Prescriptions   No medications on file  Previous Medications   ACETAMINOPHEN (TYLENOL) 650 MG CR TABLET    Take 650 mg by mouth every 4 (four) hours as needed for pain or fever.    ALBUTEROL (PROVENTIL) (5 MG/ML) 0.5% NEBULIZER SOLUTION    Take 0.5 mLs (2.5 mg total) by nebulization every 6 (six) hours as needed for wheezing or shortness of breath.   ALBUTEROL (VENTOLIN HFA) 108 (90 BASE) MCG/ACT INHALER    Inhale 2 puffs into the lungs every 4  (four) hours as needed for wheezing or shortness of breath. Wait 1 minute between each puff.   ALUMINUM & MAGNESIUM HYDROXIDE-SIMETHICONE (MYLANTA) 500-450-40 MG/5ML SUSPENSION    Take 30 mLs by mouth every 4 (four) hours as needed for indigestion.   ATORVASTATIN (LIPITOR) 10 MG TABLET    Take 10 mg by mouth every evening.    BISACODYL (DULCOLAX) 10 MG SUPPOSITORY    Place 10 mg rectally as needed for moderate constipation.   BUPRENORPHINE (BUTRANS) 7.5 MCG/HR PTWK    Place 7.5 mcg onto the skin once a week.   CALCIUM CARBONATE (OS-CAL) 600 MG TABS    Take 600 mg by mouth 2 (two) times daily.     CHOLECALCIFEROL (VITAMIN D3) 1000 UNITS CAPS    Take 1,000 Units by mouth 2 (two) times daily.    CYANOCOBALAMIN (VITAMIN B-12) 1000 MCG SUBL    Place 1 tablet under the tongue daily.   DICLOFENAC SODIUM (VOLTAREN) 1 % GEL    Apply 2 g topically 4 (four) times daily.   FLUTICASONE-SALMETEROL (ADVAIR DISKUS) 100-50 MCG/DOSE AEPB    Inhale 1 puff into the lungs 2 (two) times daily. Rinse mouth after use.   FUROSEMIDE (LASIX) 40 MG TABLET    Take 40 mg by mouth daily.   GUAIFENESIN (MUCINEX) 600 MG 12 HR TABLET    Take 600 mg by mouth 2 (two) times daily.   MAGNESIUM HYDROXIDE (MILK OF MAGNESIA) 400 MG/5ML SUSPENSION    Take 30 mLs by mouth daily.   METOPROLOL SUCCINATE (TOPROL-XL) 50 MG 24 HR TABLET    Take 1 tablet (50 mg total) by mouth daily. Take with or immediately following a meal.   MIRTAZAPINE (REMERON) 7.5 MG TABLET    Take 1 tablet (7.5 mg total) by mouth at bedtime.   MORPHINE (MS CONTIN) 15 MG 12 HR TABLET    Take 1 tablet (15 mg total) by mouth every 12 (twelve) hours.   MULTIPLE VITAMINS-MINERALS (PRESERVISION AREDS) CAPS    Take 1 capsule by mouth 2 (two) times daily.   NITROFURANTOIN, MACROCRYSTAL-MONOHYDRATE, (MACROBID) 100 MG CAPSULE    Take 100 mg by mouth  2 (two) times daily. For 7 days   NITROGLYCERIN (NITRODUR - DOSED IN MG/24 HR) 0.4 MG/HR    Place 1 patch onto the skin every evening.  Nitrate free interval from 4pm - 8pm every day   NITROGLYCERIN (NITROSTAT) 0.4 MG SL TABLET    Place 0.4 mg under the tongue every 5 (five) minutes as needed for chest pain (up to 3 doses).    OMEGA-3 FATTY ACIDS (FISH OIL) 1000 MG CAPS    Take 1,000 mg by mouth 2 (two) times daily.   OXYBUTYNIN (DITROPAN-XL) 10 MG 24 HR TABLET    Take 10 mg by mouth at bedtime.   OXYCODONE (OXY IR/ROXICODONE) 5 MG IMMEDIATE RELEASE TABLET    Take 1 tablet (5 mg total) by mouth every 4 (four) hours as needed for severe pain.   PANTOPRAZOLE (PROTONIX) 40 MG TABLET    Take 40 mg by mouth 2 (two) times daily.   POLYETHYLENE GLYCOL (MIRALAX / GLYCOLAX) PACKET    Take 17 g by mouth daily as needed for moderate constipation.    POTASSIUM CHLORIDE (K-DUR,KLOR-CON) 10 MEQ TABLET    Take 10 mEq by mouth daily.   PREDNISONE (DELTASONE) 10 MG TABLET    Take 10-60 mg by mouth daily with breakfast. Take 60 mg by mouth on day 1, take 50 mg by mouth on day 2, take 40 mg by mouth on day 3, take 30 mg by mouth on day 4, take 20 mg by mouth on day 5, then take 10 mg by mouth on day 6.   SENNA-DOCUSATE (SENOKOT-S) 8.6-50 MG PER TABLET    Take 2 tablets by mouth at bedtime.   WARFARIN (COUMADIN) 4 MG TABLET    Take 4 mg by mouth daily.  Modified Medications   No medications on file  Discontinued Medications   No medications on file     Physical Exam: Physical Exam  Constitutional: She is oriented to person, place, and time. She appears well-developed and well-nourished. No distress.  HENT:  Head: Normocephalic and atraumatic.  Right Ear: External ear normal.  Left Ear: External ear normal.  Mouth/Throat: Oropharynx is clear and moist. No oropharyngeal exudate.  Eyes: Conjunctivae and EOM are normal. Pupils are equal, round, and reactive to light. Right eye exhibits no discharge. Left eye exhibits no discharge. No scleral icterus.  Neck: Normal range of motion. Neck supple. No JVD present. No tracheal deviation present. No  thyromegaly present.  Cardiovascular: Normal rate.   Murmur heard. Pulmonary/Chest: No stridor. No respiratory distress. She has no wheezes. She has no rales. She exhibits no tenderness.  Abdominal: She exhibits no distension. There is no tenderness. There is no rebound and no guarding.  Musculoskeletal: Normal range of motion. She exhibits edema and tenderness.  Trace edema. Chronic back pain. Ambulates with walker.   Lymphadenopathy:    She has no cervical adenopathy.  Neurological: She is alert and oriented to person, place, and time. She has normal reflexes. No cranial nerve deficit. She exhibits normal muscle tone. Coordination normal.  Skin: Skin is warm and dry. No rash noted. She is not diaphoretic. No erythema. No pallor.  Coccyx area small scabbed over area from previous pressure injury-healed. Small in mirror imaging pressure ulcers in the intergluteal cleft-not infected.    Psychiatric: She has a normal mood and affect. Her behavior is normal. Judgment and thought content normal.    There were no vitals filed for this visit.    Labs reviewed: Basic Metabolic Panel:  Recent Labs  05/04/13 1949  11/19/13 0320 02/28/14 1127 03/01/14 0420 03/26/14  NA  --   < > 137 142 144 137  K  --   < > 4.0 3.9 3.8 4.2  CL  --   < > 100 103 106  --   CO2  --   < > 22 28 25   --   GLUCOSE  --   < > 135* 100* 79  --   BUN  --   < > 30* 14 13 19   CREATININE 0.71  < > 0.69 0.65 0.67 0.5  CALCIUM  --   < > 9.2 9.7 9.1  --   MG 2.1  --   --   --  2.2  --   PHOS  --   --   --   --  3.1  --   TSH 1.141  --   --   --  6.680* 6.12*  < > = values in this interval not displayed. Liver Function Tests:  Recent Labs  09/13/13 1420 02/28/14 1802 03/01/14 0420 03/26/14  AST 26 23 22 14   ALT 23 21 20 11   ALKPHOS 72 78 78 90  BILITOT 0.3 0.4 0.4  --   PROT 6.8 6.7 6.6  --   ALBUMIN 3.3* 3.0* 2.8*  --    No results for input(s): LIPASE, AMYLASE in the last 8760 hours. CBC:  Recent  Labs  05/05/13 0251  11/19/13 0320 02/28/14 1127 03/01/14 0420 03/25/14  WBC 8.6  < > 13.3* 6.4 7.3 9.5  NEUTROABS 6.0  --  11.3* 4.2  --   --   HGB 9.9*  < > 10.7* 11.0* 11.2* 10.1*  HCT 29.9*  < > 32.9* 34.4* 35.0* 31*  MCV 94.3  < > 92.7 93.2 93.8  --   PLT 158  < > 170 195 215 250  < > = values in this interval not displayed. Lipid Panel:  Recent Labs  05/04/13 1949  CHOL 94  HDL 55  LDLCALC 28  TRIG 53  CHOLHDL 1.7    Past Procedures:  05/04/2013 CLINICAL DATA: Fever. Shortness of breath. EXAM: CHEST 2 VIEW IMPRESSION: Cardiomegaly and interstitial opacity at the bases suggest mild CHF with interstitial pulmonary edema. Pneumonia considered less likely.   05/11/2013 CLINICAL DATA: Cough and congestion. EXAM: PORTABLE CHEST - 1 VIEW IMPRESSION: Mild cardiomegaly with central pulmonary vascular prominence. No segmental consolidation. Calcified tortuous aorta.  Assessment/Plan Hypothyroidism 03/26/14 TSH 6.123-POA declined Levothyroxine or f/u TSH. Desires to f/u the patient previous PCP.   Acute exacerbation of chronic low back pain 03/08/14 Gustavus MessingJack Chanda: intractable debilitating bilateral pain in lower back, groin, down the front and back of legs, into knees and calves. Shoulder pain with limited motions. Weightbearing therapies be deferred until after Friday tx has had a chance to work its magic.  03/09/14 spinal inj 03/12/14 03/23/14 Morphine 15mg  q12h prn, schedule Oxycodone 5mg  8am and 12noon and q4h prn for pain control.  03/30/14 POA desires Oxycodone  8am and 12pm only M-F only. Ortho: doing better after inj-may q642mos prn.     A-fib - on coumadin.  - rate controlled.     Chronic diastolic CHF (congestive heart failure) compensated- on Lasix and Metoprolol  03/05/14 CXR no radiographic evidence of acute pulmonary disease.  03/26/14 BNP 116.5     Constipation Managed, taking MiraLax daily in addition to Senna S II qhs.     Depression Weight  loss gradually, will increase  Mirtazapine from 7.5mg  to 15mg  po qhs.  Essential hypertension Controlled.    UTI (lower urinary tract infection) Fully treated and clinically healed.  03/30/14 Urine culture showed no growth.    Paroxysmal atrial fibrillation - on coumadin.  - rate controlled.      Pressure ulcer stage II No change-Small about 1cm superficial stage II pressure ulcer in the intergluteal cleft-will apply collagen and silver dressing q3days, pressure reduction, nutritional support. Observe.      Family/ Staff Communication: observe the patient.   Goals of Care: SNF for strengthening an goal is to return to [place]  Labs/tests ordered: none

## 2014-03-30 NOTE — Assessment & Plan Note (Addendum)
03/08/14 Gustavus MessingJack Munoz: intractable debilitating bilateral pain in lower back, groin, down the front and back of legs, into knees and calves. Shoulder pain with limited motions. Weightbearing therapies be deferred until after Friday tx has had a chance to work its magic.  03/09/14 spinal inj 03/12/14 03/23/14 Morphine 15mg  q12h prn, schedule Oxycodone 5mg  8am and 12noon and q4h prn for pain control.  03/30/14 POA desires Oxycodone  8am and 12pm only M-F only. Ortho: doing better after inj-may q622mos prn.

## 2014-03-30 NOTE — Assessment & Plan Note (Signed)
Managed, taking MiraLax daily in addition to Senna S II qhs.   

## 2014-03-30 NOTE — Assessment & Plan Note (Signed)
No change-Small about 1cm superficial stage II pressure ulcer in the intergluteal cleft-will apply collagen and silver dressing q3days, pressure reduction, nutritional support. Observe.

## 2014-03-30 NOTE — Assessment & Plan Note (Signed)
Controlled.  

## 2014-03-30 NOTE — Assessment & Plan Note (Signed)
Weight loss gradually, will increase Mirtazapine from 7.5mg to 15mg po qhs.  

## 2014-03-30 NOTE — Assessment & Plan Note (Signed)
Fully treated and clinically healed.  03/30/14 Urine culture showed no growth.

## 2014-03-30 NOTE — Assessment & Plan Note (Signed)
-   on coumadin.  - rate controlled.

## 2014-03-30 NOTE — Progress Notes (Signed)
This encounter was created in error - please disregard.

## 2014-03-30 NOTE — Assessment & Plan Note (Signed)
03/26/14 TSH 6.123-POA declined Levothyroxine or f/u TSH. Desires to f/u the patient previous PCP.

## 2014-03-30 NOTE — Assessment & Plan Note (Signed)
compensated- on Lasix and Metoprolol  03/05/14 CXR no radiographic evidence of acute pulmonary disease.  03/26/14 BNP 116.5

## 2014-03-30 NOTE — Assessment & Plan Note (Signed)
-   on coumadin.  - rate controlled.     

## 2014-04-05 ENCOUNTER — Other Ambulatory Visit: Payer: Self-pay | Admitting: *Deleted

## 2014-04-05 MED ORDER — BUPRENORPHINE 7.5 MCG/HR TD PTWK: MEDICATED_PATCH | TRANSDERMAL | Status: DC

## 2014-04-12 ENCOUNTER — Other Ambulatory Visit: Payer: Self-pay | Admitting: *Deleted

## 2014-04-12 MED ORDER — BUPRENORPHINE 7.5 MCG/HR TD PTWK: MEDICATED_PATCH | TRANSDERMAL | Status: DC

## 2014-04-12 NOTE — Telephone Encounter (Signed)
[facility] 

## 2014-04-15 LAB — CBC AND DIFFERENTIAL
HCT: 33 % — AB (ref 36–46)
HEMOGLOBIN: 11 g/dL — AB (ref 12.0–16.0)
PLATELETS: 172 10*3/uL (ref 150–399)
WBC: 11.2 10^3/mL

## 2014-04-15 LAB — BASIC METABOLIC PANEL
BUN: 15 mg/dL (ref 4–21)
Creatinine: 0.7 mg/dL (ref 0.5–1.1)
Potassium: 4 mmol/L (ref 3.4–5.3)
SODIUM: 138 mmol/L (ref 137–147)

## 2014-04-15 LAB — HEPATIC FUNCTION PANEL
ALT: 11 U/L (ref 7–35)
AST: 17 U/L (ref 13–35)
Alkaline Phosphatase: 85 U/L (ref 25–125)
Bilirubin, Total: 0.6 mg/dL

## 2014-04-20 ENCOUNTER — Non-Acute Institutional Stay (SKILLED_NURSING_FACILITY): Payer: Medicare Other | Admitting: Nurse Practitioner

## 2014-04-20 ENCOUNTER — Encounter: Payer: Self-pay | Admitting: Nurse Practitioner

## 2014-04-20 DIAGNOSIS — M545 Low back pain: Secondary | ICD-10-CM

## 2014-04-20 DIAGNOSIS — E039 Hypothyroidism, unspecified: Secondary | ICD-10-CM

## 2014-04-20 DIAGNOSIS — I5032 Chronic diastolic (congestive) heart failure: Secondary | ICD-10-CM

## 2014-04-20 DIAGNOSIS — I482 Chronic atrial fibrillation, unspecified: Secondary | ICD-10-CM

## 2014-04-20 DIAGNOSIS — G8929 Other chronic pain: Secondary | ICD-10-CM

## 2014-04-20 DIAGNOSIS — I2510 Atherosclerotic heart disease of native coronary artery without angina pectoris: Secondary | ICD-10-CM

## 2014-04-20 DIAGNOSIS — N390 Urinary tract infection, site not specified: Secondary | ICD-10-CM

## 2014-04-20 DIAGNOSIS — F329 Major depressive disorder, single episode, unspecified: Secondary | ICD-10-CM

## 2014-04-20 DIAGNOSIS — I10 Essential (primary) hypertension: Secondary | ICD-10-CM

## 2014-04-20 DIAGNOSIS — F32A Depression, unspecified: Secondary | ICD-10-CM

## 2014-04-20 NOTE — Assessment & Plan Note (Signed)
-   on coumadin.  - rate controlled.     

## 2014-04-20 NOTE — Assessment & Plan Note (Signed)
Weight loss gradually, continue Mirtazapine from 15mg  po qhs.

## 2014-04-20 NOTE — Assessment & Plan Note (Signed)
compensated- on Lasix and Metoprolol  03/05/14 CXR no radiographic evidence of acute pulmonary disease.  03/26/14 BNP 116.5 04/14/14 CXR borderline cardiomegaly without evident congestive heart failure.

## 2014-04-20 NOTE — Progress Notes (Signed)
Patient ID: [name], female   DOB: [birth year], 80 y.o.   MRN: [id number]   Code Status: Full Code  Allergies  Allergen Reactions  . Ampicillin Other (See Comments)    Reaction Unknown   . Ciprofloxin Hcl [Ciprofloxacin Hcl] Other (See Comments)    Reaction Unknown, has received Levaquin 12/14  . Demerol Other (See Comments)    Reaction Unknown   . Sulfa Antibiotics Rash    Chief Complaint  Patient presents with  . Medical Management of Chronic Issues  . Acute Visit    lower back and left knee pain.     HPI: Patient is a 80 y.o. female [facility] for evaluation of pain management for the right knee and intractable back pain,  and other chronic medical conditions.    04/16/14 visit: had resolved low grade T about 2 days ago, CBC, CMP, UA 04/15/14 , CXR 04/14/14 unremarkable  Hospitalized 02/28/2014-03/04/2014 Acute exacerbation of chronic low back pain and severe constipation.    Hospitalized from 05/04/2013 to 05/11/2013 for SOB, PNA, CHF, generalized weakness. She has a long standing hx of atrial fibrillation, on coumadin, pacemaker, CAD with multiple cardiac stents, history of TIA's, dyslipidemia. CXR in ED  revealed cardiomegaly and interstitial opacity at bases, mild CHF, less likely pneumonia. Because of the fever and hypoxia pt was started on broad spectrum antibiotics for HCAP. Pt has respiratory cultures positive for pseudomonas sensitive to cipro. I spoke with infectious disease, Dr. Orvan Falconerampbell and he recommended treating with total of 8 days (including what she has gotten in hospital). SOB at rest today Problem List Items Addressed This Visit    UTI (lower urinary tract infection) - Primary    Fully treated. Repeat UA negative for UTI    Hypothyroidism    03/26/14 TSH 6.123-POA declined Levothyroxine or f/u TSH. Desires to f/u the patient previous PCP.      Essential hypertension    Controlled.       Depression    Weight loss  gradually, continue Mirtazapine from 15mg  po qhs.     Coronary atherosclerosis    Bare-metal stent to RCA 2008, bare-metal stent to LAD-2008, 2 Cypher stents in 2009 to LAD for in-stent restenosis. Prn NGT        Chronic diastolic CHF (congestive heart failure)    compensated- on Lasix and Metoprolol  03/05/14 CXR no radiographic evidence of acute pulmonary disease.  03/26/14 BNP 116.5 04/14/14 CXR borderline cardiomegaly without evident congestive heart failure.        Acute exacerbation of chronic low back pain    03/08/14 Gustavus MessingJack Konitzer: intractable debilitating bilateral pain in lower back, groin, down the front and back of legs, into knees and calves. Shoulder pain with limited motions. Weightbearing therapies be deferred until after Friday tx has had a chance to work its magic.  03/09/14 spinal inj 03/12/14 04/05/14 Oxycodone was resumed to 7days/week instead of 5 days/week.  03/30/14 Ortho: up to 3x inj/6 months.  04/16/14 Jack-son-POA: desires to increase Butrans to 5910mcg/hr weekly and reduce Norco I bid and 1/2 bid in pm.      Relevant Medications      buprenorphine (BUTRANS) 10 MCG/HR PTWK patch   A-fib    - on coumadin.  - rate controlled.           Review of Systems:  Review of Systems  Constitutional: Positive for weight loss and malaise/fatigue. Negative for fever, chills and diaphoresis.  HENT:  Positive for hearing loss. Negative for congestion, ear discharge, ear pain, nosebleeds, sore throat and tinnitus.   Eyes: Negative for blurred vision, double vision, photophobia, pain, discharge and redness.  Respiratory: Positive for shortness of breath. Negative for cough, hemoptysis, sputum production, wheezing and stridor.   Cardiovascular: Positive for leg swelling and PND. Negative for chest pain, palpitations, orthopnea and claudication.       Trace RLE. 1+ LLE  Gastrointestinal: Positive for constipation. Negative for heartburn, nausea, vomiting, abdominal  pain, diarrhea, blood in stool and melena.  Genitourinary: Positive for frequency. Negative for dysuria, urgency, hematuria and flank pain.  Musculoskeletal: Positive for back pain and joint pain. Negative for myalgias, falls and neck pain.       Chronic lower back and the right knee pain.   Skin: Negative for itching and rash.       Coccyx area small scabbed over area from previous pressure injury.   Neurological: Positive for weakness. Negative for dizziness, tingling, tremors, sensory change, speech change, focal weakness, seizures, loss of consciousness and headaches.  Endo/Heme/Allergies: Negative for environmental allergies and polydipsia. Bruises/bleeds easily.  Psychiatric/Behavioral: Negative for depression, suicidal ideas, hallucinations, memory loss and substance abuse. The patient is not nervous/anxious and does not have insomnia.      Past Medical History  Diagnosis Date  . Transient ischemic attack (TIA) 1994  . Atrial fibrillation   . Hypertension   . Dyslipidemia   . Gastritis   . Vitamin B12 deficiency   . Peptic ulcer disease   . Osteopenia   . Macular degeneration, right eye   . Arthritis     back, shoulders  . Cough 05/21/2011    pt states cough x 2 days  . Edema     left leg  . Hypoglycemia   . Urinary incontinence   . CAD 06/15/2009  . Sinoatrial node dysfunction 06/15/2009    Dual-chamber PM inserted 03/18/09 St[employer] Jude Zephyr XL DR dual-chamber, seriel [id number].  . Tachy-brady syndrome     Dual-chamber PM inserted 03/18/09 St. Jude Zephyr XL DR dual-chamber, seriel [id number].  Marland Kitchen CAD (coronary artery disease)     With LAD & RCA BMS, 2008 & LAD DES 2009 for restenosis. Both vessels patent by cath 03/18/09  . Atrial flutter   . Left heart failure   . Dyspnea     frequent  . Rectal bleeding     Aggravated by a combo of coumadin & aspirin. D/C'ed aspirin.  . Chronic diastolic congestive heart failure   . Asthma with COPD   . GERD (gastroesophageal reflux disease)    . Irritable bowel   . Hip fracture, right   . Anorexia 08/27/12    Nearly 20 lbs weight loss over past year. Anorexia weight loss or major problems now & likely represent a slow progression/worsening of the terminal illness, perhaps GI. Probably time to start considering a change in care plan from aggresive/diagnostic to palliative (Dr. Verdis Prime 08/17/12)  . Coronary atherosclerosis of native coronary artery   . Chest discomfort 08/27/12    Nearly continuous.  . Poor appetite    Past Surgical History  Procedure Laterality Date  . Perforated ulcer    . Eye surgery      bilateral  . Pacemaker insertion  03/18/09    For symptomatic brady-tachy syndrome. St[employer] Jude Zephyr XL DR dual-chamber, seriel [id number]  . Hip surgery      broke hip and has pin in it  . Drug-eluting stent  [id number]  LAD & RCA BMS in 2008 & LAD DES in 2009 for restenosis. Both vessels patent by cath 03/18/09.  . Cardiac catheterization  03/18/09    With LAD & RCA BMS in 2008 & LAD DES in 2009 for restenosis.  . Varicose vein surgery    . Orif hip fracture Right     Pin in hip  . Cataract extraction Bilateral    Social History:   reports that she quit smoking about 18 years ago. She has never used smokeless tobacco. She reports that she does not drink alcohol or use illicit drugs.  Family History  Problem Relation Age of Onset  . CVA Mother   . Brain cancer Sister   . Heart attack Father   . Lymphoma Son   . CAD Son   . Testicular cancer Brother   . Alzheimer's disease Brother     Medications: Patient's Medications  New Prescriptions   No medications on file  Previous Medications   ACETAMINOPHEN (TYLENOL) 650 MG CR TABLET    Take 650 mg by mouth every 4 (four) hours as needed for pain or fever.    ALBUTEROL (PROVENTIL) (5 MG/ML) 0.5% NEBULIZER SOLUTION    Take 0.5 mLs (2.5 mg total) by nebulization every 6 (six) hours as needed for wheezing or shortness of breath.   ALBUTEROL (VENTOLIN HFA) 108 (90 BASE)  MCG/ACT INHALER    Inhale 2 puffs into the lungs every 4 (four) hours as needed for wheezing or shortness of breath. Wait 1 minute between each puff.   ALUMINUM & MAGNESIUM HYDROXIDE-SIMETHICONE (MYLANTA) 500-450-40 MG/5ML SUSPENSION    Take 30 mLs by mouth every 4 (four) hours as needed for indigestion.   ATORVASTATIN (LIPITOR) 10 MG TABLET    Take 10 mg by mouth every evening.    BISACODYL (DULCOLAX) 10 MG SUPPOSITORY    Place 10 mg rectally as needed for moderate constipation.   BUPRENORPHINE (BUTRANS) 10 MCG/HR PTWK PATCH    Place 10 mcg onto the skin once a week.   CALCIUM CARBONATE (OS-CAL) 600 MG TABS    Take 600 mg by mouth 2 (two) times daily.     CHOLECALCIFEROL (VITAMIN D3) 1000 UNITS CAPS    Take 1,000 Units by mouth 2 (two) times daily.    CYANOCOBALAMIN (VITAMIN B-12) 1000 MCG SUBL    Place 1 tablet under the tongue daily.   DICLOFENAC SODIUM (VOLTAREN) 1 % GEL    Apply 2 g topically 4 (four) times daily.   FLUTICASONE-SALMETEROL (ADVAIR DISKUS) 100-50 MCG/DOSE AEPB    Inhale 1 puff into the lungs 2 (two) times daily. Rinse mouth after use.   FUROSEMIDE (LASIX) 40 MG TABLET    Take 40 mg by mouth daily.   GUAIFENESIN (MUCINEX) 600 MG 12 HR TABLET    Take 600 mg by mouth 2 (two) times daily.   MAGNESIUM HYDROXIDE (MILK OF MAGNESIA) 400 MG/5ML SUSPENSION    Take 30 mLs by mouth daily.   METOPROLOL SUCCINATE (TOPROL-XL) 50 MG 24 HR TABLET    Take 1 tablet (50 mg total) by mouth daily. Take with or immediately following a meal.   MIRTAZAPINE (REMERON) 7.5 MG TABLET    Take 1 tablet (7.5 mg total) by mouth at bedtime.   MORPHINE (MS CONTIN) 15 MG 12 HR TABLET    Take 1 tablet (15 mg total) by mouth every 12 (twelve) hours.   MULTIPLE VITAMINS-MINERALS (PRESERVISION AREDS) CAPS    Take 1 capsule by mouth 2 (two) times  daily.   NITROFURANTOIN, MACROCRYSTAL-MONOHYDRATE, (MACROBID) 100 MG CAPSULE    Take 100 mg by mouth 2 (two) times daily. For 7 days   NITROGLYCERIN (NITRODUR - DOSED IN MG/24  HR) 0.4 MG/HR    Place 1 patch onto the skin every evening. Nitrate free interval from 4pm - 8pm every day   NITROGLYCERIN (NITROSTAT) 0.4 MG SL TABLET    Place 0.4 mg under the tongue every 5 (five) minutes as needed for chest pain (up to 3 doses).    OMEGA-3 FATTY ACIDS (FISH OIL) 1000 MG CAPS    Take 1,000 mg by mouth 2 (two) times daily.   OXYBUTYNIN (DITROPAN-XL) 10 MG 24 HR TABLET    Take 10 mg by mouth at bedtime.   OXYCODONE (OXY IR/ROXICODONE) 5 MG IMMEDIATE RELEASE TABLET    Take 1 tablet (5 mg total) by mouth every 4 (four) hours as needed for severe pain.   PANTOPRAZOLE (PROTONIX) 40 MG TABLET    Take 40 mg by mouth 2 (two) times daily.   POLYETHYLENE GLYCOL (MIRALAX / GLYCOLAX) PACKET    Take 17 g by mouth daily as needed for moderate constipation.    POTASSIUM CHLORIDE (K-DUR,KLOR-CON) 10 MEQ TABLET    Take 10 mEq by mouth daily.   PREDNISONE (DELTASONE) 10 MG TABLET    Take 10-60 mg by mouth daily with breakfast. Take 60 mg by mouth on day 1, take 50 mg by mouth on day 2, take 40 mg by mouth on day 3, take 30 mg by mouth on day 4, take 20 mg by mouth on day 5, then take 10 mg by mouth on day 6.   SENNA-DOCUSATE (SENOKOT-S) 8.6-50 MG PER TABLET    Take 2 tablets by mouth at bedtime.   WARFARIN (COUMADIN) 4 MG TABLET    Take 4 mg by mouth daily.  Modified Medications   No medications on file  Discontinued Medications   BUPRENORPHINE (BUTRANS) 7.5 MCG/HR PTWK    Place 1 patch onto skin once weekly on Saturdays.     Physical Exam: Physical Exam  Constitutional: She is oriented to person, place, and time. She appears well-developed and well-nourished. No distress.  HENT:  Head: Normocephalic and atraumatic.  Right Ear: External ear normal.  Left Ear: External ear normal.  Mouth/Throat: Oropharynx is clear and moist. No oropharyngeal exudate.  Eyes: Conjunctivae and EOM are normal. Pupils are equal, round, and reactive to light. Right eye exhibits no discharge. Left eye exhibits no  discharge. No scleral icterus.  Neck: Normal range of motion. Neck supple. No JVD present. No tracheal deviation present. No thyromegaly present.  Cardiovascular: Normal rate.   Murmur heard. Pulmonary/Chest: No stridor. No respiratory distress. She has no wheezes. She has no rales. She exhibits no tenderness.  Abdominal: She exhibits no distension. There is no tenderness. There is no rebound and no guarding.  Musculoskeletal: Normal range of motion. She exhibits edema and tenderness.  LLE 1+ and RLE trace edema. Chronic back pain and the right knee pain.  Ambulates with walker.   Lymphadenopathy:    She has no cervical adenopathy.  Neurological: She is alert and oriented to person, place, and time. She has normal reflexes. No cranial nerve deficit. She exhibits normal muscle tone. Coordination normal.  Skin: Skin is warm and dry. No rash noted. She is not diaphoretic. No erythema. No pallor.  Coccyx area small scabbed over area from previous pressure injury-healed. Small in mirror imaging pressure ulcers in the intergluteal cleft-not  infected.    Psychiatric: She has a normal mood and affect. Her behavior is normal. Judgment and thought content normal.    Filed Vitals:   04/20/14 1509  BP: 160/86  Pulse: 72  Temp: 99.6 F (37.6 C)  TempSrc: Tympanic  Resp: 20      Labs reviewed: Basic Metabolic Panel:  Recent Labs  16/10/96 1949  11/19/13 0320 02/28/14 1127 03/01/14 0420 03/26/14 04/15/14  NA  --   < > 137 142 144 137 138  K  --   < > 4.0 3.9 3.8 4.2 4.0  CL  --   < > 100 103 106  --   --   CO2  --   < > 22 28 25   --   --   GLUCOSE  --   < > 135* 100* 79  --   --   BUN  --   < > 30* 14 13 19 15   CREATININE 0.71  < > 0.69 0.65 0.67 0.5 0.7  CALCIUM  --   < > 9.2 9.7 9.1  --   --   MG 2.1  --   --   --  2.2  --   --   PHOS  --   --   --   --  3.1  --   --   TSH 1.141  --   --   --  6.680* 6.12*  --   < > = values in this interval not displayed. Liver Function  Tests:  Recent Labs  09/13/13 1420 02/28/14 1802 03/01/14 0420 03/26/14 04/15/14  AST 26 23 22 14 17   ALT 23 21 20 11 11   ALKPHOS 72 78 78 90 85  BILITOT 0.3 0.4 0.4  --   --   PROT 6.8 6.7 6.6  --   --   ALBUMIN 3.3* 3.0* 2.8*  --   --    No results for input(s): LIPASE, AMYLASE in the last 8760 hours. CBC:  Recent Labs  05/05/13 0251  11/19/13 0320 02/28/14 1127 03/01/14 0420 03/25/14 04/15/14  WBC 8.6  < > 13.3* 6.4 7.3 9.5 11.2  NEUTROABS 6.0  --  11.3* 4.2  --   --   --   HGB 9.9*  < > 10.7* 11.0* 11.2* 10.1* 11.0*  HCT 29.9*  < > 32.9* 34.4* 35.0* 31* 33*  MCV 94.3  < > 92.7 93.2 93.8  --   --   PLT 158  < > 170 195 215 250 172  < > = values in this interval not displayed. Lipid Panel:  Recent Labs  05/04/13 1949  CHOL 94  HDL 55  LDLCALC 28  TRIG 53  CHOLHDL 1.7    Past Procedures:  05/04/2013 CLINICAL DATA: Fever. Shortness of breath. EXAM: CHEST 2 VIEW IMPRESSION: Cardiomegaly and interstitial opacity at the bases suggest mild CHF with interstitial pulmonary edema. Pneumonia considered less likely.   05/11/2013 CLINICAL DATA: Cough and congestion. EXAM: PORTABLE CHEST - 1 VIEW IMPRESSION: Mild cardiomegaly with central pulmonary vascular prominence. No segmental consolidation. Calcified tortuous aorta.  04/14/14 CXR   Borderline cardiomegaly without evident congestive heart failure. Basilar subsegmental atelectatic change but otherwise negative for focal pneumonia.   Assessment/Plan UTI (lower urinary tract infection) Fully treated. Repeat UA negative for UTI  Paroxysmal atrial fibrillation - on coumadin.  - rate controlled.      Hypothyroidism 03/26/14 TSH 6.123-POA declined Levothyroxine or f/u TSH. Desires to f/u the patient previous PCP.  Essential hypertension Controlled.     Depression Weight loss gradually, continue Mirtazapine from 15mg  po qhs.   Coronary atherosclerosis Bare-metal stent to RCA 2008, bare-metal stent to  LAD-2008, 2 Cypher stents in 2009 to LAD for in-stent restenosis. Prn NGT      Chronic diastolic CHF (congestive heart failure) compensated- on Lasix and Metoprolol  03/05/14 CXR no radiographic evidence of acute pulmonary disease.  03/26/14 BNP 116.5 04/14/14 CXR borderline cardiomegaly without evident congestive heart failure.      A-fib - on coumadin.  - rate controlled.      Acute exacerbation of chronic low back pain 03/08/14 Gustavus Messing: intractable debilitating bilateral pain in lower back, groin, down the front and back of legs, into knees and calves. Shoulder pain with limited motions. Weightbearing therapies be deferred until after Friday tx has had a chance to work its magic.  03/09/14 spinal inj 03/12/14 04/05/14 Oxycodone was resumed to 7days/week instead of 5 days/week.  03/30/14 Ortho: up to 3x inj/6 months.  04/16/14 Jack-son-POA: desires to increase Butrans to 41mcg/hr weekly and reduce Norco I bid and 1/2 bid in pm.      Family/ Staff Communication: observe the patient.   Goals of Care: SNF for strengthening an goal is to return to [place]  Labs/tests ordered: CBC, CMP, UA done 04/15/14. CXR 04/14/14

## 2014-04-20 NOTE — Assessment & Plan Note (Signed)
03/26/14 TSH 6.123-POA declined Levothyroxine or f/u TSH. Desires to f/u the patient previous PCP.  

## 2014-04-20 NOTE — Assessment & Plan Note (Signed)
03/08/14 [name]: intractable debilitating bilateral pain in lower back, groin, down the front and back of legs, into knees and calves. Shoulder pain with limited motions. Weightbearing therapies be deferred until after Friday tx has had a chance to work its magic.  03/09/14 spinal inj 03/12/14 04/05/14 Oxycodone was resumed to 7days/week instead of 5 days/week.  03/30/14 Ortho: up to 3x inj/6 months.  04/16/14 [name]: desires to increase Butrans to 6310mcg/hr weekly and reduce Norco I bid and 1/2 bid in pm.

## 2014-04-20 NOTE — Assessment & Plan Note (Signed)
Fully treated. Repeat UA negative for UTI

## 2014-04-20 NOTE — Assessment & Plan Note (Signed)
Controlled.  

## 2014-04-20 NOTE — Assessment & Plan Note (Signed)
Bare-metal stent to RCA 2008, bare-metal stent to LAD-2008, 2 Cypher stents in 2009 to LAD for in-stent restenosis. Prn NGT    

## 2014-05-03 ENCOUNTER — Encounter: Payer: Self-pay | Admitting: Internal Medicine

## 2014-05-03 ENCOUNTER — Ambulatory Visit (INDEPENDENT_AMBULATORY_CARE_PROVIDER_SITE_OTHER): Payer: Medicare Other | Admitting: Internal Medicine

## 2014-05-03 VITALS — BP 110/58 | HR 60 | Ht 61.0 in

## 2014-05-03 DIAGNOSIS — I482 Chronic atrial fibrillation, unspecified: Secondary | ICD-10-CM

## 2014-05-03 DIAGNOSIS — I5032 Chronic diastolic (congestive) heart failure: Secondary | ICD-10-CM

## 2014-05-03 DIAGNOSIS — I2510 Atherosclerotic heart disease of native coronary artery without angina pectoris: Secondary | ICD-10-CM

## 2014-05-03 DIAGNOSIS — I495 Sick sinus syndrome: Secondary | ICD-10-CM

## 2014-05-03 LAB — MDC_IDC_ENUM_SESS_TYPE_INCLINIC
Battery Voltage: 2.78 V
Date Time Interrogation Session: 20151221164502
Implantable Pulse Generator Serial Number: 7048529
Lead Channel Pacing Threshold Amplitude: 0.75 V
Lead Channel Pacing Threshold Pulse Width: 0.5 ms
Lead Channel Pacing Threshold Pulse Width: 0.5 ms
Lead Channel Sensing Intrinsic Amplitude: 1 mV
Lead Channel Setting Pacing Amplitude: 2.5 V
Lead Channel Setting Pacing Pulse Width: 0.5 ms
Lead Channel Setting Sensing Sensitivity: 2 mV
MDC IDC MSMT BATTERY IMPEDANCE: 1000 Ohm — AB
MDC IDC MSMT LEADCHNL RA IMPEDANCE VALUE: 424 Ohm
MDC IDC MSMT LEADCHNL RA PACING THRESHOLD AMPLITUDE: 0.75 V
MDC IDC MSMT LEADCHNL RV IMPEDANCE VALUE: 387 Ohm
MDC IDC MSMT LEADCHNL RV SENSING INTR AMPL: 12 mV — AB
MDC IDC SET LEADCHNL RA PACING AMPLITUDE: 2 V

## 2014-05-03 NOTE — Patient Instructions (Signed)
Your physician wants you to follow-up in: 6 months with device clinic and 12 months with Dr Allred You will receive a reminder letter in the mail two months in advance. If you don't receive a letter, please call our office to schedule the follow-up appointment.  

## 2014-05-03 NOTE — Progress Notes (Signed)
[name],Leslie DAVIDSON, MD: Primary Cardiologist:  Dr Warrick ParisianSmith  [name] is a 80 y.o. female with a h/o sick sinus syndrome sp PPM (SJM) by Dr Ladona Ridgelaylor who presents today for follow-up in the Electrophysiology device clinic.   The patient reports doing very well since her last visit.  She is primarily limited by knee pain.  She has SOB with moderate activity.  She has occasional CP at rest.  Today, she  denies symptoms of palpitations, exertional chest pain,   presyncope, syncope, or neurologic sequela.  The patientis tolerating medications without difficulties and is otherwise without complaint today.   Past Medical History  Diagnosis Date  . Transient ischemic attack (TIA) 1994  . Atrial fibrillation   . Hypertension   . Dyslipidemia   . Gastritis   . Vitamin B12 deficiency   . Peptic ulcer disease   . Osteopenia   . Macular degeneration, right eye   . Arthritis     back, shoulders  . Cough 05/21/2011    pt states cough x 2 days  . Edema     left leg  . Hypoglycemia   . Urinary incontinence   . CAD 06/15/2009  . Sinoatrial node dysfunction 06/15/2009    Dual-chamber PM inserted 03/18/09 St. Jude Zephyr XL DR dual-chamber, seriel [id number].  . Tachy-brady syndrome     Dual-chamber PM inserted 03/18/09 St. Jude Zephyr XL DR dual-chamber, seriel [id number].  Marland Kitchen. CAD (coronary artery disease)     With LAD & RCA BMS, 2008 & LAD DES 2009 for restenosis. Both vessels patent by cath 03/18/09  . Atrial flutter   . Left heart failure   . Dyspnea     frequent  . Rectal bleeding     Aggravated by a combo of coumadin & aspirin. D/C'ed aspirin.  . Chronic diastolic congestive heart failure   . Asthma with COPD   . GERD (gastroesophageal reflux disease)   . Irritable bowel   . Hip fracture, right   . Anorexia 08/27/12    Nearly 20 lbs weight loss over past year. Anorexia weight loss or major problems now & likely represent a slow progression/worsening of the terminal illness, perhaps GI.  Probably time to start considering a change in care plan from aggresive/diagnostic to palliative (Dr. Verdis PrimeHenry Smith 08/17/12)  . Coronary atherosclerosis of native coronary artery   . Chest discomfort 08/27/12    Nearly continuous.  . Poor appetite    Past Surgical History  Procedure Laterality Date  . Perforated ulcer    . Eye surgery      bilateral  . Pacemaker insertion  03/18/09    For symptomatic brady-tachy syndrome. St. Jude Zephyr XL DR dual-chamber, seriel [id number]  . Hip surgery      broke hip and has pin in it  . Drug-eluting stent  [id number]    LAD & RCA BMS in 2008 & LAD DES in 2009 for restenosis. Both vessels patent by cath 03/18/09.  . Cardiac catheterization  03/18/09    With LAD & RCA BMS in 2008 & LAD DES in 2009 for restenosis.  . Varicose vein surgery    . Orif hip fracture Right     Pin in hip  . Cataract extraction Bilateral     History   Social History  . Marital Status: Widowed    Spouse Name: N/A    Number of Children: N/A  . Years of Education: N/A   Occupational History  . Not on file.  Social History Main Topics  . Smoking status: Former Smoker    Quit date: 03/14/1996  . Smokeless tobacco: Never Used  . Alcohol Use: No  . Drug Use: No  . Sexual Activity: No   Other Topics Concern  . Not on file   Social History Narrative   Widow, lives in the assisted living division of [facility]    Family History  Problem Relation Age of Onset  . CVA Mother   . Brain cancer Sister   . Heart attack Father   . Lymphoma Son   . CAD Son   . Testicular cancer Brother   . Alzheimer's disease Brother     Allergies  Allergen Reactions  . Ampicillin Other (See Comments)    Reaction Unknown   . Ciprofloxin Hcl [Ciprofloxacin Hcl] Other (See Comments)    Reaction Unknown, has received Levaquin 12/14  . Demerol Other (See Comments)    Reaction Unknown   . Sulfa Antibiotics Rash    Current Outpatient Prescriptions  Medication Sig Dispense Refill    . acetaminophen (TYLENOL) 325 MG tablet Take 650 mg by mouth every 4 (four) hours as needed (pain/fever).    Marland Kitchen. albuterol (PROVENTIL) (5 MG/ML) 0.5% nebulizer solution Take 0.5 mLs (2.5 mg total) by nebulization every 6 (six) hours as needed for wheezing or shortness of breath. 20 mL 12  . albuterol (VENTOLIN HFA) 108 (90 BASE) MCG/ACT inhaler Inhale 2 puffs into the lungs every 4 (four) hours as needed for wheezing or shortness of breath. Wait 1 minute between each puff.    Marland Kitchen. aluminum & magnesium hydroxide-simethicone (MYLANTA) 500-450-40 MG/5ML suspension Take 30 mLs by mouth every 4 (four) hours as needed for indigestion.    Marland Kitchen. atorvastatin (LIPITOR) 10 MG tablet Take 10 mg by mouth every evening.     . bisacodyl (DULCOLAX) 10 MG suppository Place 10 mg rectally daily as needed for moderate constipation (CONSTIPATION).     Marland Kitchen. buprenorphine (BUTRANS) 10 MCG/HR PTWK patch Place 10 mcg onto the skin once a week.    . calcium carbonate (OS-CAL) 600 MG TABS Take 600 mg by mouth 2 (two) times daily.      . Cholecalciferol (VITAMIN D3) 1000 UNITS CAPS Take 1,000 Units by mouth 2 (two) times daily.     . Cyanocobalamin (VITAMIN B-12) 1000 MCG SUBL Place 1 tablet under the tongue daily.    . Fluticasone Furoate-Vilanterol (BREO ELLIPTA) 100-25 MCG/INH AEPB Inhale 1 puff into the lungs daily.    . furosemide (LASIX) 40 MG tablet Take 40 mg by mouth daily.    Marland Kitchen. guaiFENesin (MUCINEX) 600 MG 12 hr tablet Take 600 mg by mouth 2 (two) times daily.    . magnesium hydroxide (MILK OF MAGNESIA) 400 MG/5ML suspension Take 30 mLs by mouth daily. 360 mL 0  . metoprolol succinate (TOPROL-XL) 50 MG 24 hr tablet Take 1 tablet (50 mg total) by mouth daily. Take with or immediately following a meal.    . mirtazapine (REMERON) 15 MG tablet Take 15 mg by mouth at bedtime.    Marland Kitchen. morphine (MS CONTIN) 15 MG 12 hr tablet Take 1 tablet (15 mg total) by mouth every 12 (twelve) hours. 30 tablet 0  . Multiple Vitamins-Minerals  (PRESERVISION AREDS) CAPS Take 2 capsules by mouth 2 (two) times daily.     . nitroGLYCERIN (NITRODUR - DOSED IN MG/24 HR) 0.4 mg/hr Place 1 patch onto the skin every evening. Nitrate free interval from 4pm - 8pm every day    .  nitroGLYCERIN (NITROSTAT) 0.4 MG SL tablet Place 0.4 mg under the tongue every 5 (five) minutes as needed for chest pain (up to 3 doses).     Marland Kitchen omeprazole (PRILOSEC) 20 MG capsule Take 1 capsule by mouth daily.    Marland Kitchen oxybutynin (DITROPAN-XL) 10 MG 24 hr tablet Take 10 mg by mouth at bedtime.    Marland Kitchen oxyCODONE (OXY IR/ROXICODONE) 5 MG immediate release tablet Take 1 tablet (5 mg total) by mouth every 4 (four) hours as needed for severe pain. (Patient taking differently: Take 5 mg by mouth every 4 (four) hours as needed for severe pain. 8am and 12 pm except Sat and Sun.) 30 tablet 0  . polyethylene glycol (MIRALAX / GLYCOLAX) packet Take 17 g by mouth daily as needed for moderate constipation.     . potassium chloride (K-DUR,KLOR-CON) 10 MEQ tablet Take 10 mEq by mouth daily.    Marland Kitchen senna-docusate (SENOKOT-S) 8.6-50 MG per tablet Take 2 tablets by mouth at bedtime. (Patient taking differently: Take 2 tablets by mouth 2 (two) times daily. )    . warfarin (COUMADIN) 3 MG tablet Take as directed by the coumadin clinic    . warfarin (COUMADIN) 4 MG tablet Take as directed by the coumadin clinic     No current facility-administered medications for this visit.    ROS- all systems are reviewed and negative except as per HPI  Physical Exam: Filed Vitals:   05/03/14 1619  BP: 110/58  Pulse: 60  Height: 5\' 1"  (1.549 m)    GEN- The patient is elderly appearing, alert and oriented x 3 today.   Head- normocephalic, atraumatic Eyes-  Sclera clear, conjunctiva pink Ears- hearing intact Oropharynx- clear Neck- supple  Lungs- Clear to ausculation bilaterally, normal work of breathing Chest- pacemaker pocket is well healed Heart- Regular rate and rhythm  GI- soft, NT, ND, +  BS Extremities- no clubbing, cyanosis, + venous stasis changes with L>R edema MS- no significant deformity or atrophy Skin- no rash or lesion Psych- euthymic mood, full affect Neuro- strength and sensation are intact  Pacemaker interrogation- reviewed in detail today,  See PACEART report  Assessment and Plan:  1. Sick sinus syndrome Normal pacemaker function See Pace Art report No changes today  2. afib Well controlled chads2vasc score is at least 7 Continue coumadin long term  3. CAD Stable angina Given advanced age, would anticipate medical management long term  Return to the device clinic in 6 months I will see in a year Follow-up with Dr Katrinka Blazing as scheduled

## 2014-05-04 ENCOUNTER — Telehealth: Payer: Self-pay | Admitting: Internal Medicine

## 2014-05-04 ENCOUNTER — Encounter: Payer: Self-pay | Admitting: Internal Medicine

## 2014-05-04 NOTE — Telephone Encounter (Signed)
New Msg    Pt son Ree KidaJack calling, [facility] and was given [facility] name tag with a magnet.    Is it ok to have this with the pacemaker? Please call and advise.

## 2014-05-04 NOTE — Telephone Encounter (Signed)
Left message for [name](son) that depending on the strength of the magnet she may wear the name tag on the opposite side of her chest from the PPM.

## 2014-06-07 ENCOUNTER — Other Ambulatory Visit: Payer: Self-pay | Admitting: Internal Medicine

## 2014-06-07 DIAGNOSIS — R609 Edema, unspecified: Secondary | ICD-10-CM

## 2014-06-09 ENCOUNTER — Other Ambulatory Visit: Payer: Self-pay

## 2014-06-10 ENCOUNTER — Ambulatory Visit
Admission: RE | Admit: 2014-06-10 | Discharge: 2014-06-10 | Disposition: A | Discharge status: Home / Self Care | Payer: Medicare Other | Source: Ambulatory Visit | Attending: Internal Medicine | Admitting: Internal Medicine

## 2014-06-10 DIAGNOSIS — R609 Edema, unspecified: Secondary | ICD-10-CM

## 2014-07-27 ENCOUNTER — Non-Acute Institutional Stay (SKILLED_NURSING_FACILITY): Payer: Medicare Other | Admitting: Nurse Practitioner

## 2014-07-27 ENCOUNTER — Encounter: Payer: Self-pay | Admitting: Nurse Practitioner

## 2014-07-27 DIAGNOSIS — N318 Other neuromuscular dysfunction of bladder: Secondary | ICD-10-CM | POA: Diagnosis not present

## 2014-07-27 DIAGNOSIS — M545 Low back pain, unspecified: Secondary | ICD-10-CM

## 2014-07-27 DIAGNOSIS — I482 Chronic atrial fibrillation, unspecified: Secondary | ICD-10-CM

## 2014-07-27 DIAGNOSIS — K5900 Constipation, unspecified: Secondary | ICD-10-CM | POA: Diagnosis not present

## 2014-07-27 DIAGNOSIS — F329 Major depressive disorder, single episode, unspecified: Secondary | ICD-10-CM

## 2014-07-27 DIAGNOSIS — D638 Anemia in other chronic diseases classified elsewhere: Secondary | ICD-10-CM | POA: Diagnosis not present

## 2014-07-27 DIAGNOSIS — R5381 Other malaise: Secondary | ICD-10-CM | POA: Insufficient documentation

## 2014-07-27 DIAGNOSIS — I10 Essential (primary) hypertension: Secondary | ICD-10-CM | POA: Diagnosis not present

## 2014-07-27 DIAGNOSIS — K219 Gastro-esophageal reflux disease without esophagitis: Secondary | ICD-10-CM | POA: Diagnosis not present

## 2014-07-27 DIAGNOSIS — E039 Hypothyroidism, unspecified: Secondary | ICD-10-CM

## 2014-07-27 DIAGNOSIS — R791 Abnormal coagulation profile: Secondary | ICD-10-CM

## 2014-07-27 DIAGNOSIS — I5032 Chronic diastolic (congestive) heart failure: Secondary | ICD-10-CM

## 2014-07-27 DIAGNOSIS — G8929 Other chronic pain: Secondary | ICD-10-CM

## 2014-07-27 DIAGNOSIS — F32A Depression, unspecified: Secondary | ICD-10-CM

## 2014-07-27 NOTE — Progress Notes (Signed)
Patient ID: [name], female   DOB: [birth year], 81 y.o.   MRN: [id number]   Code Status: DNR  Allergies  Allergen Reactions  . Ampicillin Other (See Comments)    Reaction Unknown   . Ciprofloxin Hcl [Ciprofloxacin Hcl] Other (See Comments)    Reaction Unknown, has received Levaquin 12/14  . Demerol Other (See Comments)    Reaction Unknown   . Sulfa Antibiotics Rash    Chief Complaint  Patient presents with  . Medical Management of Chronic Issues  . Acute Visit    deconditioning.     HPI: Patient is a 81 y.o. female seen in the SNF at [facility] today for evaluation of deconditioning and chronic medical conditions.  Problem List Items Addressed This Visit    Essential hypertension    Controlled.         Chronic diastolic CHF (congestive heart failure)    compensated- on Lasix and Metoprolol  03/05/14 CXR no radiographic evidence of acute pulmonary disease.  03/26/14 BNP 116.5 04/14/14 CXR borderline cardiomegaly without evident congestive heart failure.         Anemia of chronic disease    03/25/14 Hgb 10.1      Supratherapeutic INR    07/26/14 INR 4.82-Coumadin on hold.       GERD (gastroesophageal reflux disease)    Stable, takes Omeprazole 20mg  daily.       Constipation    Managed, taking MiraLax qod, Senna S III qd, MOM qd,  prn Feelts edema        Hypertonicity of bladder    Managed with Oxybutynin 10mg  daily.        A-fib    - on coumadin.  - rate controlled.        Acute exacerbation of chronic low back pain    Pain is managed with Butrans and prn Morphine and prn Tylenol.        Depression    Weight loss gradually, continue Mirtazapine 15mg  po qhs         Hypothyroidism    03/26/14 TSH 6.123-not taking thyroid supplement.        Physical deconditioning - Primary    Resulted in SNF for care needs. Continue Hospice Service. Continue Butrans, prn Morphine, and prn Lorazepam for comfort measures.           Review of Systems:  Review of Systems  Constitutional: Positive for activity change, appetite change and fatigue. Negative for fever, chills, diaphoresis and unexpected weight change.  HENT: Positive for hearing loss. Negative for congestion, ear discharge, ear pain, nosebleeds, sore throat and tinnitus.   Eyes: Negative for photophobia, pain, discharge and redness.  Respiratory: Positive for shortness of breath. Negative for cough, wheezing and stridor.   Cardiovascular: Positive for leg swelling. Negative for chest pain and palpitations.       Trace RLE. 1+ LLE  Gastrointestinal: Positive for constipation. Negative for nausea, vomiting, abdominal pain, diarrhea and blood in stool.  Endocrine: Negative for polydipsia.  Genitourinary: Positive for frequency. Negative for dysuria, urgency, hematuria and flank pain.  Musculoskeletal: Positive for back pain. Negative for myalgias and neck pain.       Chronic lower back and the right knee pain.   Skin: Negative for rash.       Coccyx area small scabbed over area from previous pressure injury.   Allergic/Immunologic: Negative for environmental allergies.  Neurological: Positive for weakness. Negative for dizziness, tremors, seizures and headaches.  Hematological: Bruises/bleeds  easily.  Psychiatric/Behavioral: Negative for suicidal ideas and hallucinations. The patient is not nervous/anxious.      Past Medical History  Diagnosis Date  . Transient ischemic attack (TIA) 1994  . Atrial fibrillation   . Hypertension   . Dyslipidemia   . Gastritis   . Vitamin B12 deficiency   . Peptic ulcer disease   . Osteopenia   . Macular degeneration, right eye   . Arthritis     back, shoulders  . Cough 05/21/2011    pt states cough x 2 days  . Edema     left leg  . Hypoglycemia   . Urinary incontinence   . CAD 06/15/2009  . Sinoatrial node dysfunction 06/15/2009    Dual-chamber PM inserted 03/18/09 St. Jude Zephyr XL DR dual-chamber, seriel  [id number].  . Tachy-brady syndrome     Dual-chamber PM inserted 03/18/09 St. Jude Zephyr XL DR dual-chamber, seriel [id number].  Marland Kitchen CAD (coronary artery disease)     With LAD & RCA BMS, 2008 & LAD DES 2009 for restenosis. Both vessels patent by cath 03/18/09  . Atrial flutter   . Left heart failure   . Dyspnea     frequent  . Rectal bleeding     Aggravated by a combo of coumadin & aspirin. D/C'ed aspirin.  . Chronic diastolic congestive heart failure   . Asthma with COPD   . GERD (gastroesophageal reflux disease)   . Irritable bowel   . Hip fracture, right   . Anorexia 08/27/12    Nearly 20 lbs weight loss over past year. Anorexia weight loss or major problems now & likely represent a slow progression/worsening of the terminal illness, perhaps GI. Probably time to start considering a change in care plan from aggresive/diagnostic to palliative (Dr. Verdis Prime 08/17/12)  . Coronary atherosclerosis of native coronary artery   . Chest discomfort 08/27/12    Nearly continuous.  . Poor appetite    Past Surgical History  Procedure Laterality Date  . Perforated ulcer    . Eye surgery      bilateral  . Pacemaker insertion  03/18/09    For symptomatic brady-tachy syndrome. St. Jude Zephyr XL DR dual-chamber, seriel [id number]  . Hip surgery      broke hip and has pin in it  . Drug-eluting stent  [id number]    LAD & RCA BMS in 2008 & LAD DES in 2009 for restenosis. Both vessels patent by cath 03/18/09.  . Cardiac catheterization  03/18/09    With LAD & RCA BMS in 2008 & LAD DES in 2009 for restenosis.  . Varicose vein surgery    . Orif hip fracture Right     Pin in hip  . Cataract extraction Bilateral    Social History:   reports that she quit smoking about 18 years ago. She has never used smokeless tobacco. She reports that she does not drink alcohol or use illicit drugs.  Family History  Problem Relation Age of Onset  . CVA Mother   . Brain cancer Sister   . Heart attack Father   . Lymphoma  Son   . CAD Son   . Testicular cancer Brother   . Alzheimer's disease Brother     Medications: Patient's Medications  New Prescriptions   No medications on file  Previous Medications   ACETAMINOPHEN (TYLENOL) 325 MG TABLET    Take 650 mg by mouth every 4 (four) hours as needed (pain/fever).   ALBUTEROL (PROVENTIL) (5 MG/ML) 0.5% NEBULIZER  SOLUTION    Take 0.5 mLs (2.5 mg total) by nebulization every 6 (six) hours as needed for wheezing or shortness of breath.   ALBUTEROL (VENTOLIN HFA) 108 (90 BASE) MCG/ACT INHALER    Inhale 2 puffs into the lungs every 4 (four) hours as needed for wheezing or shortness of breath. Wait 1 minute between each puff.   ALUMINUM & MAGNESIUM HYDROXIDE-SIMETHICONE (MYLANTA) 500-450-40 MG/5ML SUSPENSION    Take 30 mLs by mouth every 4 (four) hours as needed for indigestion.   ATORVASTATIN (LIPITOR) 10 MG TABLET    Take 10 mg by mouth every evening.    BISACODYL (DULCOLAX) 10 MG SUPPOSITORY    Place 10 mg rectally daily as needed for moderate constipation (CONSTIPATION).    BUPRENORPHINE (BUTRANS) 10 MCG/HR PTWK PATCH    Place 10 mcg onto the skin once a week.   CALCIUM CARBONATE (OS-CAL) 600 MG TABS    Take 600 mg by mouth 2 (two) times daily.     CHOLECALCIFEROL (VITAMIN D3) 1000 UNITS CAPS    Take 1,000 Units by mouth 2 (two) times daily.    CYANOCOBALAMIN (VITAMIN B-12) 1000 MCG SUBL    Place 1 tablet under the tongue daily.   FLUTICASONE FUROATE-VILANTEROL (BREO ELLIPTA) 100-25 MCG/INH AEPB    Inhale 1 puff into the lungs daily.   FUROSEMIDE (LASIX) 40 MG TABLET    Take 40 mg by mouth daily.   GUAIFENESIN (MUCINEX) 600 MG 12 HR TABLET    Take 600 mg by mouth 2 (two) times daily.   MAGNESIUM HYDROXIDE (MILK OF MAGNESIA) 400 MG/5ML SUSPENSION    Take 30 mLs by mouth daily.   METOPROLOL SUCCINATE (TOPROL-XL) 50 MG 24 HR TABLET    Take 1 tablet (50 mg total) by mouth daily. Take with or immediately following a meal.   MIRTAZAPINE (REMERON) 15 MG TABLET    Take 15  mg by mouth at bedtime.   MORPHINE (MS CONTIN) 15 MG 12 HR TABLET    Take 1 tablet (15 mg total) by mouth every 12 (twelve) hours.   MULTIPLE VITAMINS-MINERALS (PRESERVISION AREDS) CAPS    Take 2 capsules by mouth 2 (two) times daily.    NITROGLYCERIN (NITRODUR - DOSED IN MG/24 HR) 0.4 MG/HR    Place 1 patch onto the skin every evening. Nitrate free interval from 4pm - 8pm every day   NITROGLYCERIN (NITROSTAT) 0.4 MG SL TABLET    Place 0.4 mg under the tongue every 5 (five) minutes as needed for chest pain (up to 3 doses).    OMEPRAZOLE (PRILOSEC) 20 MG CAPSULE    Take 1 capsule by mouth daily.   OXYBUTYNIN (DITROPAN-XL) 10 MG 24 HR TABLET    Take 10 mg by mouth at bedtime.   OXYCODONE (OXY IR/ROXICODONE) 5 MG IMMEDIATE RELEASE TABLET    Take 1 tablet (5 mg total) by mouth every 4 (four) hours as needed for severe pain.   POLYETHYLENE GLYCOL (MIRALAX / GLYCOLAX) PACKET    Take 17 g by mouth daily as needed for moderate constipation.    POTASSIUM CHLORIDE (K-DUR,KLOR-CON) 10 MEQ TABLET    Take 10 mEq by mouth daily.   SENNA-DOCUSATE (SENOKOT-S) 8.6-50 MG PER TABLET    Take 2 tablets by mouth at bedtime.   WARFARIN (COUMADIN) 3 MG TABLET    Take as directed by the coumadin clinic   WARFARIN (COUMADIN) 4 MG TABLET    Take as directed by the coumadin clinic  Modified Medications   No  medications on file  Discontinued Medications   No medications on file     Physical Exam: Physical Exam  Constitutional: She is oriented to person, place, and time. She appears well-developed and well-nourished. No distress.  HENT:  Head: Normocephalic and atraumatic.  Right Ear: External ear normal.  Left Ear: External ear normal.  Mouth/Throat: Oropharynx is clear and moist. No oropharyngeal exudate.  Eyes: Conjunctivae and EOM are normal. Pupils are equal, round, and reactive to light. Right eye exhibits no discharge. Left eye exhibits no discharge. No scleral icterus.  Neck: Normal range of motion. Neck  supple. No JVD present. No tracheal deviation present. No thyromegaly present.  Cardiovascular: Normal rate.   Murmur heard. Pulmonary/Chest: No stridor. No respiratory distress. She has no wheezes. She has no rales. She exhibits no tenderness.  Abdominal: She exhibits no distension. There is no tenderness. There is no rebound and no guarding.  Musculoskeletal: Normal range of motion. She exhibits edema and tenderness.  BLR trace edema. Chronic back pain and the right knee pain.  Ambulates with walker.   Lymphadenopathy:    She has no cervical adenopathy.  Neurological: She is alert and oriented to person, place, and time. She has normal reflexes. No cranial nerve deficit. She exhibits normal muscle tone. Coordination normal.  Skin: Skin is warm and dry. No rash noted. She is not diaphoretic. No erythema. No pallor.  Coccyx area small scabbed over area from previous pressure injury-healed. Small in mirror imaging pressure ulcers in the intergluteal cleft-not infected.    Psychiatric: She has a normal mood and affect. Her behavior is normal. Judgment and thought content normal.   Filed Vitals:   07/27/14 1243  BP: 118/104  Pulse: 96  Temp: 99.6 F (37.6 C)  TempSrc: Tympanic  Resp: 20      Labs reviewed: Basic Metabolic Panel:  Recent Labs  16/02/9606/09/15 0320 02/28/14 1127 03/01/14 0420 03/26/14 04/15/14  NA 137 142 144 137 138  K 4.0 3.9 3.8 4.2 4.0  CL 100 103 106  --   --   CO2 22 28 25   --   --   GLUCOSE 135* 100* 79  --   --   BUN 30* 14 13 19 15   CREATININE 0.69 0.65 0.67 0.5 0.7  CALCIUM 9.2 9.7 9.1  --   --   MG  --   --  2.2  --   --   PHOS  --   --  3.1  --   --   TSH  --   --  6.680* 6.12*  --    Liver Function Tests:  Recent Labs  09/13/13 1420 02/28/14 1802 03/01/14 0420 03/26/14 04/15/14  AST 26 23 22 14 17   ALT 23 21 20 11 11   ALKPHOS 72 78 78 90 85  BILITOT 0.3 0.4 0.4  --   --   PROT 6.8 6.7 6.6  --   --   ALBUMIN 3.3* 3.0* 2.8*  --   --    No  results for input(s): LIPASE, AMYLASE in the last 8760 hours. No results for input(s): AMMONIA in the last 8760 hours. CBC:  Recent Labs  11/19/13 0320 02/28/14 1127 03/01/14 0420 03/25/14 04/15/14  WBC 13.3* 6.4 7.3 9.5 11.2  NEUTROABS 11.3* 4.2  --   --   --   HGB 10.7* 11.0* 11.2* 10.1* 11.0*  HCT 32.9* 34.4* 35.0* 31* 33*  MCV 92.7 93.2 93.8  --   --   PLT 170  195 215 250 172   Lipid Panel: No results for input(s): CHOL, HDL, LDLCALC, TRIG, CHOLHDL, LDLDIRECT in the last 8760 hours.  Past Procedures:  05/04/2013 EXAM: CHEST 2 VIEW IMPRESSION: Cardiomegaly and interstitial opacity at the bases suggest mild CHF with interstitial pulmonary edema. Pneumonia considered less likely.   05/11/2013 EXAM: PORTABLE CHEST - 1 VIEW IMPRESSION: Mild cardiomegaly with central pulmonary vascular prominence. No segmental consolidation. Calcified tortuous aorta.  04/14/14 CXR  Borderline cardiomegaly without evident congestive heart failure. Basilar subsegmental atelectatic change but otherwise negative for focal pneumonia.   Assessment/Plan Physical deconditioning Resulted in SNF for care needs. Continue Hospice Service. Continue Butrans, prn Morphine, and prn Lorazepam for comfort measures.    Depression Weight loss gradually, continue Mirtazapine 15mg  po qhs      Constipation Managed, taking MiraLax qod, Senna S III qd, MOM qd,  prn Feelts edema     Supratherapeutic INR 07/26/14 INR 4.82-Coumadin on hold.    Hypertonicity of bladder Managed with Oxybutynin 10mg  daily.     GERD (gastroesophageal reflux disease) Stable, takes Omeprazole 20mg  daily.    Chronic diastolic CHF (congestive heart failure) compensated- on Lasix and Metoprolol  03/05/14 CXR no radiographic evidence of acute pulmonary disease.  03/26/14 BNP 116.5 04/14/14 CXR borderline cardiomegaly without evident congestive heart failure.      Essential hypertension Controlled.       A-fib - on coumadin.  - rate controlled.     Acute exacerbation of chronic low back pain Pain is managed with Butrans and prn Morphine and prn Tylenol.     Anemia of chronic disease 03/25/14 Hgb 10.1   Hypothyroidism 03/26/14 TSH 6.123-not taking thyroid supplement.       Family/ Staff Communication: observe the patient.   Goals of Care: SNF Hospice Service.  Labs/tests ordered: none

## 2014-07-27 NOTE — Assessment & Plan Note (Signed)
07/26/14 INR 4.82-Coumadin on hold.

## 2014-07-27 NOTE — Assessment & Plan Note (Signed)
Weight loss gradually, continue Mirtazapine 15mg  po qhs

## 2014-07-27 NOTE — Assessment & Plan Note (Signed)
Resulted in SNF for care needs. Continue Hospice Service. Continue Butrans, prn Morphine, and prn Lorazepam for comfort measures.

## 2014-07-27 NOTE — Assessment & Plan Note (Signed)
Controlled.  

## 2014-07-27 NOTE — Assessment & Plan Note (Signed)
Pain is managed with Butrans and prn Morphine and prn Tylenol.

## 2014-07-27 NOTE — Assessment & Plan Note (Signed)
Stable, takes Omeprazole 20mg daily.  

## 2014-07-27 NOTE — Assessment & Plan Note (Signed)
03/26/14 TSH 6.123-not taking thyroid supplement.

## 2014-07-27 NOTE — Assessment & Plan Note (Signed)
-   on coumadin.  - rate controlled.     

## 2014-07-27 NOTE — Assessment & Plan Note (Addendum)
Managed, taking MiraLax qod, Senna S III qd, MOM qd,  prn Feelts edema

## 2014-07-27 NOTE — Assessment & Plan Note (Signed)
Managed with Oxybutynin 10mg daily 

## 2014-07-27 NOTE — Assessment & Plan Note (Signed)
compensated- on Lasix and Metoprolol  03/05/14 CXR no radiographic evidence of acute pulmonary disease.  03/26/14 BNP 116.5 04/14/14 CXR borderline cardiomegaly without evident congestive heart failure.     

## 2014-07-27 NOTE — Assessment & Plan Note (Signed)
03/25/14 Hgb 10.1

## 2014-07-30 ENCOUNTER — Encounter: Payer: Self-pay | Admitting: Internal Medicine

## 2014-07-30 ENCOUNTER — Non-Acute Institutional Stay (SKILLED_NURSING_FACILITY): Payer: Medicare Other | Admitting: Internal Medicine

## 2014-07-30 DIAGNOSIS — I482 Chronic atrial fibrillation, unspecified: Secondary | ICD-10-CM

## 2014-07-30 DIAGNOSIS — R627 Adult failure to thrive: Secondary | ICD-10-CM | POA: Diagnosis not present

## 2014-07-30 DIAGNOSIS — R634 Abnormal weight loss: Secondary | ICD-10-CM | POA: Diagnosis not present

## 2014-07-30 DIAGNOSIS — I5032 Chronic diastolic (congestive) heart failure: Secondary | ICD-10-CM | POA: Diagnosis not present

## 2014-07-30 DIAGNOSIS — I10 Essential (primary) hypertension: Secondary | ICD-10-CM

## 2014-07-30 HISTORY — DX: Adult failure to thrive: R62.7

## 2014-07-30 NOTE — Progress Notes (Signed)
Patient ID: [name], female   DOB: 1915-05-16, 81 y.o.   MRN: [id number]    HISTORY AND PHYSICAL  Location:  [facility]  Nursing Home Room Number: N 19 Place of Service: SNF (31)   Extended Emergency Contact Information Primary Emergency Contact: Hulsey,Jack Address: [address]          [place], [place] [address] [place] Home Phone: [phone] Relation: Son Secondary Emergency Contact: Moore,Carol  [place] Home Phone: [phone] Relation: Daughter  Advanced Directive information Does patient have an advance directive?: Yes, Type of Advance Directive: Living will;Out of facility DNR (pink MOST or yellow form);Healthcare Power of Attorney, Pre-existing out of facility DNR order (yellow form or pink MOST form): Yellow form placed in chart (order not valid for inpatient use);Pink MOST form placed in chart (order not valid for inpatient use), Does patient want to make changes to advanced directive?: No - Patient declined  Chief Complaint  Patient presents with  . Readmit To SNF    HPI:  81 year old female who has been residing on the assisted-living area of [facility] was moved to the skilled nursing facility area 07/26/2014 due to declining condition. She is not eating well or responding well. Confusion has increased. She has no CODE BLUE. Her son is her healthcare power of attorney. He agrees with continued care at [facility] as opposed to hospitalization. I discussed the issues with him at her bedside today.  There is another issue in this patient. In January 2016, there were pulmonary cultures done by Dr. Renne Crigler, who has been her primary care physician. Initial report showed a Klebsiella pneumonia present, which was treated with cefuroxime. Some of her bronchial rattle seemed clear that time. Continued culture for acid-fast smear and culture was done. The initial culture was collected 06/07/2014 and a report was  issued 07/20/2014 that the acid-fast culture was abnormal. A DNA probe to sort out the exact species of her acid-fast bacilli is in process. She is not running fevers. There is an occasional cough consistent with posterior pharyngeal secretions and possible recurrent mild aspirations. Her voice is weak. Dr. Renne Crigler as discussed the culture reports with infectious disease doctors at [facility]. They feel it is unlikely that this patient has a significant acid-fast infection. They recommended continued care at the facility at this time.   Patient is enrolled in hospice.  Past Medical History  Diagnosis Date  . Transient ischemic attack (TIA) 1994  . Atrial fibrillation   . Hypertension   . Dyslipidemia   . Gastritis   . Vitamin B12 deficiency   . Peptic ulcer disease   . Osteopenia   . Macular degeneration, right eye   . Arthritis     back, shoulders  . Cough 05/21/2011    pt states cough x 2 days  . Edema     left leg  . Hypoglycemia   . Urinary incontinence   . CAD 06/15/2009  . Sinoatrial node dysfunction 06/15/2009    Dual-chamber PM inserted 03/18/09 [employer] Zephyr XL DR dual-chamber, seriel [id number].  . Tachy-brady syndrome     Dual-chamber PM inserted 03/18/09 St. Jude Zephyr XL DR dual-chamber, seriel [id number].  Marland Kitchen CAD (coronary artery disease)     With LAD & RCA BMS, 2008 & LAD DES 2009 for restenosis. Both vessels patent by cath 03/18/09  . Atrial flutter   . Left heart failure   . Dyspnea     frequent  .  Rectal bleeding     Aggravated by a combo of coumadin & aspirin. D/C'ed aspirin.  . Chronic diastolic congestive heart failure   . Asthma with COPD   . GERD (gastroesophageal reflux disease)   . Irritable bowel   . Hip fracture, right   . Anorexia 08/27/12    Nearly 20 lbs weight loss over past year. Anorexia weight loss or major problems now & likely represent a slow progression/worsening of the terminal illness, perhaps GI. Probably time to start considering a change in  care plan from aggresive/diagnostic to palliative (Dr. Verdis PrimeHenry Smith 08/17/12)  . Coronary atherosclerosis of native coronary artery   . Chest discomfort 08/27/12    Nearly continuous.  . Poor appetite     Past Surgical History  Procedure Laterality Date  . Perforated ulcer    . Eye surgery      bilateral  . Pacemaker insertion  03/18/09    For symptomatic brady-tachy syndrome. [employer] Zephyr XL DR dual-chamber, seriel [id number]  . Hip surgery      broke hip and has pin in it  . Drug-eluting stent  [id number]    LAD & RCA BMS in 2008 & LAD DES in 2009 for restenosis. Both vessels patent by cath 03/18/09.  . Cardiac catheterization  03/18/09    With LAD & RCA BMS in 2008 & LAD DES in 2009 for restenosis.  . Varicose vein surgery    . Orif hip fracture Right     Pin in hip  . Cataract extraction Bilateral     Patient Care Team: Merri BrunetteWalter Pharr, MD as PCP - General (Internal Medicine) Lyn RecordsHenry W Smith, MD as Attending Physician (Cardiology)  History   Social History  . Marital Status: Widowed    Spouse Name: N/A  . Number of Children: N/A  . Years of Education: N/A   Occupational History  . Not on file.   Social History Main Topics  . Smoking status: Former Smoker    Quit date: 03/14/1996  . Smokeless tobacco: Never Used  . Alcohol Use: No  . Drug Use: No  . Sexual Activity: No   Other Topics Concern  . Not on file   Social History Narrative   Widow, lives in the assisted living division of [facility]     reports that she quit smoking about 18 years ago. She has never used smokeless tobacco. She reports that she does not drink alcohol or use illicit drugs.  Family History  Problem Relation Age of Onset  . CVA Mother   . Brain cancer Sister   . Heart attack Father   . Lymphoma Son   . CAD Son   . Testicular cancer Brother   . Alzheimer's disease Brother    Family Status  Relation Status Death Age  . Father Deceased 360    MI. Sudden death.  . Mother Deceased 4891      CVA  . Sister Deceased     Brain cancer  . Sister Deceased     Brain hemorrhage  . Son Alive     CAD, lymphoma  . Brother      Alzheimer's, testicular cancer    Immunization History  Administered Date(s) Administered  . Influenza-Unspecified 02/16/2014    Allergies  Allergen Reactions  . Ampicillin Other (See Comments)    Reaction Unknown   . Ciprofloxin Hcl [Ciprofloxacin Hcl] Other (See Comments)    Reaction Unknown, has received Levaquin 12/14  . Demerol Other (See Comments)  Reaction Unknown   . Sulfa Antibiotics Rash    Medications: Patient's Medications  New Prescriptions   No medications on file  Previous Medications   ACETAMINOPHEN (TYLENOL) 325 MG TABLET    Take 650 mg by mouth every 4 (four) hours as needed (pain/fever).   ALBUTEROL (PROVENTIL) (5 MG/ML) 0.5% NEBULIZER SOLUTION    Take 0.5 mLs (2.5 mg total) by nebulization every 6 (six) hours as needed for wheezing or shortness of breath.   ALBUTEROL (VENTOLIN HFA) 108 (90 BASE) MCG/ACT INHALER    Inhale 2 puffs into the lungs every 4 (four) hours as needed for wheezing or shortness of breath. Wait 1 minute between each puff.   ALUMINUM & MAGNESIUM HYDROXIDE-SIMETHICONE (MYLANTA) 500-450-40 MG/5ML SUSPENSION    Take 30 mLs by mouth every 4 (four) hours as needed for indigestion.   ATORVASTATIN (LIPITOR) 10 MG TABLET    Take 10 mg by mouth every evening.    BISACODYL (DULCOLAX) 10 MG SUPPOSITORY    Place 10 mg rectally daily as needed for moderate constipation (CONSTIPATION).    BUPRENORPHINE (BUTRANS) 10 MCG/HR PTWK PATCH    Place 10 mcg onto the skin once a week.   CALCIUM CARBONATE (OS-CAL) 600 MG TABS    Take 600 mg by mouth 2 (two) times daily.     CHOLECALCIFEROL (VITAMIN D3) 1000 UNITS CAPS    Take 1,000 Units by mouth 2 (two) times daily.    CYANOCOBALAMIN (VITAMIN B-12) 1000 MCG SUBL    Place 1 tablet under the tongue daily.   FLUTICASONE FUROATE-VILANTEROL (BREO ELLIPTA) 100-25 MCG/INH AEPB     Inhale 1 puff into the lungs daily.   FUROSEMIDE (LASIX) 40 MG TABLET    Take 40 mg by mouth daily.   GUAIFENESIN (MUCINEX) 600 MG 12 HR TABLET    Take 600 mg by mouth 2 (two) times daily.   MAGNESIUM HYDROXIDE (MILK OF MAGNESIA) 400 MG/5ML SUSPENSION    Take 30 mLs by mouth daily.   METOPROLOL SUCCINATE (TOPROL-XL) 50 MG 24 HR TABLET    Take 1 tablet (50 mg total) by mouth daily. Take with or immediately following a meal.   MIRTAZAPINE (REMERON) 15 MG TABLET    Take 15 mg by mouth at bedtime.   MORPHINE (MS CONTIN) 15 MG 12 HR TABLET    Take 1 tablet (15 mg total) by mouth every 12 (twelve) hours.   MULTIPLE VITAMINS-MINERALS (PRESERVISION AREDS) CAPS    Take 2 capsules by mouth 2 (two) times daily.    NITROGLYCERIN (NITRODUR - DOSED IN MG/24 HR) 0.4 MG/HR    Place 1 patch onto the skin every evening. Nitrate free interval from 4pm - 8pm every day   NITROGLYCERIN (NITROSTAT) 0.4 MG SL TABLET    Place 0.4 mg under the tongue every 5 (five) minutes as needed for chest pain (up to 3 doses).    OMEPRAZOLE (PRILOSEC) 20 MG CAPSULE    Take 1 capsule by mouth daily.   OXYBUTYNIN (DITROPAN-XL) 10 MG 24 HR TABLET    Take 10 mg by mouth at bedtime.   OXYCODONE (OXY IR/ROXICODONE) 5 MG IMMEDIATE RELEASE TABLET    Take 1 tablet (5 mg total) by mouth every 4 (four) hours as needed for severe pain.   POLYETHYLENE GLYCOL (MIRALAX / GLYCOLAX) PACKET    Take 17 g by mouth daily as needed for moderate constipation.    POTASSIUM CHLORIDE (K-DUR,KLOR-CON) 10 MEQ TABLET    Take 10 mEq by mouth daily.  SENNA-DOCUSATE (SENOKOT-S) 8.6-50 MG PER TABLET    Take 2 tablets by mouth at bedtime.   WARFARIN (COUMADIN) 3 MG TABLET    Take as directed by the coumadin clinic   WARFARIN (COUMADIN) 4 MG TABLET    Take as directed by the coumadin clinic  Modified Medications   No medications on file  Discontinued Medications   No medications on file    Review of Systems  Constitutional: Positive for activity change, appetite  change and fatigue. Negative for fever, chills, diaphoresis and unexpected weight change.  HENT: Positive for hearing loss. Negative for congestion, ear discharge, ear pain, nosebleeds, sore throat and tinnitus.   Eyes: Negative for photophobia, pain, discharge and redness.  Respiratory: Positive for shortness of breath. Negative for cough, wheezing and stridor.   Cardiovascular: Positive for leg swelling. Negative for chest pain and palpitations.       Trace RLE. 1+ LLE  Gastrointestinal: Positive for constipation. Negative for nausea, vomiting, abdominal pain, diarrhea and blood in stool.  Endocrine: Negative for polydipsia.  Genitourinary: Positive for frequency. Negative for dysuria, urgency, hematuria and flank pain.  Musculoskeletal: Positive for back pain. Negative for myalgias and neck pain.       Chronic lower back and the right knee pain.   Skin: Negative for rash.       Coccyx area small scabbed over area from previous pressure injury.   Allergic/Immunologic: Negative for environmental allergies.  Neurological: Positive for weakness. Negative for dizziness, tremors, seizures and headaches.  Hematological: Bruises/bleeds easily.  Psychiatric/Behavioral: Negative for suicidal ideas and hallucinations. The patient is not nervous/anxious.     Filed Vitals:   07/30/14 1355  BP: 98/60  Pulse: 70  Temp: 98.2 F (36.8 C)  Resp: 18  Height: 5\' 1"  (1.549 m)  Weight: 104 lb (47.174 kg)   Body mass index is 19.66 kg/(m^2).  Physical Exam  Constitutional: She is oriented to person, place, and time. She appears well-developed and well-nourished. No distress.  HENT:  Head: Normocephalic and atraumatic.  Right Ear: External ear normal.  Left Ear: External ear normal.  Mouth/Throat: Oropharynx is clear and moist. No oropharyngeal exudate.  Eyes: Conjunctivae and EOM are normal. Pupils are equal, round, and reactive to light. Right eye exhibits no discharge. Left eye exhibits no  discharge. No scleral icterus.  Neck: Normal range of motion. Neck supple. No JVD present. No tracheal deviation present. No thyromegaly present.  Cardiovascular: Normal rate.   Murmur heard. Pulmonary/Chest: No stridor. No respiratory distress. She has no wheezes. She has no rales. She exhibits no tenderness.  Abdominal: She exhibits no distension. There is no tenderness. There is no rebound and no guarding.  Musculoskeletal: Normal range of motion. She exhibits edema and tenderness.  BLR trace edema. Chronic back pain and the right knee pain. No longer ambulates.  Lymphadenopathy:    She has no cervical adenopathy.  Neurological: She is alert and oriented to person, place, and time. She has normal reflexes. No cranial nerve deficit. She exhibits normal muscle tone. Coordination normal.  Skin: Skin is warm and dry. No rash noted. She is not diaphoretic. No erythema. No pallor.  Coccyx area small scabbed over area from previous pressure injury-healed. Small mirror imaging pressure ulcers in the intergluteal cleft-not infected.   Psychiatric: She has a normal mood and affect. Her behavior is normal. Judgment and thought content normal.     Labs reviewed: Office Visit on 05/03/2014  Component Date Value Ref Range Status  .  Date Time Interrogation Session 05/03/2014 [id number]   Final  . Pulse Generator Manufacturer 05/03/2014 [employer]   Final  . Pulse Gen Model 05/03/2014 5826 Zephyr XL DR   Final  . Pulse Gen Serial Number 05/03/2014 [id number]   Final  . RV Sense Sensitivity 05/03/2014 2.0   Final  . RA Pace Amplitude 05/03/2014 2.00   Final  . RV Pace PulseWidth 05/03/2014 0.5   Final  . RV Pace Amplitude 05/03/2014 2.50   Final  . RA Impedance 05/03/2014 424   Final  . RA Amplitude 05/03/2014 1   Final  . RA Pacing Amplitude 05/03/2014 0.75   Final  . RA Pacing PulseWidth 05/03/2014 0.5   Final  . RV IMPEDANCE 05/03/2014 387   Final  . RV Amplitude 05/03/2014 12*  Final   . RV Pacing Amplitude 05/03/2014 0.75   Final  . RV Pacing PulseWidth 05/03/2014 0.5   Final  . Battery Status 05/03/2014 Unknown   Final  . Battery Longevity 05/03/2014 6.50 - >10 years   Final  . Battery Voltage 05/03/2014 2.78   Final  . Battery Impedance 05/03/2014 1000*  Final  . Eval Rhythm 05/03/2014 SB 54 w/ PACs   Final  . Miscellaneous Comment 05/03/2014    Final                   Value:Pacemaker check in clinic. Normal device function. Thresholds, sensing, impedances consistent with previous measurements. Device programmed to maximize longevity. 15,001 mode switches-- longest 4:53:34--- peak A rate 549 + warfarin. No high ventricular  rates noted. Device programmed at appropriate safety margins. Histogram distribution appropriate for patient activity level. Device programmed to optimize intrinsic conduction. Estimated longevity 6.5->10 years. Plan to follow in device clinic in 6  months, ROV with JA in 12 months.     Assessment/Plan  1. Failure to thrive in adult Current failure to thrive is most likely related to her COPD. She has atherosclerotic heart disease. Mental status is deteriorating. She has a DO NOT RESUSCITATE, living well, durable healthcare power of attorney, most form, and has requested no tube feeding and no intubation. I have advised her son that her prognosis is very poor, but we do not know the exact timing of when she will expire. She continues to take in small quantities, spoonfuls, of fluids.  2. Chronic atrial fibrillation Rate controlled. Anticoagulated.  3. Chronic diastolic CHF (congestive heart failure) Does not appear to be in active heart failure at this time. We believe she is compensated.  4. Essential hypertension Controlled  5. Loss of weight Caused by very poor intake over the last several weeks.

## 2014-08-02 ENCOUNTER — Telehealth: Payer: Self-pay | Admitting: Internal Medicine

## 2014-08-02 NOTE — Telephone Encounter (Signed)
New message      Pt is in hospice and has a pacemaker.  Son want to talk to Dr Johney FrameAllred to see if her pacemaker needs to be "turned off".

## 2014-08-02 NOTE — Telephone Encounter (Signed)
Spoke with son  No need to turn off PPM.  Gave him our condolences

## 2014-08-03 ENCOUNTER — Encounter: Payer: Self-pay | Admitting: Nurse Practitioner

## 2014-08-03 ENCOUNTER — Non-Acute Institutional Stay (SKILLED_NURSING_FACILITY): Payer: Medicare Other | Admitting: Nurse Practitioner

## 2014-08-03 DIAGNOSIS — M545 Low back pain, unspecified: Secondary | ICD-10-CM

## 2014-08-03 DIAGNOSIS — R5381 Other malaise: Secondary | ICD-10-CM | POA: Diagnosis not present

## 2014-08-03 DIAGNOSIS — I2510 Atherosclerotic heart disease of native coronary artery without angina pectoris: Secondary | ICD-10-CM

## 2014-08-03 DIAGNOSIS — G8929 Other chronic pain: Secondary | ICD-10-CM

## 2014-08-03 NOTE — Progress Notes (Signed)
Patient ID: [name], female   DOB: 03/20/1916, 81 y.o.   MRN: [id number]   Code Status: DNR.  No further labs  Allergies  Allergen Reactions  . Ampicillin Other (See Comments)    Reaction Unknown   . Ciprofloxin Hcl [Ciprofloxacin Hcl] Other (See Comments)    Reaction Unknown, has received Levaquin 12/14  . Demerol Other (See Comments)    Reaction Unknown   . Sulfa Antibiotics Rash    Chief Complaint  Patient presents with  . Medical Management of Chronic Issues    end of life care    HPI: Patient is a 81 y.o. female seen in the SNF at [facility] today for end of life care Problem List Items Addressed This Visit    Coronary atherosclerosis - Primary    Continue NGT patch      Relevant Medications   NITROGLYCERIN TRANSDERMAL TD   Acute exacerbation of chronic low back pain    Continue Butrans 24mcg/hr, Morphine  qid and q2h prn for pain management.       Relevant Medications   morphine (ROXANOL) 20 MG/ML concentrated solution   Buprenorphine 15 MCG/HR PTWK   Physical deconditioning    Comfort measures: Morphine, Lorazepam, NTG, and Neb.          Review of Systems:  Review of Systems  Constitutional: Positive for activity change, appetite change and fatigue. Negative for fever, chills, diaphoresis and unexpected weight change.  HENT: Positive for hearing loss. Negative for congestion, ear discharge, ear pain, nosebleeds, sore throat and tinnitus.   Eyes: Negative for photophobia, pain, discharge and redness.  Respiratory: Positive for shortness of breath. Negative for cough, wheezing and stridor.   Cardiovascular: Negative for chest pain, palpitations and leg swelling.  Gastrointestinal: Positive for constipation. Negative for nausea, vomiting, abdominal pain, diarrhea and blood in stool.  Endocrine: Negative for polydipsia.  Genitourinary: Positive for frequency. Negative for dysuria, urgency, hematuria and flank pain.  Musculoskeletal: Positive  for back pain. Negative for myalgias and neck pain.       Chronic lower back and the right knee pain.   Skin: Negative for rash.  Allergic/Immunologic: Negative for environmental allergies.  Neurological: Positive for weakness. Negative for dizziness, tremors, seizures and headaches.  Hematological: Bruises/bleeds easily.  Psychiatric/Behavioral: Negative for suicidal ideas and hallucinations. The patient is not nervous/anxious.      Past Medical History  Diagnosis Date  . Transient ischemic attack (TIA) 1994  . Atrial fibrillation   . Hypertension   . Dyslipidemia   . Gastritis   . Vitamin B12 deficiency   . Peptic ulcer disease   . Osteopenia   . Macular degeneration, right eye   . Arthritis     back, shoulders  . Cough 05/21/2011    pt states cough x 2 days  . Edema     left leg  . Hypoglycemia   . Urinary incontinence   . CAD 06/15/2009  . Sinoatrial node dysfunction 06/15/2009    Dual-chamber PM inserted 03/18/09 St[employer] Jude Zephyr XL DR dual-chamber, seriel [id number].  . Tachy-brady syndrome     Dual-chamber PM inserted 03/18/09 St. Jude Zephyr XL DR dual-chamber, seriel [id number].  Marland Kitchen CAD (coronary artery disease)     With LAD & RCA BMS, 2008 & LAD DES 2009 for restenosis. Both vessels patent by cath 03/18/09  . Atrial flutter   . Left heart failure   . Dyspnea     frequent  . Rectal bleeding  Aggravated by a combo of coumadin & aspirin. D/C'ed aspirin.  . Chronic diastolic congestive heart failure   . Asthma with COPD   . GERD (gastroesophageal reflux disease)   . Irritable bowel   . Hip fracture, right   . Anorexia 08/27/12    Nearly 20 lbs weight loss over past year. Anorexia weight loss or major problems now & likely represent a slow progression/worsening of the terminal illness, perhaps GI. Probably time to start considering a change in care plan from aggresive/diagnostic to palliative (Dr. Verdis Prime 08/17/12)  . Coronary atherosclerosis of native coronary artery   .  Chest discomfort 08/27/12    Nearly continuous.  . Poor appetite   . Failure to thrive in adult 07/30/2014   Past Surgical History  Procedure Laterality Date  . Perforated ulcer    . Eye surgery      bilateral  . Pacemaker insertion  03/18/09    For symptomatic brady-tachy syndrome. St[employer] Jude Zephyr XL DR dual-chamber, seriel [id number]  . Hip surgery      broke hip and has pin in it  . Drug-eluting stent  [id number]    LAD & RCA BMS in 2008 & LAD DES in 2009 for restenosis. Both vessels patent by cath 03/18/09.  . Cardiac catheterization  03/18/09    With LAD & RCA BMS in 2008 & LAD DES in 2009 for restenosis.  . Varicose vein surgery    . Orif hip fracture Right     Pin in hip  . Cataract extraction Bilateral    Social History:   reports that she quit smoking about 18 years ago. She has never used smokeless tobacco. She reports that she does not drink alcohol or use illicit drugs.  Family History  Problem Relation Age of Onset  . CVA Mother   . Brain cancer Sister   . Heart attack Father   . Lymphoma Son   . CAD Son   . Testicular cancer Brother   . Alzheimer's disease Brother     Medications: Patient's Medications  New Prescriptions   No medications on file  Previous Medications   ALBUTEROL (PROVENTIL) (5 MG/ML) 0.5% NEBULIZER SOLUTION    Take 0.5 mLs (2.5 mg total) by nebulization every 6 (six) hours as needed for wheezing or shortness of breath.   BUPRENORPHINE 15 MCG/HR PTWK    Place onto the skin.   LORAZEPAM (ATIVAN) 0.5 MG TABLET    Take 0.5 mg by mouth 2 (two) times daily. q4h prn   MORPHINE (ROXANOL) 20 MG/ML CONCENTRATED SOLUTION    Take 6 mg by mouth 4 (four) times daily. q2h prn   NITROGLYCERIN TRANSDERMAL TD    Place onto the skin.  Modified Medications   No medications on file  Discontinued Medications   ACETAMINOPHEN (TYLENOL) 325 MG TABLET    Take 650 mg by mouth every 4 (four) hours as needed (pain/fever).   ALBUTEROL (VENTOLIN HFA) 108 (90 BASE) MCG/ACT  INHALER    Inhale 2 puffs into the lungs every 4 (four) hours as needed for wheezing or shortness of breath. Wait 1 minute between each puff.   ALUMINUM & MAGNESIUM HYDROXIDE-SIMETHICONE (MYLANTA) 500-450-40 MG/5ML SUSPENSION    Take 30 mLs by mouth every 4 (four) hours as needed for indigestion.   ATORVASTATIN (LIPITOR) 10 MG TABLET    Take 10 mg by mouth every evening.    BISACODYL (DULCOLAX) 10 MG SUPPOSITORY    Place 10 mg rectally daily as needed for moderate  constipation (CONSTIPATION).    BUPRENORPHINE (BUTRANS) 10 MCG/HR PTWK PATCH    Place 10 mcg onto the skin once a week.   CALCIUM CARBONATE (OS-CAL) 600 MG TABS    Take 600 mg by mouth 2 (two) times daily.     CHOLECALCIFEROL (VITAMIN D3) 1000 UNITS CAPS    Take 1,000 Units by mouth 2 (two) times daily.    CYANOCOBALAMIN (VITAMIN B-12) 1000 MCG SUBL    Place 1 tablet under the tongue daily.   FLUTICASONE FUROATE-VILANTEROL (BREO ELLIPTA) 100-25 MCG/INH AEPB    Inhale 1 puff into the lungs daily.   FUROSEMIDE (LASIX) 40 MG TABLET    Take 40 mg by mouth daily.   GUAIFENESIN (MUCINEX) 600 MG 12 HR TABLET    Take 600 mg by mouth 2 (two) times daily.   MAGNESIUM HYDROXIDE (MILK OF MAGNESIA) 400 MG/5ML SUSPENSION    Take 30 mLs by mouth daily.   METOPROLOL SUCCINATE (TOPROL-XL) 50 MG 24 HR TABLET    Take 1 tablet (50 mg total) by mouth daily. Take with or immediately following a meal.   MIRTAZAPINE (REMERON) 15 MG TABLET    Take 15 mg by mouth at bedtime.   MORPHINE (MS CONTIN) 15 MG 12 HR TABLET    Take 1 tablet (15 mg total) by mouth every 12 (twelve) hours.   MULTIPLE VITAMINS-MINERALS (PRESERVISION AREDS) CAPS    Take 2 capsules by mouth 2 (two) times daily.    NITROGLYCERIN (NITRODUR - DOSED IN MG/24 HR) 0.4 MG/HR    Place 1 patch onto the skin every evening. Nitrate free interval from 4pm - 8pm every day   NITROGLYCERIN (NITROSTAT) 0.4 MG SL TABLET    Place 0.4 mg under the tongue every 5 (five) minutes as needed for chest pain (up to 3  doses).    OMEPRAZOLE (PRILOSEC) 20 MG CAPSULE    Take 1 capsule by mouth daily.   OXYBUTYNIN (DITROPAN-XL) 10 MG 24 HR TABLET    Take 10 mg by mouth at bedtime.   OXYCODONE (OXY IR/ROXICODONE) 5 MG IMMEDIATE RELEASE TABLET    Take 1 tablet (5 mg total) by mouth every 4 (four) hours as needed for severe pain.   POLYETHYLENE GLYCOL (MIRALAX / GLYCOLAX) PACKET    Take 17 g by mouth daily as needed for moderate constipation.    POTASSIUM CHLORIDE (K-DUR,KLOR-CON) 10 MEQ TABLET    Take 10 mEq by mouth daily.   SENNA-DOCUSATE (SENOKOT-S) 8.6-50 MG PER TABLET    Take 2 tablets by mouth at bedtime.   WARFARIN (COUMADIN) 3 MG TABLET    Take as directed by the coumadin clinic   WARFARIN (COUMADIN) 4 MG TABLET    Take as directed by the coumadin clinic     Physical Exam: Physical Exam  Constitutional: She is oriented to person, place, and time. She appears well-developed and well-nourished. No distress.  HENT:  Head: Normocephalic and atraumatic.  Right Ear: External ear normal.  Left Ear: External ear normal.  Mouth/Throat: Oropharynx is clear and moist. No oropharyngeal exudate.  Eyes: Conjunctivae and EOM are normal. Pupils are equal, round, and reactive to light. Right eye exhibits no discharge. Left eye exhibits no discharge. No scleral icterus.  Neck: Normal range of motion. Neck supple. No JVD present. No tracheal deviation present. No thyromegaly present.  Cardiovascular: Normal rate.   Murmur heard. Pulmonary/Chest: No stridor. No respiratory distress. She has no wheezes. She has no rales. She exhibits no tenderness.  Abdominal: She exhibits  no distension. There is no tenderness. There is no rebound and no guarding.  Musculoskeletal: Normal range of motion. She exhibits tenderness. She exhibits no edema.  Chronic back pain and the right knee pain.   Lymphadenopathy:    She has no cervical adenopathy.  Neurological: She is alert and oriented to person, place, and time. She has normal  reflexes. No cranial nerve deficit. She exhibits normal muscle tone. Coordination normal.  Skin: Skin is warm and dry. No rash noted. She is not diaphoretic. No erythema. No pallor.     Psychiatric: She has a normal mood and affect. Her behavior is normal. Judgment and thought content normal.   Filed Vitals:   08/03/14 1350  BP: 120/73  Pulse: 90  Temp: 97.5 F (36.4 C)  TempSrc: Tympanic  Resp: 20      Labs reviewed: Basic Metabolic Panel:  Recent Labs  16/10/96 0320 02/28/14 1127 03/01/14 0420 03/26/14 04/15/14  NA 137 142 144 137 138  K 4.0 3.9 3.8 4.2 4.0  CL 100 103 106  --   --   CO2 --   --   GLUCOSE 135* 100* 79  --   --   BUN 30* CREATININE 0.69 0.65 0.67 0.5 0.7  CALCIUM 9.2 9.7 9.1  --   --   MG  --   --  2.2  --   --   PHOS  --   --  3.1  --   --   TSH  --   --  6.680* 6.12*  --    Liver Function Tests:  Recent Labs  09/13/13 1420 02/28/14 1802 03/01/14 0420 03/26/14 04/15/14  AST ALT ALKPHOS 72 78 78 90 85  BILITOT 0.3 0.4 0.4  --   --   PROT 6.8 6.7 6.6  --   --   ALBUMIN 3.3* 3.0* 2.8*  --   --    No results for input(s): LIPASE, AMYLASE in the last 8760 hours. No results for input(s): AMMONIA in the last 8760 hours. CBC:  Recent Labs  11/19/13 0320 02/28/14 1127 03/01/14 0420 03/25/14 04/15/14  WBC 13.3* 6.4 7.3 9.5 11.2  NEUTROABS 11.3* 4.2  --   --   --   HGB 10.7* 11.0* 11.2* 10.1* 11.0*  HCT 32.9* 34.4* 35.0* 31* 33*  MCV 92.7 93.2 93.8  --   --   PLT 170 195 215 250 172   Lipid Panel: No results for input(s): CHOL, HDL, LDLCALC, TRIG, CHOLHDL, LDLDIRECT in the last 8760 hours.  Past Procedures:  05/04/2013 EXAM: CHEST 2 VIEW IMPRESSION: Cardiomegaly and interstitial opacity at the bases suggest mild CHF with interstitial pulmonary edema. Pneumonia considered less likely.   05/11/2013 EXAM: PORTABLE CHEST - 1 VIEW IMPRESSION: Mild cardiomegaly with central pulmonary  vascular prominence. No segmental consolidation. Calcified tortuous aorta.  04/14/14 CXR  Borderline cardiomegaly without evident congestive heart failure. Basilar subsegmental atelectatic change but otherwise negative for focal pneumonia.   Assessment/Plan Coronary atherosclerosis Continue NGT patch   Acute exacerbation of chronic low back pain Continue Butrans 63mcg/hr, Morphine  qid and q2h prn for pain management.    Physical deconditioning Comfort measures: Morphine, Lorazepam, NTG, and Neb.      Family/ Staff Communication: observe the patient.   Goals of Care: SNF Hospice Service.  Labs/tests ordered: none

## 2014-08-03 NOTE — Assessment & Plan Note (Signed)
Continue Butrans 1115mcg/hr, Morphine 6mg  qid and q2h prn for pain management.

## 2014-08-03 NOTE — Assessment & Plan Note (Signed)
Continue NGT patch

## 2014-08-03 NOTE — Assessment & Plan Note (Signed)
Comfort measures: Morphine, Lorazepam, NTG, and Neb.

## 2014-08-13 DEATH — deceased

## 2015-08-30 IMAGING — CR DG ABDOMEN 1V
1 series · 1 of 1 positions shown · non-contrast
Comparison: 02/28/1949

CLINICAL DATA: Abdominal pain and constipation

EXAM:
ABDOMEN - 1 VIEW

[t abdomen supine]
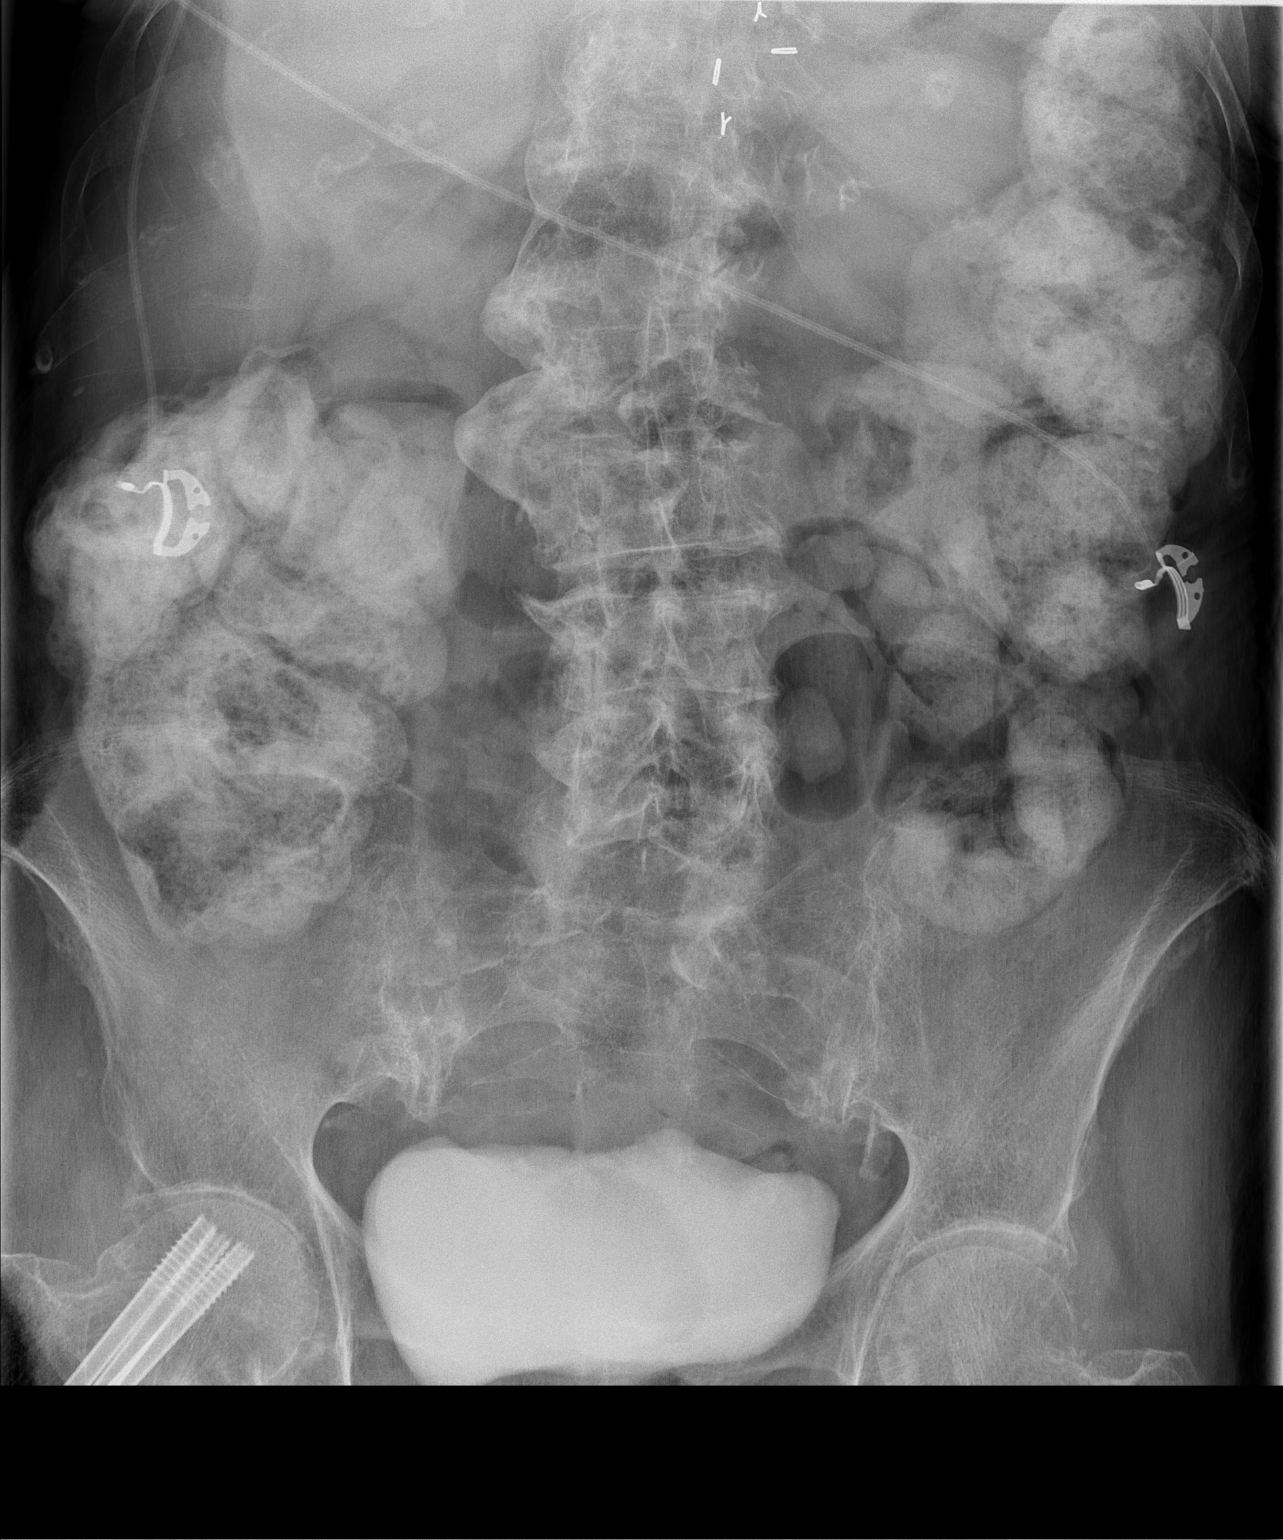

[1 of 1 positions shown; findings below may reference images not displayed]

FINDINGS: Scattered large and small bowel gas is noted. Contrast material is
noted throughout the colon from recent CT examination. The bladder
is also to well distended with contrast enhanced urine. Moderate
fecal material is noted within the colon. No obstructive changes are
seen. No free air is noted. Degenerative change of the lumbar spine
is noted. Postsurgical change in the right hip is seen.
IMPRESSION: Fecal material within the colon without obstructive change. The
overall appearance is stable from the prior study.

## 2015-09-01 IMAGING — CR DG ABDOMEN 1V
1 series · 1 of 1 positions shown · non-contrast
Comparison: March 02, 2014.

CLINICAL DATA: Constipation.

EXAM:
ABDOMEN - 1 VIEW

[t abdomen supine *]
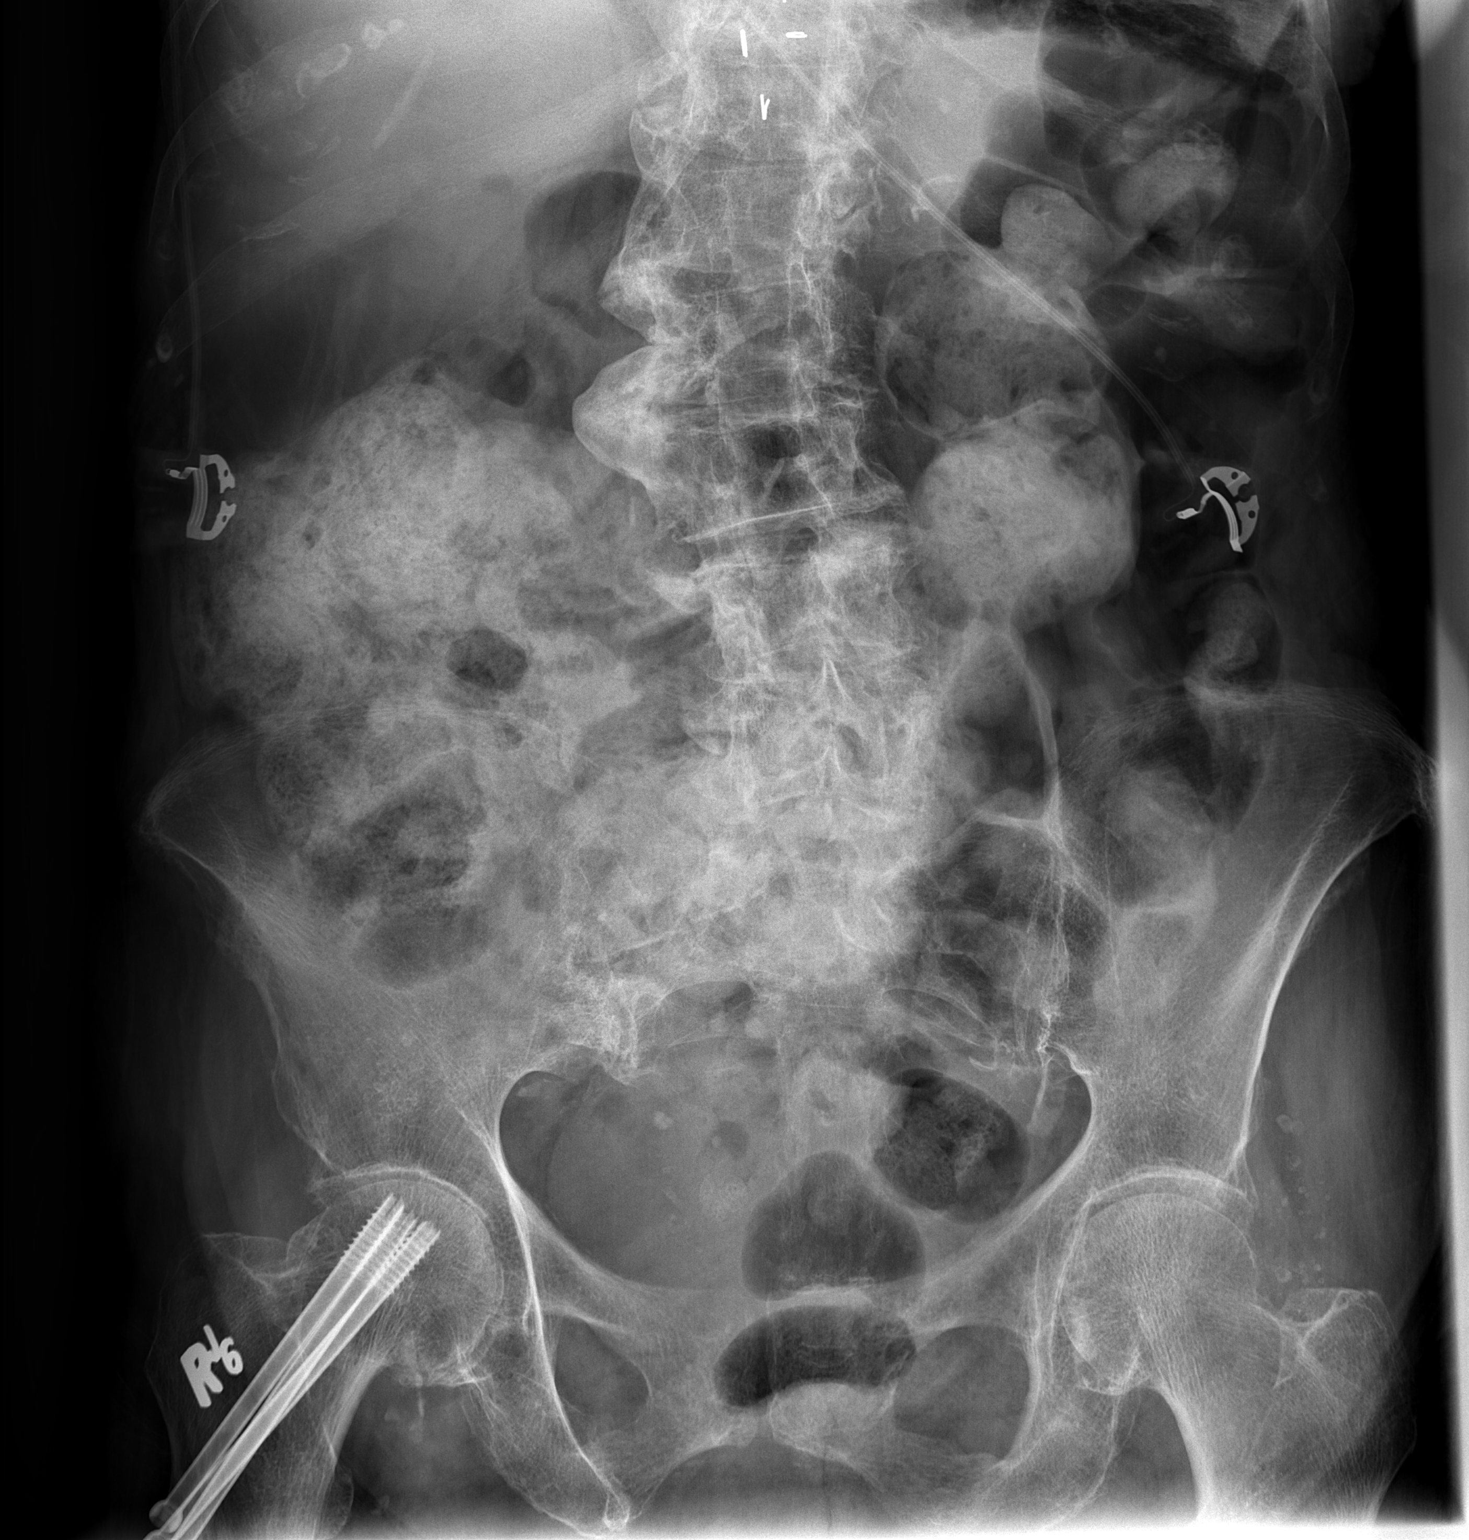

[1 of 1 positions shown; findings below may reference images not displayed]

FINDINGS: There remains a large amount of stool in the right and transverse
colon consistent with constipation the amount of stool seen in the
left colon and rectum is improved compared to prior exam. No small
bowel dilatation is noted. Postsurgical changes are noted in the
right hip as well as extensive degenerative changes in the lumbar
spine.
IMPRESSION: Decreased amount of stool is noted in the left colon, but there
remains a large amount of stool in the right and transverse colon
consistent with constipation.
